# Patient Record
Sex: Male | Born: 1960 | Race: White | Hispanic: No | Marital: Single | Smoking: Never smoker
Health system: Southern US, Community
[De-identification: ages and names within clinical notes are randomized; demographics above are authoritative.]

## PROBLEM LIST (undated history)

## (undated) DIAGNOSIS — E119 Type 2 diabetes mellitus without complications: Secondary | ICD-10-CM

## (undated) DIAGNOSIS — I499 Cardiac arrhythmia, unspecified: Secondary | ICD-10-CM

## (undated) DIAGNOSIS — F209 Schizophrenia, unspecified: Secondary | ICD-10-CM

## (undated) DIAGNOSIS — I10 Essential (primary) hypertension: Secondary | ICD-10-CM

## (undated) DIAGNOSIS — R251 Tremor, unspecified: Secondary | ICD-10-CM

## (undated) DIAGNOSIS — G4730 Sleep apnea, unspecified: Secondary | ICD-10-CM

## (undated) DIAGNOSIS — K219 Gastro-esophageal reflux disease without esophagitis: Secondary | ICD-10-CM

## (undated) DIAGNOSIS — F329 Major depressive disorder, single episode, unspecified: Secondary | ICD-10-CM

## (undated) DIAGNOSIS — F32A Depression, unspecified: Secondary | ICD-10-CM

## (undated) DIAGNOSIS — E7800 Pure hypercholesterolemia, unspecified: Secondary | ICD-10-CM

## (undated) HISTORY — DX: Essential (primary) hypertension: I10

## (undated) HISTORY — DX: Schizophrenia, unspecified: F20.9

## (undated) HISTORY — DX: Pure hypercholesterolemia, unspecified: E78.00

## (undated) HISTORY — DX: Type 2 diabetes mellitus without complications: E11.9

## (undated) HISTORY — DX: Tremor, unspecified: R25.1

## (undated) HISTORY — PX: CARDIOVERSION: SHX1299

## (undated) HISTORY — DX: Cardiac arrhythmia, unspecified: I49.9

---

## 2004-08-27 ENCOUNTER — Ambulatory Visit: Payer: Self-pay | Admitting: Family Medicine

## 2004-09-01 ENCOUNTER — Ambulatory Visit: Payer: Self-pay | Admitting: Family Medicine

## 2004-10-02 ENCOUNTER — Ambulatory Visit: Payer: Self-pay | Admitting: Family Medicine

## 2004-11-01 ENCOUNTER — Ambulatory Visit: Payer: Self-pay | Admitting: Family Medicine

## 2007-02-25 ENCOUNTER — Other Ambulatory Visit: Payer: Self-pay

## 2007-02-25 ENCOUNTER — Ambulatory Visit: Payer: Self-pay | Admitting: Internal Medicine

## 2007-03-07 ENCOUNTER — Ambulatory Visit: Payer: Self-pay | Admitting: Internal Medicine

## 2008-01-30 ENCOUNTER — Ambulatory Visit: Payer: Self-pay | Admitting: Internal Medicine

## 2009-07-02 ENCOUNTER — Ambulatory Visit: Payer: Self-pay | Admitting: Internal Medicine

## 2010-07-30 ENCOUNTER — Ambulatory Visit: Payer: Self-pay | Admitting: Internal Medicine

## 2011-04-10 ENCOUNTER — Emergency Department: Payer: Self-pay | Admitting: Emergency Medicine

## 2011-10-27 ENCOUNTER — Inpatient Hospital Stay: Payer: Self-pay | Admitting: Internal Medicine

## 2011-10-27 LAB — COMPREHENSIVE METABOLIC PANEL
Albumin: 3.7 g/dL (ref 3.4–5.0)
Bilirubin,Total: 0.7 mg/dL (ref 0.2–1.0)
Glucose: 114 mg/dL — ABNORMAL HIGH (ref 65–99)
Osmolality: 283 (ref 275–301)
Potassium: 3.5 mmol/L (ref 3.5–5.1)
SGOT(AST): 33 U/L (ref 15–37)
SGPT (ALT): 25 U/L
Sodium: 138 mmol/L (ref 136–145)
Total Protein: 7.7 g/dL (ref 6.4–8.2)

## 2011-10-27 LAB — DRUG SCREEN, URINE
Amphetamines, Ur Screen: NEGATIVE (ref ?–1000)
Barbiturates, Ur Screen: NEGATIVE (ref ?–200)
Benzodiazepine, Ur Scrn: POSITIVE (ref ?–200)
Cocaine Metabolite,Ur ~~LOC~~: NEGATIVE (ref ?–300)
MDMA (Ecstasy)Ur Screen: NEGATIVE (ref ?–500)
Opiate, Ur Screen: NEGATIVE (ref ?–300)
Phencyclidine (PCP) Ur S: NEGATIVE (ref ?–25)
Tricyclic, Ur Screen: POSITIVE (ref ?–1000)

## 2011-10-27 LAB — URINALYSIS, COMPLETE
Blood: NEGATIVE
Glucose,UR: NEGATIVE mg/dL (ref 0–75)
Granular Cast: 10
Leukocyte Esterase: NEGATIVE
Ph: 5 (ref 4.5–8.0)
Protein: 100
Specific Gravity: 1.032 (ref 1.003–1.030)
Squamous Epithelial: 2
Transitional Epi: 3
WBC UR: 9 /HPF (ref 0–5)

## 2011-10-27 LAB — CBC
HCT: 36.8 % — ABNORMAL LOW (ref 40.0–52.0)
HGB: 12.4 g/dL — ABNORMAL LOW (ref 13.0–18.0)
MCH: 32.2 pg (ref 26.0–34.0)
MCV: 95 fL (ref 80–100)
RBC: 3.86 10*6/uL — ABNORMAL LOW (ref 4.40–5.90)
RDW: 13.6 % (ref 11.5–14.5)

## 2011-10-27 LAB — VALPROIC ACID LEVEL: Valproic Acid: 119 ug/mL — ABNORMAL HIGH

## 2011-10-28 LAB — BASIC METABOLIC PANEL
BUN: 28 mg/dL — ABNORMAL HIGH (ref 7–18)
Calcium, Total: 8.2 mg/dL — ABNORMAL LOW (ref 8.5–10.1)
Chloride: 106 mmol/L (ref 98–107)
Creatinine: 2.86 mg/dL — ABNORMAL HIGH (ref 0.60–1.30)
EGFR (African American): 28 — ABNORMAL LOW
EGFR (Non-African Amer.): 24 — ABNORMAL LOW
Glucose: 99 mg/dL (ref 65–99)
Osmolality: 287 (ref 275–301)
Potassium: 3.4 mmol/L — ABNORMAL LOW (ref 3.5–5.1)
Sodium: 141 mmol/L (ref 136–145)

## 2011-10-28 LAB — CBC WITH DIFFERENTIAL/PLATELET
Basophil #: 0 10*3/uL (ref 0.0–0.1)
Basophil %: 0.5 %
Eosinophil %: 1.8 %
HCT: 33.2 % — ABNORMAL LOW (ref 40.0–52.0)
HGB: 11.2 g/dL — ABNORMAL LOW (ref 13.0–18.0)
Lymphocyte #: 0.9 10*3/uL — ABNORMAL LOW (ref 1.0–3.6)
Lymphocyte %: 17.6 %
MCH: 31.9 pg (ref 26.0–34.0)
MCHC: 33.6 g/dL (ref 32.0–36.0)
Monocyte #: 0.8 x10 3/mm (ref 0.2–1.0)
Monocyte %: 15.1 %
Neutrophil #: 3.5 10*3/uL (ref 1.4–6.5)
Neutrophil %: 65 %
Platelet: 83 10*3/uL — ABNORMAL LOW (ref 150–440)

## 2011-10-28 LAB — HEMOGLOBIN A1C: Hemoglobin A1C: 5.8 % (ref 4.2–6.3)

## 2011-10-29 LAB — CBC WITH DIFFERENTIAL/PLATELET
Basophil %: 0.4 %
HCT: 30.8 % — ABNORMAL LOW (ref 40.0–52.0)
HGB: 10.6 g/dL — ABNORMAL LOW (ref 13.0–18.0)
Lymphocyte #: 0.8 10*3/uL — ABNORMAL LOW (ref 1.0–3.6)
Lymphocyte %: 23.7 %
MCH: 32.6 pg (ref 26.0–34.0)
MCV: 95 fL (ref 80–100)
Monocyte %: 15.5 %
Neutrophil #: 1.9 10*3/uL (ref 1.4–6.5)
Neutrophil %: 57.9 %
Platelet: 65 10*3/uL — ABNORMAL LOW (ref 150–440)
RDW: 13.9 % (ref 11.5–14.5)

## 2011-10-29 LAB — AMMONIA: Ammonia, Plasma: <25 mcmol/L (ref 11–32)

## 2011-10-29 LAB — COMPREHENSIVE METABOLIC PANEL
Albumin: 3 g/dL — ABNORMAL LOW (ref 3.4–5.0)
Alkaline Phosphatase: 90 U/L (ref 50–136)
Calcium, Total: 8.4 mg/dL — ABNORMAL LOW (ref 8.5–10.1)
EGFR (African American): 39 — ABNORMAL LOW
Osmolality: 280 (ref 275–301)
Potassium: 3.4 mmol/L — ABNORMAL LOW (ref 3.5–5.1)
SGOT(AST): 51 U/L — ABNORMAL HIGH (ref 15–37)
Sodium: 140 mmol/L (ref 136–145)
Total Protein: 6.2 g/dL — ABNORMAL LOW (ref 6.4–8.2)

## 2011-10-30 ENCOUNTER — Ambulatory Visit: Payer: Self-pay | Admitting: Internal Medicine

## 2011-10-30 LAB — BASIC METABOLIC PANEL
BUN: 11 mg/dL (ref 7–18)
Calcium, Total: 8.5 mg/dL (ref 8.5–10.1)
Chloride: 106 mmol/L (ref 98–107)
Co2: 24 mmol/L (ref 21–32)
Creatinine: 1.86 mg/dL — ABNORMAL HIGH (ref 0.60–1.30)
EGFR (African American): 47 — ABNORMAL LOW
EGFR (Non-African Amer.): 41 — ABNORMAL LOW
Potassium: 3.5 mmol/L (ref 3.5–5.1)
Sodium: 141 mmol/L (ref 136–145)

## 2011-10-30 LAB — CBC WITH DIFFERENTIAL/PLATELET
Basophil #: 0 10*3/uL (ref 0.0–0.1)
Basophil %: 0.5 %
Eosinophil #: 0.1 10*3/uL (ref 0.0–0.7)
HCT: 32.3 % — ABNORMAL LOW (ref 40.0–52.0)
HGB: 11 g/dL — ABNORMAL LOW (ref 13.0–18.0)
MCH: 32.6 pg (ref 26.0–34.0)
MCV: 96 fL (ref 80–100)
Monocyte %: 17.7 %
Neutrophil %: 56 %
Platelet: 78 10*3/uL — ABNORMAL LOW (ref 150–440)
RBC: 3.38 10*6/uL — ABNORMAL LOW (ref 4.40–5.90)
RDW: 14 % (ref 11.5–14.5)

## 2011-10-31 LAB — CBC WITH DIFFERENTIAL/PLATELET
Comment - H1-Com1: NORMAL
Comment - H1-Com2: NORMAL
HGB: 11.3 g/dL — ABNORMAL LOW (ref 13.0–18.0)
Lymphocytes: 14 %
MCH: 32.7 pg (ref 26.0–34.0)
MCHC: 34.2 g/dL (ref 32.0–36.0)
Monocytes: 9 %
Platelet: 80 10*3/uL — ABNORMAL LOW (ref 150–440)
RDW: 13.7 % (ref 11.5–14.5)
WBC: 4.4 10*3/uL (ref 3.8–10.6)

## 2011-10-31 LAB — COMPREHENSIVE METABOLIC PANEL
Albumin: 3.3 g/dL — ABNORMAL LOW (ref 3.4–5.0)
Anion Gap: 10 (ref 7–16)
Calcium, Total: 8.8 mg/dL (ref 8.5–10.1)
Creatinine: 1.88 mg/dL — ABNORMAL HIGH (ref 0.60–1.30)
Potassium: 4.1 mmol/L (ref 3.5–5.1)
SGOT(AST): 58 U/L — ABNORMAL HIGH (ref 15–37)
Sodium: 141 mmol/L (ref 136–145)
Total Protein: 6.9 g/dL (ref 6.4–8.2)

## 2011-10-31 LAB — IRON AND TIBC
Iron Bind.Cap.(Total): 301 ug/dL (ref 250–450)
Iron Saturation: 21 %
Iron: 63 ug/dL — ABNORMAL LOW (ref 65–175)
Unbound Iron-Bind.Cap.: 238 ug/dL

## 2011-10-31 LAB — FIBRINOGEN: Fibrinogen: 247 mg/dL (ref 210–470)

## 2011-10-31 LAB — PROTIME-INR
INR: 1.3
Prothrombin Time: 16.9 secs — ABNORMAL HIGH (ref 11.5–14.7)

## 2011-10-31 LAB — APTT: Activated PTT: 26.7 secs (ref 23.6–35.9)

## 2011-10-31 LAB — FIBRIN DEGRADATION PROD.(ARMC ONLY): Fibrin Degradation Prod.: <10 (ref 2.1–7.7)

## 2011-11-01 LAB — URINALYSIS, COMPLETE
Bilirubin,UR: NEGATIVE
Nitrite: POSITIVE
Ph: 5 (ref 4.5–8.0)
Protein: 30
RBC,UR: 68 /HPF (ref 0–5)

## 2011-11-01 LAB — SEDIMENTATION RATE: Erythrocyte Sed Rate: 20 mm/hr (ref 0–20)

## 2011-11-02 ENCOUNTER — Ambulatory Visit: Payer: Self-pay | Admitting: Internal Medicine

## 2011-11-02 LAB — CBC WITH DIFFERENTIAL/PLATELET
Basophil #: 0 10*3/uL (ref 0.0–0.1)
Basophil %: 0.6 %
Eosinophil %: 3.5 %
HCT: 30.6 % — ABNORMAL LOW (ref 40.0–52.0)
HGB: 10.4 g/dL — ABNORMAL LOW (ref 13.0–18.0)
Lymphocyte %: 15.3 %
MCV: 96 fL (ref 80–100)
Monocyte %: 16.9 %
Neutrophil #: 2.8 10*3/uL (ref 1.4–6.5)
RBC: 3.19 10*6/uL — ABNORMAL LOW (ref 4.40–5.90)
WBC: 4.3 10*3/uL (ref 3.8–10.6)

## 2011-11-02 LAB — COMPREHENSIVE METABOLIC PANEL
Alkaline Phosphatase: 129 U/L (ref 50–136)
Bilirubin,Total: 0.7 mg/dL (ref 0.2–1.0)
Chloride: 108 mmol/L — ABNORMAL HIGH (ref 98–107)
Co2: 25 mmol/L (ref 21–32)
Creatinine: 2 mg/dL — ABNORMAL HIGH (ref 0.60–1.30)
EGFR (African American): 43 — ABNORMAL LOW
Glucose: 106 mg/dL — ABNORMAL HIGH (ref 65–99)
Osmolality: 279 (ref 275–301)
SGOT(AST): 82 U/L — ABNORMAL HIGH (ref 15–37)
SGPT (ALT): 90 U/L — ABNORMAL HIGH

## 2011-11-03 LAB — CBC WITH DIFFERENTIAL/PLATELET
Basophil #: 0 10*3/uL (ref 0.0–0.1)
Eosinophil #: 0.1 10*3/uL (ref 0.0–0.7)
HCT: 30.4 % — ABNORMAL LOW (ref 40.0–52.0)
HGB: 10.5 g/dL — ABNORMAL LOW (ref 13.0–18.0)
Lymphocyte #: 1 10*3/uL (ref 1.0–3.6)
MCV: 96 fL (ref 80–100)
Neutrophil #: 2.4 10*3/uL (ref 1.4–6.5)
Neutrophil %: 51.2 %
Platelet: 99 10*3/uL — ABNORMAL LOW (ref 150–440)
RBC: 3.18 10*6/uL — ABNORMAL LOW (ref 4.40–5.90)
WBC: 4.6 10*3/uL (ref 3.8–10.6)

## 2011-11-03 LAB — COMPREHENSIVE METABOLIC PANEL
Albumin: 2.8 g/dL — ABNORMAL LOW (ref 3.4–5.0)
Anion Gap: 8 (ref 7–16)
Calcium, Total: 8 mg/dL — ABNORMAL LOW (ref 8.5–10.1)
Co2: 25 mmol/L (ref 21–32)
EGFR (African American): 45 — ABNORMAL LOW
EGFR (Non-African Amer.): 39 — ABNORMAL LOW
Potassium: 3.9 mmol/L (ref 3.5–5.1)
SGOT(AST): 69 U/L — ABNORMAL HIGH (ref 15–37)
SGPT (ALT): 90 U/L — ABNORMAL HIGH
Sodium: 140 mmol/L (ref 136–145)

## 2011-11-18 ENCOUNTER — Ambulatory Visit: Payer: Self-pay | Admitting: Internal Medicine

## 2011-11-18 LAB — CBC CANCER CENTER
Basophil %: 0.4 %
Eosinophil %: 4 %
HCT: 36.2 % — ABNORMAL LOW (ref 40.0–52.0)
HGB: 11.9 g/dL — ABNORMAL LOW (ref 13.0–18.0)
Lymphocyte #: 1.2 x10 3/mm (ref 1.0–3.6)
MCH: 31.1 pg (ref 26.0–34.0)
MCV: 95 fL (ref 80–100)
Monocyte #: 1 x10 3/mm (ref 0.2–1.0)
Monocyte %: 17.3 %
Neutrophil #: 3.1 x10 3/mm (ref 1.4–6.5)

## 2011-11-20 ENCOUNTER — Emergency Department: Payer: Self-pay | Admitting: *Deleted

## 2011-12-03 ENCOUNTER — Ambulatory Visit: Payer: Self-pay | Admitting: Internal Medicine

## 2013-06-15 ENCOUNTER — Ambulatory Visit: Payer: Self-pay | Admitting: Neurology

## 2013-06-19 ENCOUNTER — Encounter: Payer: Self-pay | Admitting: Neurology

## 2013-06-19 ENCOUNTER — Ambulatory Visit (INDEPENDENT_AMBULATORY_CARE_PROVIDER_SITE_OTHER): Payer: Medicare Other | Admitting: Neurology

## 2013-06-19 VITALS — BP 106/74 | HR 68 | Resp 16 | Ht 72.0 in | Wt 282.2 lb

## 2013-06-19 DIAGNOSIS — G219 Secondary parkinsonism, unspecified: Secondary | ICD-10-CM

## 2013-06-19 DIAGNOSIS — F209 Schizophrenia, unspecified: Secondary | ICD-10-CM

## 2013-06-19 DIAGNOSIS — G2401 Drug induced subacute dyskinesia: Secondary | ICD-10-CM

## 2013-06-19 DIAGNOSIS — G212 Secondary parkinsonism due to other external agents: Secondary | ICD-10-CM | POA: Insufficient documentation

## 2013-06-19 DIAGNOSIS — T43591A Poisoning by other antipsychotics and neuroleptics, accidental (unintentional), initial encounter: Secondary | ICD-10-CM

## 2013-06-19 DIAGNOSIS — T43505A Adverse effect of unspecified antipsychotics and neuroleptics, initial encounter: Secondary | ICD-10-CM

## 2013-06-19 NOTE — Progress Notes (Signed)
Lawrence Johnston was seen today in the movement disorders clinic for neurologic consultation at the request of Dr. Melrose Nakayama.  His PCP is  SPARKS,JEFFREY D, MD.  The consultation is for the evaluation of tremor and to see if he would be a DBS candidate.  The records that were made available to me were reviewed.   A caregiver from a group home comes with him.   The pt reports that tremor started several years ago (at least since 2006), per a caregiver that comes with him today.  Per the caregiver, he was placed on a "psychotropic" medication that caused tremor and even though that medication was d/c, the tremor persisted and actually has gotten worse with time.  He is having trouble eating and is spilling food on himself because of the tremor.  According to Dr. Melrose Nakayama records, the pt has been on abilify, saphril and seroquel and is currently on haloperidol.  He is also on VPA, carbidopa/levodopa tid, requip at bedtime, and 2 beta blockers (metoprolol and propranolol).    Specific Symptoms:  Tremor: yes Voice: no changes Sleep:   Vivid Dreams:  yes  Acting out dreams:  yes per caregiver (screams out) Wet Pillows: no Postural symptoms:  no  Falls?  no Bradykinesia symptoms: no bradykinesia noted Loss of smell:  no Loss of taste:  no Urinary Incontinence:  no Difficulty Swallowing:  no Handwriting, micrographia: no (gotten messy because of tremor) Trouble with ADL's:  no  Trouble buttoning clothing: no Depression:  no but admits to anxiety Memory changes:  no Hallucinations:  yes (black figures).  Admits to long standing auditory hallucinations  visual distortions: yes N/V:  no Lightheaded:  no  Syncope: no Diplopia:  no  PREVIOUS MEDICATIONS: Saphris, abilify, seroquel, haldol  ALLERGIES:  No Known Allergies  CURRENT MEDICATIONS:  List reviewed, updated system.  PAST MEDICAL HISTORY:   Past Medical History  Diagnosis Date  . Hypercholesteremia   . Tremor   . Diabetes   .  Arrhythmia   . Schizophrenia   . Hypertension     PAST SURGICAL HISTORY:   Past Surgical History  Procedure Laterality Date  . Cardiac surgery      x2    SOCIAL HISTORY:   History   Social History  . Marital Status: Single    Spouse Name: N/A    Number of Children: N/A  . Years of Education: N/A   Occupational History  . Not on file.   Social History Main Topics  . Smoking status: Never Smoker   . Smokeless tobacco: Not on file  . Alcohol Use: No  . Drug Use: No  . Sexual Activity: Not on file   Other Topics Concern  . Not on file   Social History Narrative  . No narrative on file    FAMILY HISTORY:   Family Status  Relation Status Death Age  . Mother Alive   . Father Alive     ROS:  A complete 10 system review of systems was obtained and was unremarkable apart from what is mentioned above.  PHYSICAL EXAMINATION:    VITALS:   Filed Vitals:   06/19/13 0843  BP: 106/74  Pulse: 68  Resp: 16  Height: 6' (1.829 m)  Weight: 282 lb 3 oz (127.999 kg)    GEN:  The patient appears stated age and is in NAD. HEENT:  Normocephalic, atraumatic.  The mucous membranes are dry.  There is movement of the tongue (generally in  the mouth but will protrude out of the mouth with distraction procedures). The superficial temporal arteries are without ropiness or tenderness. CV:  RRR Lungs:  CTAB Neck/HEME:  There are no carotid bruits bilaterally.  Neurological examination:  Orientation: The patient is alert and oriented x3. However, he has trouble with details of the hx.  Cannot name months in reverse order.  Can do simple math. Cranial nerves: There is good facial symmetry. Pupils are equal round and reactive to light bilaterally. Fundoscopic exam reveals clear margins bilaterally. Extraocular muscles are intact. The visual fields are full to confrontational testing. The speech is fluent and clear. Soft palate rises symmetrically and there is no tongue deviation. Hearing  is intact to conversational tone. Sensation: Sensation is intact to light and pinprick throughout (facial, trunk, extremities).There is no extinction with double simultaneous stimulation. There is no sensory dermatomal level identified. Motor: Strength is 5/5 in the bilateral upper and lower extremities.   Shoulder shrug is equal and symmetric.  There is no pronator drift. Deep tendon reflexes: Deep tendon reflexes are 0-1/4 at the bilateral biceps, triceps, brachioradialis, patella and achilles. Plantar responses are downgoing bilaterally.  Movement examination: Tone: There is incresed tone in the right upper extremity, overall mild.  Tone in the LUE is normal.  The tone in the lower extremities is normal.  Abnormal movements: There is a resting tremor in the b/l UE, R >L.  Occ LLE resting tremor. Coordination:  There is no significant decremation with RAM's, including alternating supination and pronation of the forearm, hand opening and closing, finger taps, heel taps and toe taps. Gait and Station: The patient has minor difficulty arising out of a deep-seated chair without the use of the hands. It takes 2 attempts.  He is wide based.  The patient's stride length is normal with decreased arm swing.      ASSESSMENT/PLAN:  1.  Parkinsonism.  -This is due to antipsychotic medication usage, currently haldol.  He has been on several others including saphris, seroquel and abilify.  All of these have D2 receptor blockade (although seroquel to a much lower extent).  If it is possible from a psychiatric standpoint, then clozaril would be a better choice, although would require weekly lab work.  I will certainly leave this to the discretion of his psychiatrist.  If he is able to get off of the other antipsychotic medications, then it will take 6 months for [place] to hopefully see resolution of the tremor.  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  -Depakote may contribute to tremor as  well, although I do not think it is contributing like the other medications.  -I doubt that the carbidopa/levodopa and Requip are making much of a difference currently, as he is on medications that block the D2 receptors.  If he is able, I might recommend decreasing and stopping these medications.  He is going to talk with this further with Dr. Melrose Nakayama.  -He is currently on 2 beta blocker medications; I suspect that one is likely for the heart and one is likely for tremor (propranolol).  If he is able, it might be advantageous to try and use and maximize one of these medications instead of both.  -He is not a DBS candidate as tremor is from other medications; in addition, his significant psychiatric hx would preclude DBS.  -He will f/u prn.  It is easier and more convenient for him to f/u with Dr. Melrose Nakayama.  However, I  think that most of the work regarding tremor and management is going to have to start with his psychiatrist and I wrote him a note today and gave it to the pt to give his psychiatrist.    Thank you, Thedore Mins, for allowing me to participate in the care of this pt.  Call me with questions or concerns!

## 2014-08-03 ENCOUNTER — Emergency Department
Admit: 2014-08-03 | Disposition: A | Discharge status: Home / Self Care | Payer: Self-pay | Admitting: Emergency Medicine

## 2014-08-03 LAB — COMPREHENSIVE METABOLIC PANEL
ALBUMIN: 3.9 g/dL
ALK PHOS: 79 U/L
Anion Gap: 7 (ref 7–16)
BILIRUBIN TOTAL: 0.5 mg/dL
BUN: 17 mg/dL
CALCIUM: 8.8 mg/dL — AB
CREATININE: 1.37 mg/dL — AB
Chloride: 110 mmol/L
Co2: 25 mmol/L
EGFR (Non-African Amer.): 58 — ABNORMAL LOW
GLUCOSE: 128 mg/dL — AB
Potassium: 4 mmol/L
SGOT(AST): 25 U/L
SGPT (ALT): 15 U/L — ABNORMAL LOW
Sodium: 142 mmol/L
Total Protein: 7.1 g/dL

## 2014-08-03 LAB — DRUG SCREEN, URINE
Amphetamines, Ur Screen: NEGATIVE
Barbiturates, Ur Screen: NEGATIVE
Benzodiazepine, Ur Scrn: POSITIVE
COCAINE METABOLITE, UR ~~LOC~~: NEGATIVE
Cannabinoid 50 Ng, Ur ~~LOC~~: NEGATIVE
MDMA (Ecstasy)Ur Screen: NEGATIVE
METHADONE, UR SCREEN: NEGATIVE
Opiate, Ur Screen: NEGATIVE
PHENCYCLIDINE (PCP) UR S: NEGATIVE
TRICYCLIC, UR SCREEN: POSITIVE

## 2014-08-03 LAB — CBC
HCT: 37.3 % — ABNORMAL LOW (ref 40.0–52.0)
HGB: 12.5 g/dL — AB (ref 13.0–18.0)
MCH: 31.1 pg (ref 26.0–34.0)
MCHC: 33.4 g/dL (ref 32.0–36.0)
MCV: 93 fL (ref 80–100)
Platelet: 173 10*3/uL (ref 150–440)
RBC: 4 10*6/uL — AB (ref 4.40–5.90)
RDW: 14.1 % (ref 11.5–14.5)
WBC: 5.1 10*3/uL (ref 3.8–10.6)

## 2014-08-03 LAB — URINALYSIS, COMPLETE
BILIRUBIN, UR: NEGATIVE
Bacteria: NONE SEEN
Blood: NEGATIVE
GLUCOSE, UR: NEGATIVE mg/dL (ref 0–75)
Hyaline Cast: 3
Ketone: NEGATIVE
LEUKOCYTE ESTERASE: NEGATIVE
NITRITE: NEGATIVE
Ph: 5 (ref 4.5–8.0)
Protein: NEGATIVE
RBC,UR: <1 /HPF (ref 0–5)
Specific Gravity: 1.02 (ref 1.003–1.030)

## 2014-08-03 LAB — ACETAMINOPHEN LEVEL

## 2014-08-03 LAB — SALICYLATE LEVEL: Salicylates, Serum: <4 mg/dL

## 2014-08-03 LAB — ETHANOL: Ethanol: <5 mg/dL

## 2014-08-03 LAB — VALPROIC ACID LEVEL: Valproic Acid: 94 ug/mL (ref 50–100)

## 2014-08-26 NOTE — Consult Note (Signed)
Primary Care Physician:  Idelle Crouch : [facility], [address] [place], [place] [address], [phone]  Past Medical/Surgical Hx:  Diabetes:   Hypertension:   Home Medications: Medication Instructions Last Modified Date/Time  Prozac 40 mg oral capsule 1 cap(s) orally once a day 25-Jun-13 11:56  hydrochlorothiazide 12.5 mg oral capsule 1 cap(s) orally once a day 25-Jun-13 11:56  multivitamin 1 tab(s) orally once a day 25-Jun-13 11:56  aspirin 325 mg oral tablet 1 tab(s) orally once a day 25-Jun-13 11:56  MiraLax oral powder for reconstitution 17 gram(s) (1 capful) in 8 ounces of water orally once a day 25-Jun-13 11:56  Lotrisone 1%-0.05% topical cream Apply topically to rash 2 times a day 25-Jun-13 11:56  PriLOSEC OTC 20 mg oral delayed release tablet 1 tab(s) orally 2 times a day 25-Jun-13 11:56  Glucophage 500 mg oral tablet 2 tab(s) orally 2 times a day 25-Jun-13 11:56  Depo-Provera 400 mg/mL intramuscular suspension 1 milliliter(s) intramuscular once a week on Tuesday 25-Jun-13 11:56  Latuda 80 mg oral tablet 1 tab(s) orally once a day (at bedtime) 25-Jun-13 11:56  Depakote 500 mg enteric coated tablet 4 tab(s) orally once a day (at bedtime) 25-Jun-13 11:56  Rozerem 8 mg oral tablet 1 tab(s) orally once a day (at bedtime), As Needed- for Inability to Sleep  25-Jun-13 11:56  Seroquel 100 mg oral tablet 1 tab(s) orally once a day (at bedtime) 25-Jun-13 11:56  Klonopin 1 mg tablet 3 tab(s) orally once a day (at bedtime) 25-Jun-13 11:56  Fibertab 625 mg oral tablet 2 tab(s) orally once a day (at bedtime) 25-Jun-13 11:56  Cogentin 1 milligram(s) orally 2 times a day, As Needed for tremors 25-Jun-13 11:56  metaxalone 800 mg oral tablet 1 tab(s) orally 3 times a day 25-Jun-13 11:56  chlorproMAZINE 25 mg oral tablet 1 tab(s) orally 2 times a day 25-Jun-13 11:56  Benadryl 25 mg capsule 1 cap(s) orally 2 times a day  25-Jun-13 11:56  metoprolol succinate 25 mg oral tablet,  extended release 1 tab(s) orally 2 times a day 25-Jun-13 11:56  Abilify 10 mg oral tablet 1 tab(s) orally once a day 25-Jun-13 11:56  naltrexone 50 mg oral tablet 2 tab(s) orally once a day 25-Jun-13 11:56  Crestor 20 mg oral tablet 1 tab(s) orally once a day (at bedtime) 25-Jun-13 11:56   Allergies:  No Known Allergies:   Vital Signs: **Vital Signs.:   29-Jun-13 20:01   Vital Signs Type POCT   Nurse Fingerstick (mg/dL) FSBS (fasting range 65-99 mg/dL) 113   Comments/Interventions  Nurse Notified   Lab Results:  Hepatic:  29-Jun-13 05:44    Bilirubin, Total  1.2   Alkaline Phosphatase 113   SGPT (ALT) 61 (12-78 NOTE: NEW REFERENCE RANGE 03/27/2011)   SGOT (AST)  58   Total Protein, Serum 6.9   Albumin, Serum  3.3  TDMs:  27-Jun-13 04:42    Valproic Acid, Serum  29 (50-100 POTENTIALLY TOXIC:  > 200 mcg/mL)  Routine Chem:  25-Jun-13 11:45    Ethanol, S. < 3   Ethanol % (comp) < 0.003 (Result(s) reported on 27 Oct 2011 at 01:26PM.)  26-Jun-13 05:01    Hemoglobin A1c [facility]) 5.8 (The American Diabetes Association recommends that a primary goal of therapy should be <7% and that physicians should reevaluate the treatment regimen in patients with HbA1c values consistently >8%.)  27-Jun-13 16:08    Ammonia, Plasma < 25 (Result(s) reported on 29 Oct 2011 at 04:47PM.)   Result Comment FOLATE -  RESULT OBTAINED BY DILUTION  Result(s) reported on 29 Oct 2011 at 76:81LX.   Folic Acid, Serum 72.6  29-Jun-13 05:44    Iron Binding Capacity (TIBC) 301   Unbound Iron Binding Capacity 238   Iron, Serum  63   Iron Saturation 21 (Result(s) reported on 31 Oct 2011 at 04:11PM.)   LDH, Serum  258 (Result(s) reported on 31 Oct 2011 at 07:08AM.)   Glucose, Serum  119   BUN 11   Creatinine (comp)  1.88   Sodium, Serum 141   Potassium, Serum 4.1   Chloride, Serum  108   CO2, Serum 23   Calcium (Total), Serum 8.8   Osmolality (calc) 282   eGFR (African American)  47   eGFR (Non-African  American)  40 (eGFR values <11m/min/1.73 m2 may be an indication of chronic kidney disease (CKD). Calculated eGFR is useful in patients with stable renal function. The eGFR calculation will not be reliable in acutely ill patients when serum creatinine is changing rapidly. It is not useful in  patients on dialysis. The eGFR calculation may not be applicable to patients at the low and high extremes of body sizes, pregnant women, and vegetarians.)   Anion Gap 10  Urine Drugs:  [id number]   Tricyclic Antidepressant, Ur Qual (comp) POSITIVE (Result(s) reported on 27 Oct 2011 at 04:42PM.)   Amphetamines, Urine Qual. NEGATIVE   MDMA, Urine Qual. NEGATIVE   Cocaine Metabolite, Urine Qual. NEGATIVE   Opiate, Urine qual NEGATIVE   Phencyclidine, Urine Qual. NEGATIVE   Cannabinoid, Urine Qual. NEGATIVE   Barbiturates, Urine Qual. NEGATIVE   Benzodiazepine, Urine Qual. POSITIVE (----------------- The URINE DRUG SCREEN provides only a preliminary, unconfirmed analytical test result and should not be used for non-medical  purposes.  Clinical consideration and professional judgment should be  applied to any positive drug screen result due to possible interfering substances.  A more specific alternate chemical method must be used in order to obtain a confirmed analytical result.  Gas chromatography/mass spectrometry (GC/MS) is the preferred confirmatory method.)   Methadone, Urine Qual. NEGATIVE  Cardiac:  25-Jun-13 11:45    CPK-MB, Serum 3.6 (Result(s) reported on 27 Oct 2011 at 01:26PM.)   Troponin I < 0.02 (0.00-0.05 0.05 ng/mL or less: NEGATIVE  Repeat testing in 3-6 hrs  if clinically indicated. >0.05 ng/mL: POTENTIAL  MYOCARDIAL INJURY. Repeat  testing in 3-6 hrs if  clinically indicated. NOTE: An increase or decrease  of 30% or more on serial  testing suggests a  clinically important change)  28-Jun-13 05:36    CK, Total 158 (Result(s) reported on 30 Oct 2011 at 06:59AM.)   Routine UA:  25-Jun-13 15:05    Color (UA) Amber   Clarity (UA) Cloudy   Glucose (UA) Negative   Bilirubin (UA) 1+   Ketones (UA) Trace   Specific Gravity (UA) 1.032   Blood (UA) Negative   pH (UA) 5.0   Protein (UA) 100 mg/dL   Nitrite (UA) Negative   Leukocyte Esterase (UA) Negative (Result(s) reported on 27 Oct 2011 at 04:01PM.)   RBC (UA) 9 /HPF   WBC (UA) 9 /HPF   Bacteria (UA) TRACE   Epithelial Cells (UA) 2 /HPF   Transitional Epithelial (UA) 3 /HPF   Mucous (UA) PRESENT   Hyaline Cast (UA) 8 /LPF   Granular Cast (UA) 10 /LPF (Result(s) reported on 27 Oct 2011 at 04:01PM.)  Routine Coag:  29-Jun-13 05:44    Prothrombin  16.9   INR 1.3 (  INR reference interval applies to patients on anticoagulant therapy. A single INR therapeutic range for coumarins is not optimal for all indications; however, the suggested range for most indications is 2.0 - 3.0. Exceptions to the INR Reference Range may include: Prosthetic heart valves, acute myocardial infarction, prevention of myocardial infarction, and combinations of aspirin and anticoagulant. The need for a higher or lower target INR must be assessed individually. Reference: The Pharmacology and Management of the Vitamin K  antagonists: the seventh ACCP Conference on Antithrombotic and Thrombolytic Therapy. LAGTX.6468 Sept:126 (3suppl): [id number]. A HCT value >55% may artifactually increase the PT.  In one study,  the increase was an average of 25%. Reference:  "Effect on Routine and Special Coagulation Testing Values of Citrate Anticoagulant Adjustment in Patients with High HCT Values." American Journal of Clinical Pathology 2006;126:400-405.)   Activated PTT (APTT) 26.7 (A HCT value >55% may artifactually increase the APTT. In one study, the increase was an average of 19%. Reference: "Effect on Routine and Special Coagulation Testing Values of Citrate Anticoagulant Adjustment in Patients with High HCT Values." American  Journal of Clinical Pathology 0321;224:825-003.)   Fibrin Degradation Products < 10 (Result(s) reported on 31 Oct 2011 at 06:39AM.)   Fibrinogen 247 (A HCT value >55% may artifactually increase the Fibrinogen.  In one study, the increase was an average of 10%. Reference:  "Effect of Routine and Special Coagulation Testing Values of Citrate Anticoagulant Adjustment in Patients with High HCT Values." American Journal of Clinical Pathology 2006;126:400-405.)  Routine Hem:  28-Jun-13 05:36    Neutrophil % 56.0   Lymphocyte % 23.7   Monocyte % 17.7   Eosinophil % 2.1   Basophil % 0.5   Neutrophil # 2.1   Lymphocyte #  0.9   Monocyte # 0.7   Eosinophil # 0.1   Basophil # 0.0 (Result(s) reported on 30 Oct 2011 at 07:03AM.)  29-Jun-13 05:44    Manual Diff MANUAL DIFF DONE  Result(s) reported on 31 Oct 2011 at 06:36AM.   WBC (CBC) 4.4   RBC (CBC)  3.44   Hemoglobin (CBC)  11.3   Hematocrit (CBC)  32.9   Platelet Count (CBC)  80 (Result(s) reported on 31 Oct 2011 at 07:08AM.)   MCV 96   MCH 32.7   MCHC 34.2   RDW 13.7   Segmented Neutrophils 75   Lymphocytes 14   Monocytes 9   Eosinophil 2   Diff Comment 1 RBCs APPEAR NORMAL   Diff Comment 2 NORMAL PLT MORPHOLGY  Result(s) reported on 31 Oct 2011 at 07:08AM.   Radiology Results: CT:    25-Jun-13 12:46, CT Head Without Contrast   CT Head Without Contrast    REASON FOR EXAM:    ams  COMMENTS:       PROCEDURE: CT  - CT HEAD WITHOUT CONTRAST  - Oct 27 2011 12:46PM     RESULT: Comparison:  04/10/2011    Technique: Multiple axial images from the foramen magnum to the vertex   were obtained without IV contrast.    Findings:    There is no evidence for mass effect, midline shift, or extra-axial fluid   collections. There is no evidence for space-occupying lesion,   intracranial hemorrhage, or cortical-based area of infarction.     The osseous structures areunremarkable.    IMPRESSION:    No acute intracranial  process.          Verified By: Gregor Hams, M.D., MD    29-Jun-13 17:02, CT  Head Without Contrast   CT Head Without Contrast    REASON FOR EXAM:    altered mental status  COMMENTS:       PROCEDURE: CT  - CT HEAD WITHOUT CONTRAST  - Oct 31 2011  5:02PM     RESULT: Comparison:  10/27/2011    Technique: Multiple axial images from the foramen magnum to the vertex   were obtained without IV contrast.    Findings:    There is no evidence for mass effect, midline shift, or extra-axial fluid   collections. There is no evidence for space-occupying lesion,   intracranial hemorrhage, or cortical-based area of infarction.     The osseous structures are unremarkable.    IMPRESSION:    No acute intracranial process.      Dictation Site: 8          Verified By: Gregor Hams, M.D., MD   Impression/Recommendations:  Recommendations:   Please see my dictation for details. [id number] Encephalopathy - stupor - flactuating psychotic behavior - hallucinations - aggression - myoclonus - hyperreflexia - admitted with dehydration (acute on chronic renal failure), elevated valproic acid, chronic thrombocytopenia, was thought due to medication side -effect (too many psychotropics)off prozac and other antipsychotics with gentle retroduction.No stool in 3-4 days - abdominal x-ray.Very poor PO intake due to mental status (unreliable intake of medications - spits meds out). h/o inappropriate behavior to woman and kids (aggressive and sexually inappropriate - per Dr. Nicolasa Ducking) - so long term necessary to be on valproic acid (VPA may be cause of his tremors and thrombocytopenia). - Off note he was on depo-provera ? - an unusual choice for medical castration - in pt with inappropriate behaviors to woman and kids.Congentin was the new medicine (for tremors by Dr. Kasandra Knudsen) but has been stopped and unlikely to be cause of his AMS this long after stopping his med.unlikely to be serotonin syndrome (has been off prozac since  admission, which was his long term med)Concern for neuroleptic malignant syndrome - buy no autonomic dynfuction, had clonus on exam but no real NMS like rigidity, CPK was not too high.Heat stroke (was out in this "heat" but should have improved by this time after hydration.)Tried MRI but can't do it due to movement. (Reviewed MRI brain from 2009 from frequent falls and ataxia - unremarkable)Repeated CT head (admission - CT head was OK)Extensive labs reviewed (TSH, folate, Vit B12, ammonia has been OK) (ordered ABG - hypercarbia?)EEG - when available - unlikely to be seizures.concern for OSA - no observed apnic spells by sitter but can consider continuous oxymetry. spent more than 85  minutes with this patient. More than 50% of the time was spent in counseling and co-ordination of care. Discussed at length with Dr. Nicolasa Ducking.for the opportunity to participate in care of your patient.will follow this patient with you during their hospitalization.   Electronic Signatures: Ray Church (MD)  (Signed 29-Jun-13 21:39)  Authored: Primary Care Physician, PAST MEDICAL/SURGICAL HISTORY, HOME MEDICATIONS, ALLERGIES, NURSING VITAL SIGNS, LAB RESULTS, RADIOLOGY RESULTS, Recommendations   Last Updated: 29-Jun-13 21:39 by Ray Church (MD)

## 2014-08-26 NOTE — Consult Note (Signed)
PATIENT NAME:  Lawrence Johnston, Lawrence Johnston MR#:  [id number] DATE OF BIRTH:  11/03/1960  DATE OF CONSULTATION:  10/28/2011  REFERRING PHYSICIAN:  Dr. Dustin Flock  CONSULTING PHYSICIAN:  Steva Colder. Nicolasa Ducking, MD  REASON FOR CONSULTATION: Confusion, altered mental status, possibly due to psychotropic medications.   IDENTIFYING INFORMATION: Lawrence Johnston is a 54 year old divorced Caucasian male currently living in [facility] for the past 4 to 5 years. He has two children.   HISTORY OF PRESENT ILLNESS: Lawrence Johnston is a 54 year old divorced Caucasian male with a prior diagnosis of schizoaffective disorder as well as multiple medical conditions including hypertension, diabetes, hyperlipidemia and atrial fibrillation who was brought to the Emergency Room with worsening confusion and altered mental status. Valproic acid level in the Emergency Room was elevated at 119 and patient also had elevated BUN and creatinine. He appeared to be dehydrated. CK was also elevated at 604 and QTc interval 505. The patient himself was unable to participate in the interview and was a very poor historian. He was disoriented to time, place, and situation and not able to answer questions appropriately. At times speech was mumbled. The patient's mother was available and at the bedside to provide some collateral information. She stated that recently one of his medications had been changed after he came back from seeing a psychiatrist and the patient has had a two week history of progressive confusion. The patient's mother does not know the psychiatrist's name. Multiple attempts were made to reach the [facility] care home to find out who the patient's psychiatrist was and find any collateral information. Unfortunately voice mail messages were left at the care home and no calls have been returned since. The patient himself does not appear to be actively psychotic at the present time but is disoriented. He is not verbalizing any current  suicidal thoughts. Per his mother there is a history of suicide attempts and prior inpatient psychiatric hospitalizations at [facility]: Per the patient's mother, the patient has been hospitalized at [facility]. He has had a history of cutting his wrists in the past. She is unsure who his outpatient psychiatrist is. FL-2 in the chart indicates that he is on Prozac 40 mg daily, Depakote 2000 mg at bedtime, Seroquel 100 mg at bedtime, Abilify 10 mg daily and Latuda 80 mg daily. In addition, he is also on Thorazine 25 mg p.o. b.i.d.   SUBSTANCE ABUSE HISTORY: Per the patient's mother there is no history of any heavy alcohol use or illicit drug use. Toxicology screen was positive for benzodiazepines although the patient did have Klonopin listed on his FL-2. No tobacco history.   FAMILY PSYCHIATRIC HISTORY: The patient's grandmother had ECT treatment at [facility] in the past.   PAST MEDICAL HISTORY:  1. Diabetes type 2.  2. Hypertension.  3. Hyperlipidemia.  4. Atrial fibrillation.  5. Hyperlipidemia.  6. There is no history of any prior seizures.   OUTPATIENT MEDICATIONS:  1. Abilify 10 mg p.o. daily. 2. Aspirin 325 mg p.o. daily. 3. Benadryl 25 mg p.o. b.i.d.  4. Thorazine 25 mg p.o. b.i.d.  5. Cogentin 1 mg b.i.d.  6. Crestor 20 mg p.o. daily. 7. Depakote 2000 mg at bedtime. 8. Vibra-Tabs 625 mg 2 tabs at bedtime. 9. Glucophage 1000 mg 2 times a day. 10. Hydrochlorothiazide 25 mg p.o. daily. 11. Klonopin 1 mg 3 tablets at bedtime.  12. Latuda 80 mg 1 tablet at bedtime. 13. Metaxalone 800 mg p.o. t.i.d.  14. Metoprolol 25 mg p.o. b.i.d.  15. MiraLax 17 grams daily with water. 16. Multivitamin 1 tab daily. 17. Naltrexone 100 mg p.o. daily. 18. Prilosec 20 mg p.o. daily. 19. Prozac 40 mg p.o. daily. 20. Rozerem 8 mg at bedtime p.r.n. for sleep.  21. Seroquel 100 mg p.o. at bedtime.   ALLERGIES: No known drug allergies.   SOCIAL  HISTORY: The patient himself was unable to provide any history. Per the patient's mother he was born and raised in the [place] area. 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 He did not like being in the [employer] and left after basic training. He is currently unemployed and on disability. He is in the [facility] care home for the past 4 to 5 years. The patient is divorced and has two children.   LEGAL HISTORY: There was arrest for shoplifting charge in the past per his mother.   MENTAL STATUS EXAM: Lawrence Johnston is 54 year old obese Caucasian male who was sitting in a chair outside of his room in the hallway of the hospital. He was alert but disoriented to time, place, and situation. Speech was mumbled at times. The patient was not able to answer questions appropriately. Affect was flat. He has not verbalized any suicidal thoughts and does not appear to be responding to internal stimuli. He denied any homicidal thoughts. Unable to fully assess for paranoid thoughts or delusions. Attention and concentration were poor. Judgment and insight were poor. He was unable to answer questions with regards to memory and recall.   SUICIDE RISK ASSESSMENT: At this time due to altered mental status the patient remains a low to moderate risk of harm to Johnston and others. He does have a history of suicide attempts in the past. He has not been verbalizing any suicidal thoughts, however.   REVIEW OF SYSTEMS: Patient unable to answer questions appropriately.   LABORATORY, DIAGNOSTIC, AND RADIOLOGICAL DATA: Sodium 141, potassium 3.4, chloride 106, CO2 26, BUN 28, creatinine 2.86, glucose 99. Valproic acid level currently 63 down from 119 at admission. CK 604. Troponin less than 0.02. Toxicology screen positive for benzodiazepines and tricyclic antidepressants. White blood cell count 5.4, hemoglobin 11.2, platelet count 83. Urinalysis showed trace bacteria, 9 WBCs, 2 epithelial cells, nitrite and  leukocyte esterase negative. EKG showed a QTc interval of 505 with a ventricular rate of 87 on admission, June 25.   DIAGNOSES:  AXIS I:  1. Delirium possibly from Depakote toxicity as well as multiple psychotropic medications. 2. Schizoaffective disorder per the patient's mother.   AXIS II: Deferred.    AXIS III:  1. Hypertension.  2. Hyperlipidemia.  3. Diabetes.  4. Atrial fibrillation.   AXIS IV: Severe inability to care for Johnston independently.   AXIS V: GAF at present equals 20.   ASSESSMENT AND TREATMENT RECOMMENDATIONS: Lawrence Johnston is a 54 year old divorced Caucasian male with multiple medical conditions as well as schizoaffective disorder brought to the Emergency Room with progressive confusion and lethargy. Valproic acid level was found be elevated in the Emergency Room and patient also was found to be dehydrated. At this time the patient is unable to provide any history. Multiple attempts have been made to gain some collateral information from [facility] care home although no one was there to answer the phone and messages has not been returned. Per his FL-2 he is on multiple psychotropic medications. It is unclear why he is on multiple low dose psychotropic medications. CK was elevated as well. In  addition, patient had Depakote toxicity. At this time will continue to hold all psychotropic medications and only use a p.r.n. Ativan if needed. Will need to repeat EKG due to prolonged QTc interval as well. Will also need to recheck CK. Will have to gain more thorough history when patient's mental status has improved. Will continue to follow the patient on a daily basis. Again, no psychotropic medications other than a p.r.n. Ativan for now secondary to elevated CPK as well as Depakote toxicity.  TIME SPENT: 80 minutes (mostly gaining collateral information from patients mother and group home) ____________________________ Steva Colder. Nicolasa Ducking, MD akk:cms D: 10/28/2011 12:16:21 ET T: 10/28/2011  13:29:44 ET JOB#: [id number]  cc: Maigan Bittinger K. Nicolasa Ducking, MD, <Dictator>  Chauncey Mann MD ELECTRONICALLY SIGNED 10/30/2011 9:23

## 2014-08-26 NOTE — Consult Note (Signed)
PATIENT NAME:  Lawrence Johnston, Lawrence Johnston MR#:  [id number] DATE OF BIRTH:  01-04-1961  DATE OF CONSULTATION:  10/31/2011  REFERRING PHYSICIAN:  Cephus Shelling, MD CONSULTING PHYSICIAN:  Lealer Marsland K. Manuella Ghazi, MD  REASON FOR CONSULTATION: Altered mental status and myoclonus.   HISTORY OF PRESENT ILLNESS: Lawrence Johnston is a 54 year old Caucasian gentleman with a history of schizoaffective disorder who lives in a group home for the last three to four years.   History was mainly obtained by reviewing the chart and discussing with Dr. Cephus Shelling.   The patient's mother is involved in the care, but was not available to discuss the care at bedside.   The patient is supposed to have cognitive changes for at least two weeks before the admission which was progressively getting worse.  He was started on a new medication, Cogentin, by Dr. Kasandra Knudsen, for worsening tremors.  The patient was supposed to go out of the group home and when he came back he was confused and not talking coherently and was mumbling and was brought to the ER.   Early on in his hospitalization course, he was found to be dehydrated with mild elevation of CPK, mild elevation of valproic acid level, prolonged QT time, etc., and was thought to have acute on chronic renal failure. Initially he was also thought to have multiple psychotropic medications that precipitated bad side effects, but after admission of 3 to 4 days he has not been getting better.  Last night he had aggressive behavior and was inappropriate with the nurses.  PAST MEDICAL HISTORY:  1. Diabetes. 2. Hypertension. 3. Schizoaffective disorder. 4. Hyperlipidemia.  5. Atrial fibrillation.   PAST SURGICAL HISTORY: Unknown.   MEDICATIONS: I reviewed his home medication list.   SOCIAL HISTORY: Significant for smoking, alcohol, and drug use episodes.   He lives in a group home.   The patient does have a history of aggressive behavior and inappropriate behavior to a woman and child, per Dr.  Vania Rea Kapur's note.   He also has a history of arrest for shoplifting, per his mother.   FAMILY HISTORY:  Not able to obtain.  REVIEW OF SYSTEMS: Not able to obtain.   PHYSICAL EXAMINATION:  Temperature 99.5, pulse 79, respiratory rate 18, blood pressure 156/86, and pulse oximetry 99%.   He is a morbidly obese Caucasian gentleman lying in a hospital bed, not in distress. He was not very arousable to me.   He would have sudden myoclonic jerks randomly with significant physical stimulation. He did mumble somethings, which was hard to interpret.   I was not able to keep him awake long enough.  He did not receive any medication that would sedate him recently, except Haldol.   The patient seemed to move all his four limbs spontaneously, per the sitter.   I could not check any higher cognitive function.   I could not do a proper language evaluation.   On his cranial nerves, his pupils were reactive, but his eyes had exotropia and he had positive Doll's eyes.  His neck was supple.   His face looked symmetric but he has poor teeth and that makes his lower face evaluation a little bit difficult.   But, I do not think he has focal facial weakness.   So I could not check his hearing or shoulder shrug.   On his motor examination, as I stated earlier, he does move all his four limbs. He had a clonus in both his hands, but he also will have myoclonus (  asterixis).  I did not feel like he had MS type of rigidity.   His temperature was borderline elevated.   I cannot check his sensory exam reliably but he did withdraw to pain.   He has generalized hyperreflexia. His toes were upgoing.   I cannot check his coordination or gait because of his decreased level of alertness.   His lungs had decreased air entry at the bases. He is morbidly obese in the stomach. His abdomen was soft. Heart had S1 and S2 heart sounds. Carotid exam did not reveal any bruit.   He does have multiple bruises on  his body (he does have thrombocytopenia). He also has a laceration, which is in old stage of healing.   REVIEW OF RADIOLOGICAL DATA: His CT scan of the head was unremarkable.  His MRI of the brain from 2009 was also unremarkable, which was done for ataxia and falls.   ASSESSMENT AND PLAN: Lawrence Johnston seems to have complicated encephalopathy. I still feel like this is toxic metabolic encephalopathy, but the cause is not quite clear.   The patient does have acute on chronic renal failure which seems to have improved. This can be coming from initially what was thought to be due to his medications, but they were held for awhile.   I have low suspicion for this being serotonin syndrome as he has been on Prozac for a very long period of time and Prozac has been stopped since admission.   There is concern for hypercarbia causing increased somnolence and he is at high risk of having sleep apnea based on his body habitus. I will check an ABG on him.   The patient also has not had any bowel movement since admission, but he also has a very poor p.o. intake. I will get abdominal x-ray and if he has stool impaction he will benefit from enema or oral medications.   At admission the patient's valproic acid was elevated and was thought to be the cause of his problems with tremors and thrombocytopenia, but now the level has come down but the patient is still encephalopathic.   At the time of admission, his CPK was a little bit evaluated which has come down.   But after some initiation of antipsychotics, I think his temperature is getting borderline and he has some rigidity so there is a concern for neuroleptic malignant syndrome.   Of note, the patient has a history of aggressive and sexually inappropriate behavior to women and kids and that is why he was probably given Depo-Provera for medical castration. Depo-Provera actually is an unusual choice for this reason.   Typically, estrogen supplement has been  given for this reason.   I will obtain a repeat CT scan of the head as repeated attempts for MRI of the brain has been unsuccessful due to his movements.   I reviewed his extensive laboratory work which did include a CBC, CMP, TSH, folate, vitamin B12, and ammonia. That has been okay except elevated creatinine. His CPK has come down and BUN has also come down. He does have some anemia.   I will obtain an EEG when available, but I have low suspicion that this represents seizure.   There is a concern for obstructive sleep apnea so we can obtain 24-hour pulse oximetry and see if he has episodes of hypoxia.   I spent extensive time talking about this patient with Dr. Nicolasa Ducking. Total care time spent for this patient was 85 minutes.   I  will try to talk to his mother when she is available.  I will follow this patient with you in the hospital. ____________________________ Neelam Tiggs K. Manuella Ghazi, MD hks:slb D: 10/31/2011 21:39:15 ET T: 11/01/2011 11:19:59 ET JOB#: [id number]  cc: Vikki Gains K. Manuella Ghazi, MD, <Dictator> Leonie Douglas. Doy Hutching, MD [place] MD ELECTRONICALLY SIGNED 11/13/2011 14:01

## 2014-08-26 NOTE — Consult Note (Signed)
PATIENT NAME:  Lawrence Johnston, RADZIEWICZ MR#:  [id number] DATE OF BIRTH:  Jan 25, 1961  DATE OF CONSULTATION:  10/30/2011  REFERRING PHYSICIAN:  Dr. Gilford Rile CONSULTING PHYSICIAN:  Estoria Geary R. Ma Hillock, MD  REASON FOR CONSULTATION: Thrombocytopenia.   HISTORY OF PRESENT ILLNESS: The patient is a 54 year old Caucasian male who has been admitted to the hospital on 06/25 with chief complaint of confusion. The patient has history of schizoaffective disorder on multiple medications, diabetes, hypertension, hyperlipidemia, atrial fibrillation. Medications include Depakote. The patient is also being followed by psychiatry. Also MRI of the brain has been planned but not done so far since he has been unable to be still. In addition, labs on admission had showed platelet count of 107 on 10/27/2011 with hemoglobin 12.4, WBC 7,700, valproate level was high at 119, creatinine was abnormal at 3.72, BUN 32. Liver functions unremarkable. Subsequent CBCs have shown platelet count was 83 on 06/26 and 78,000 today. The patient is a poor historian, answers simple questions only, denies any obvious bleeding symptoms. He remains afebrile with temperature of 98.2.   PAST MEDICAL/SURGICAL HISTORY: As in history of present illness.   FAMILY HISTORY: Unable to obtain from the patient.   SOCIAL HISTORY: Per chart record, no history of smoking, alcohol or recreational drug usage.   HOME MEDICATIONS:  1. Abilify 10 mg daily.  2. Aspirin 325 mg daily. 3. Chlorpromazine 25 mg b.i.d.  4. Benadryl 25 mg b.i.d.  5. Cogentin 1 mg b.i.d. p.r.n.  6. Crestor 20 mg daily.  7. Depakote 2,000 mg at bedtime. 8. Depo-Provera 1 mL IM q. weekly on Tuesday. 9. Glucophage 1,000 mg b.i.d.  10. HCTZ 25 mg daily. 11. Klonopin 1 mg 3 tablets at bedtime. 12. Latuda 80 mg p.o. at bedtime. 13. Lotrisone cream metaxalone 800 mg p.o. t.i.d.  14. Metoprolol succinate 25 mg twice a day. 15. Fiber tablets 625 mg 2 tablets at bedtime.  16. Multivitamin daily.   17. MiraLAX 17 grams daily.  18. Naltrexone 50 mg 2 tablets daily. 19. Prilosec 20 mg daily.  20. Prozac 40 mg daily.  21. Rozerem 8 grams at bedtime p.r.n.  22. Seroquel 100 mg at bedtime.   ALLERGIES: Per chart record, none known.   REVIEW OF SYSTEMS: The patient is a poor historian, does not give details.   PHYSICAL EXAMINATION:  GENERAL: The patient is well built and moderately nourished individual, awake and cooperative, no acute distress. He is disoriented to person, place, and time. No icterus or pallor.   VITAL SIGNS: Temperature 98.6, pulse 82, respirations 18, blood pressure 132/90, 97% on room air.   HEENT: Normocephalic, atraumatic. Extraocular movements intact. No oral thrush or petechiae.   NECK: Negative for lymphadenopathy.   CARDIOVASCULAR: S1, S2, regular rate and rhythm.   LUNGS: Bilateral good air entry.   ABDOMEN: Soft. No hepatosplenomegaly clinically.   EXTREMITIES: No major edema.   SKIN: No major bruising, rash or ecchymosis.   LYMPHATICS: No adenopathy in the neck, axilla or groin areas.   LABORATORY, DIAGNOSTIC, AND RADIOLOGICAL DATA: As in history of present illness.   IMPRESSION AND PLAN: 54 year old gentleman with schizoaffective disorder, diabetes, hypertension, on multiple medications as detailed above including Depakote, noted to have thrombocytopenia upon admission, which has slightly worsened to 107 to 78,000 today. Clinically, no obvious major bleeding symptoms. WBC count is down to the low normal range with normal absolute neutrophil count, mild anemia with hemoglobin of 11, MCV normal at 96. Also, had renal insufficiency on admission, creatinine is better, but  still at 1.86. Possible etiologies for thrombocytopenia include medication-induced (Depakote or others) versus other etiology, the patient also has received heparin for deep vein thrombosis prophylaxis, although he did have thrombocytopenia on admission and it is less likely to be HIT  (heparin-induced thrombocytopenia). Plan at this time is to get further work-up. We will check repeat CBC tomorrow along with PT/PTT/FDP/fibrinogen level to look for DIC/coagulopathy, LDH and haptoglobin, heparin-induced platelet antibody, HBsAg/HCV antibody/HIV antibody, manual differential. Otherwise, no immediate intervention needed for mild thrombocytopenia, continue to monitor for bleeding symptoms clinically. We will continue to follow.   Thank you for the referral. Please feel free to contact me if additional questions.     ____________________________ Rhett Bannister Ma Hillock, MD srp:ap D: 10/30/2011 23:30:00 ET T: 10/31/2011 08:13:44 ET JOB#: [id number]  cc: Jayin Derousse R. Ma Hillock, MD, <Dictator> Alveta Heimlich MD ELECTRONICALLY SIGNED 11/01/2011 17:41

## 2014-08-26 NOTE — Discharge Summary (Signed)
PATIENT NAME:  Lawrence Johnston, Lawrence Johnston MR#:  [id number] DATE OF BIRTH:  1960/06/15  DATE OF ADMISSION:  10/27/2011 DATE OF DISCHARGE:  11/03/2011   TYPE OF DISCHARGE: The patient is transferred to skilled nursing facility.   REASON FOR ADMISSION: Altered mental status/confusion.   HISTORY OF PRESENT ILLNESS: The patient is a 54 year old male with a history of diabetes, hypertension, atrial fibrillation, and schizoaffective disorder who was residing in a group home. He had gone on a family visit and returned. When he returned, the patient was confused and lethargic. He was brought to the Emergency Room where he was found to be severely dehydrated in acute renal failure. He was subsequently admitted for acute delirium, metabolic encephalopathy, and acute renal failure.   PAST MEDICAL HISTORY:  1. Schizoaffective disorder.  2. Type II diabetes.  3. Benign hypertension.  4. Hyperlipidemia.  5. Chronic atrial fibrillation.  6. Stage II chronic kidney disease.   MEDICATIONS ON ADMISSION: Please see admission note.   ALLERGIES: No known drug allergies.   SOCIAL HISTORY: The patient denied alcohol or smoking.   FAMILY HISTORY: Unable to obtain.   REVIEW OF SYSTEMS: Unable to obtain.   PHYSICAL EXAMINATION: The patient was lethargic and confused but in no acute distress. Vital signs were stable and he was afebrile. HEENT exam was unremarkable. NECK was supple without bruits. LUNGS were clear. CARDIAC exam revealed regular rate and rhythm, normal S1 and S2. ABDOMEN soft, nontender with normoactive bowel sounds. EXTREMITIES: Without edema. NEUROLOGIC exam was grossly nonfocal other than the patient's mental status.   HOSPITAL COURSE: The patient was admitted with acute delirium with dehydration, acute renal failure, and metabolic encephalopathy. He was started on IV fluids. All of his psychiatric medications were held. He was seen in consultation by Psychiatry and Neurology. His renal function improved  back to baseline with hydration. His mental status improved as well with hydration and with adjustment of his psychiatric medications. MRI of the brain was unremarkable. He did have some constipation in the hospital which was treated with magnesium citrate and MiraLAX with improvement of his symptoms. The patient's mental status returned to baseline. He was seen in consultation by physical therapy. Skilled nursing placement was recommended. By 11/03/2011, the patient was stable and ready for discharge to the skilled nursing facility.   DISCHARGE DIAGNOSES:  1. Acute delirium, resolved.  2. Dehydration with acute renal failure, resolved.  3. Stage II chronic kidney disease.  4. Schizoaffective disorder.  5. Type II diabetes.  6. Benign hypertension.  7. Hyperlipidemia.  8. Chronic atrial fibrillation.   DISCHARGE MEDICATIONS:  1. Lotrisone cream to rash b.i.d.  2. Harper's low dose sliding scale insulin with Accu-Cheks q.a.c. and at bedtime.  3. Toprol-XL 25 mg p.o. b.i.d.  4. Nephro-Vite 1 p.o. daily. 5. Protonix 40 mg p.o. daily.  6. Crestor 20 mg p.o. at bedtime.  7. Ativan 0.5 mg p.o. q.8 hours p.r.n. agitation.  8. Colace 100 mg p.o. b.i.d.  9. MiraLAX 17 grams p.o. b.i.d.  10. Haldol 2 mg p.o. q.6 hours p.r.n. agitation.  11. Haldol 2 mg p.o. b.i.d. scheduled at 9 a.m. and 5 p.m.  12. Seroquel 300 mg p.o. at bedtime.  13. Thiamine 100 mg p.o. daily.  14. Cipro 500 mg p.o. b.i.d. x1 week.  15. Depakote ER 1500 mg p.o. at bedtime.  16. Prozac 40 mg p.o. daily.  17. Oxygen at 2 liters per minute per nasal cannula.   18. Milk of Magnesia 30 mL p.o. q.12  hours p.r.n. constipation.   FOLLOW-UP PLANS AND APPOINTMENTS:  1. The patient will be followed by the resident physician at the skilled nursing facility.  2. He is on a 2 gram sodium, 1800 calorie ADA diet.  3. He will be seen in consultation by physical therapy.  4. Obtain a CBC and a MET-C in one week.  5. He will follow-up  with his psychiatrist within one week's time.  ____________________________ Leonie Douglas Doy Hutching, MD jds:drc D: 11/03/2011 07:49:09 ET T: 11/03/2011 08:08:35 ET JOB#: [id number]  cc: Leonie Douglas. Doy Hutching, MD, <Dictator> JEFFREY Lennice Sites MD ELECTRONICALLY SIGNED 11/03/2011 9:10

## 2014-08-26 NOTE — Consult Note (Signed)
HEMATOLOGY followup note - resting in bed, denies new Sxs. Per d/w nurse, no obvious bleeding Sxs.no fever. resting in bed comfortably, NAD      vitals - Tmax 99, stable      lungs - b/l good air entry      ext - no major bruising.- WBC 4300, Hb 10.4, platelets 88K, ANC 2800, Cr 2.0. Recent Bil 1.2, AST 58, albumin 3.3, LDH 258, PT/INR 1.3, PTT 26.7, Fibrinogen 247, FDP < 10. Haptoglobin, heparin-induced platelet antibody, HBsAg, HCV antibody, HIV antibody all unremarkable. 54 year old gentleman with schizoaffective disorder, diabetes, hypertension, on multiple medications as detailed above including Depakote, noted to have thrombocytopenia upon admission (was 107K and worsened to 78K). Likely etiology for thrombocytopenia includes medication-induced (Depakote or others) versus other etiology like ITP, workup otherwise is unremarkable for DIC and Haptoglobin, heparin-induced platelet antibody, HBsAg, HCV antibody, HIV antibody all unremarkable. Will get Direct platelet antibody test to look for ITP. Otherwise platelet count today is better at 88K, no bleeding Sxs.  WBC count is in normal range with normal absolute neutrophil count, mild anemia likely related to renal insufficiency. No immediate intervention needed for mild thrombocytopenia, continue to monitor for bleeding symptoms clinically. Will continue to follow.   Electronic Signatures: Jonn Shingles (MD)  (Signed on 02-Jul-13 08:14)  Authored  Last Updated: 02-Jul-13 08:14 by Jonn Shingles (MD)

## 2014-08-26 NOTE — Consult Note (Signed)
ONCOLOGY followup note - resting in bed, denies new Sxs. Per d/w nurse, no obvious bleeding Sxs.no fever. resting in bed comfortably, NAD      vitals - Tmax 99.6, stable      lungs - b/l good air entry      ext - no major bruising.- WBC 4400, Hb 11.3, platelets 80K, neutrophils 75%, Cr 1.88 (stable), Bil 1.2, AST 58, albumin 3.3, LDH 258, PT/INR 1.3, PTT 26.7, Fibrinogen 247, FDP < 61.  54 year old gentleman with schizoaffective disorder, diabetes, hypertension, on multiple medications as detailed above including Depakote, noted to have thrombocytopenia upon admission, which has slightly worsened to 107 to 78K, remains steady at 80K today. Clinically, no obvious major bleeding symptoms. WBC count is in low normal range with normal absolute neutrophil count, mild anemia. Also, had renal insufficiency on admission, creatinine is better, steady at 1.88. Possible etiologies for thrombocytopenia include medication-induced (Depakote or others) versus other etiology, the patient also has received heparin for deep vein thrombosis prophylaxis, although he did have thrombocytopenia on admission and it is less likely to be HIT (heparin-induced thrombocytopenia). No evidence of DIC. Other work-up is pending including haptoglobin, heparin-induced platelet antibody, HBsAg/HCV antibody/HIV antibody. Otherwise, no immediate intervention needed for mild thrombocytopenia, continue to monitor for bleeding symptoms clinically. Will continue to follow.  Electronic Signatures: Jonn Shingles (MD)  (Signed on 29-Jun-13 22:09)  Authored  Last Updated: 29-Jun-13 22:09 by Jonn Shingles (MD)

## 2014-08-26 NOTE — H&P (Signed)
PATIENT NAME:  Lawrence Johnston, Lawrence Johnston MR#:  [id number] DATE OF BIRTH:  10-05-1960  DATE OF ADMISSION:  10/27/2011  PRIMARY MD: Dr. Doy Hutching   ED REFERRING DOCTOR: Dr. Lovena Le    CHIEF COMPLAINT: Confusion.   HISTORY OF PRESENT ILLNESS: The patient is a 54 year old Caucasian male with history of diabetes, hypertension, schizoaffective disorder, hyperlipidemia, and atrial fibrillation who apparently resides in a group home. He was out when he returned back. It is unclear how long he was out for but staff stated that the patient was not acting normal, was confused and sleepy. Therefore, he was brought to the ED. In the ER, the patient was evaluated, had a CT scan of the head which was negative. He was noted to have a creatinine that was elevated. BUN was high. He appeared very dehydrated. His valproic acid level was also elevated. The patient does have a history of schizoaffective disorder and is on multiple psychiatric medications. Therefore, I am asked to admit the patient. The patient currently when I am evaluating him is not able to give me any history. He is confused. When asked when he had the last water to drink he states that he drank about a week ago. He thinks that he stays at home. He does not have any focal neurological deficits. He is not noted to be febrile. The rest of his other vitals appear stable. He is unable to give me any history.   PAST MEDICAL HISTORY:  1. Diabetes type II.  2. Hypertension.  3. Schizoaffective disorder.  4. Hyperlipidemia.  5. Atrial fibrillation.   PAST SURGICAL HISTORY: Unknown.   ALLERGIES: None.   MEDICATIONS:  1. Abilify 10 daily.  2. Aspirin 325 daily.  3. Benadryl 25 1 tab p.o. b.i.d.  4. Chlorpromazine 25 1 tab p.o. b.i.d.  5. Cogentin 1 mg 2 times per day as needed for tremors.  6. Crestor 20 daily.  7. Depakote 2000 mg at bedtime.  8. Depo-Provera 1 mL IM once a week on Tuesday. 9. Fiber tabs 625 mg 2 tabs at bedtime.  10. Glucophage 1000 mg 2  times per day. 11. HCTZ 25 p.o. daily.  12. Klonopin 1 mg 3 tabs at bedtime.  13. Latuda 80 mg 1 tab p.o. at bedtime.  14. Lotrisone 1%/0.05% topical cream to rash b.i.d.  15. Metaxalone 800 mg 1 tab p.o. t.i.d.  16. Metoprolol succinate 25 1 tab p.o. b.i.d.  17. MiraLAX 17 grams daily with water.  18. Multivitamins daily.  19. Naltrexone 50 mg 2 tabs daily.  20. Prilosec 20 mg daily.  21. Prozac 40 daily.  22. Rozerem 8 mg 1 tab p.o. at bedtime p.r.n. sleep.  23. Seroquel 100 p.o. at bedtime.   SOCIAL HISTORY: There is no history of smoking, alcohol, or drug use.   FAMILY HISTORY: He is unable to give me.  REVIEW OF SYSTEMS: Unobtainable due to his mental status.   PHYSICAL EXAMINATION:   VITAL SIGNS: Temperature 98, pulse 71, respirations 18, blood pressure 117/74, O2 94% on room air.   GENERAL: The patient is an obese male currently a little sleepy, does not answer questions appropriately.   HEENT: Head atraumatic, normocephalic. Pupils equally round, reactive to light and accommodation. There is no conjunctival pallor. No sclerae icterus. Unable to do extraocular movements intact. Nasal exam shows no drainage or ulceration. Oropharynx is clear without any exudate.   NECK: No thyromegaly. No carotid bruits.   CARDIOVASCULAR: Regular rate and rhythm. No murmurs, rubs, clicks, or gallops.  PMI nondisplaced.   LUNGS: Clear to auscultation bilaterally without any rales, rhonchi, or wheezing.   ABDOMEN: Soft, nontender, nondistended. Positive bowel sounds x4.   EXTREMITIES: No clubbing, cyanosis, or edema.   SKIN: No rash. Skin appears very dry.   LYMPH NODES: No lymph nodes palpable.   VASCULAR: Good DP, PT pulses.   PSYCHIATRIC: The patient is sleepy, not anxious currently or depressed.   NEUROLOGICAL: Cranial nerves II through XII grossly intact. The patient is not oriented to place, person, or time. Strength is 5 out of 5 in all four extremities. Reflexes 2+.    MUSCULOSKELETAL: There is no erythema or swelling.   EVALUATIONS IN THE ED: Blood glucose 114, BUN 32, creatinine 3.72, sodium 138, potassium 3.5, chloride 102, CO2 25, anion gap 11, calcium 8.3. Alcohol level less than 3. TUDS were positive for benzodiazepines, tricyclics. Valproic acid level was elevated at 119. WBC 7.7, hemoglobin 12.4, platelet count 107. Nitrites negative, leukocytes negative in the urinalysis.   CT scan of the head shows no acute intracranial processes.   Portable chest x-ray shows hypoinflation with basilar atelectasis.  Renal ultrasound was negative.   ASSESSMENT AND PLAN: The patient is a 54 year old white male who resides at [facility] who is sent for altered mental status, confusion. The patient is noted to have an elevated creatinine, lethargic.  1. Acute encephalopathy likely due to dehydration, acute renal failure, multiple psychiatric medications. At this time will hold all his psychiatric medications. Give him IV fluids. Monitor his mental status. If there is no improvement, then consider Neurology evaluation in the a.m. Will also check an ABG and make sure he is not retaining CO2. There is some question whether he has sleep apnea.  2. Acute renal failure. No baseline creatinine available. Will give him IV fluids. Follow renal function. Check renal ultrasound. If no improvement in renal function, consult Nephrology in the a.m.  3. Diabetes, type II, on metformin. Will get a hemoglobin A1c. Discontinue metformin. Place him on sliding scale insulin.  4. History of atrial fibrillation. Will continue him on metoprolol and monitor him on tele.  5. Schizoaffective disorder on multiple meds. Will hold all meds in light of his change in mental status.  6. Miscellaneous. Will place him on Lovenox for DVT prophylaxis.    TIME SPENT: 45 minutes.    ____________________________ Lafonda Mosses Posey Pronto, MD shp:drc D: 10/27/2011 19:29:39 ET T: 10/28/2011 06:58:14  ET JOB#: [id number]  cc: Lachlyn Vanderstelt H. Posey Pronto, MD, <Dictator> Leonie Douglas. Doy Hutching, MD Alric Seton MD ELECTRONICALLY SIGNED 10/29/2011 16:38

## 2014-09-02 NOTE — Consult Note (Signed)
PATIENT NAME:  Lawrence Johnston, Lawrence Johnston MR#:  [id number] DATE OF BIRTH:  1961/02/14  DATE OF CONSULTATION:  08/06/2014  CONSULTING PHYSICIAN:  Gonzella Lex, MD  HOSPITAL COURSE:  Mr. Guzzi is a 54 year old man with schizophrenia, who came into the hospital on April 1 with complaints of hallucinations and thoughts about wanting to hurt people. His mood had recently been more anxious. It was unclear what might have triggered this worsening. There was no bed available for admission and he has spent the weekend in the Emergency Room. On re-evaluation today, the patient said he is feeling much better. His mood is no longer nervous or upset. He completely denies any auditory or visual hallucinations. Completely denies any homicidal ideation whatsoever, specifically towards any women. No suicidal ideation. He says he feels physically fine and is comfortable about going back home again. He is not clear what might have made the change, but he tells me now that he thinks that possibly he got psychotic because he had been watching horror movies on television, a behavior that he plans to discontinue.   REVIEW OF SYSTEMS: The patient has complaints of a mild tremor, which has been chronic. No other physical complaints. Full other 9-point review of systems negative. Mentally, denies depression. Denies suicidal or homicidal ideation. Denies hallucinations.   MENTAL STATUS EXAMINATION: A somewhat disheveled gentleman who looks older than his stated age, cooperative with the interview. Good eye contact, mild tremor, otherwise normal psychomotor activity. Speech is slow, decreased in amount. Affect blunted. Mood stated as fine. Thoughts appear to be slow, but not disorganized. Denies auditory or visual hallucinations. Denies suicidal or homicidal ideation. He is alert and oriented x 4. Judgment and insight appear to be reasonably intact.   LABORATORY RESULTS: His blood sugars have been monitored since he has been here and it  remains somewhere in the area from 100 to the low 100s. Urinalysis most recently is normal. Drug screen was only positive for benzodiazepines. His EKG was actually normal with no atrial fibrillation and a normal QRS and QT.   DISCHARGE MEDICATIONS: Aspirin 325 mg per day, Depakote 1500 mg at night, docusate 100 mg twice a day, fluoxetine 20 mg daily, Haldol 2 mg twice a day, sliding scale only insulin, lorazepam 0.5 mg 3 times a day as needed for agitation, metoprolol XL 25 mg twice a day, pantoprazole 40 mg in the morning, polyethylene 17 grams daily, quetiapine XR 300 mg at night, Crestor 20 mg at night, tramadol 100 mg q. 6 hours p.r.n. for pain.  DIAGNOSIS:   AXIS I:  Schizophrenia.   SECONDARY DIAGNOSES: AXIS I: No further. AXIS III: Chronic constipation, high blood pressure, mild diabetes.    ____________________________ Gonzella Lex, MD jtc:LT D: 08/06/2014 15:57:15 ET T: 08/06/2014 16:22:02 ET JOB#: [id number]  cc: Gonzella Lex, MD, <Dictator> Gonzella Lex MD ELECTRONICALLY SIGNED 08/07/2014 22:34

## 2014-09-02 NOTE — Consult Note (Signed)
PATIENT NAME:  Lawrence Johnston, Lawrence Johnston MR#:  [id number] DATE OF BIRTH:  Jan 20, 1961  DATE OF CONSULTATION:  08/03/2014  REFERRING PHYSICIAN:   CONSULTING PHYSICIAN:  Gonzella Lex, MD  IDENTIFYING INFORMATION AND REASON FOR CONSULTATION: This is a 54 year old man with a history of schizophrenia who was referred to the hospital because of psychotic symptoms and hallucinations and thoughts about wanting to hurt people.   CHIEF COMPLAINT: "Hearing and seeing things."   HISTORY OF PRESENT ILLNESS: Information from the patient and the chart. The patient tells me that recently he has been both hearing things and seeing things which he thinks are demons. They are coming and walking by him day and night. This has been going on for days, possibly for weeks. He is not sleeping well at night. He says he is up and down all through the night. His mood has been bad and anxious. He cannot think of any new stress or reason why this might be happening. He says that he is having thoughts about hurting "girls". This is a complaint that he had made when he was here previously. He is not specifying anybody in particular that he is feeling angry at. He says he has been compliant with his medicines. He is unclear about whether there have been any changes to them. He is not abusing any drugs. He does not report any significant new medical problems other than not sleeping well.   PAST PSYCHIATRIC HISTORY: Previously he was admitted to the medical service here in 2013 with what appeared to be a delirium as well a worsening of his psychosis. Dr. Nicolasa Ducking worked with the medical team and the patient recovered eventually. Probably dehydration and overmedication were contributing to his symptoms. At the time of recovery, he seems to have resolved all of his symptoms and was no longer threatening. The patient has a history of long-standing schizophrenia and has had hospitalizations at [facility] in the past. Unclear if he has ever actually  been violent in the past.   Multiple medications, but the current combination of some mixture of Depakote, Seroquel and Haldol seems to have been chronic.   SOCIAL HISTORY: The patient lives in an assisted living facility. He says it is the same one that he has been in for 8 or 9 years. It is the same care home he was in when he was here last time. He is divorced, has adult children. Does not seem to have much contact with family, except possibly with his mother.   PAST MEDICAL HISTORY: Diabetes, high blood pressure, dyslipidemia, history of atrial fibrillation.   SUBSTANCE ABUSE HISTORY: The patient says he has never drank or used drugs ever in his life.   CURRENT MEDICATIONS: We have a list on paper drawn up. Unclear if this is the recent one. It looks like he had most recently been taking Depakote 2000 mg at night, possibly taking more Haldol than what he was previously. When he was here a few years ago, he was on Seroquel 300 mg at night, Haldol 2 mg twice a day and Depakote 1500 mg at night.   ALLERGIES: No known drug allergies.   REVIEW OF SYSTEMS: The patient says that he is tired but cannot sleep at night. No change to his appetite. Having auditory and visual hallucinations. Mood is feeling agitated. Having intrusive thoughts about hurting people. No other physical complaints.   MENTAL STATUS EXAMINATION: Middle-aged gentleman who looks his stated age or older. Passively cooperative with the interview. Good  eye contact. Psychomotor activity sluggish. Speech is decreased in total amount. Answers questions very concretely. Quiet as well. Affect flat. Mood stated as not so good. Thoughts are a little bit scattered but he is able to hold a conversation without getting bizarre. He says that he is seeing things and hearing things, which he thinks are demons. He is having intrusive thoughts about hurting people, although he does not want to act on them. Denies suicidal ideation. He can repeat 3  words immediately, remembers only 1 of them at three minutes. He is alert and oriented x4. Judgment and insight probably borderline.   LABORATORY RESULTS: His alcohol level is negative. The chemistry panel low. ALT at 15. Low calcium 8.8. Elevated creatinine 1.37. Blood sugar 128. CBC: Hematocrit low at 37.3, hemoglobin low at 12.5.   VITAL SIGNS: Blood pressure 131/65, respirations 18, pulse 67, temperature 98.1.   ASSESSMENT: This is a 54 year old man with schizophrenia. Apparently, he has decompensated with a return of his intense hallucinations and thoughts about hurting people. Last time he was here this was thought in part to be a delirium. It maybe just a worsening of his psychosis. Needs hospital level treatment.   TREATMENT PLAN: We do not have a bed downstairs available to admit him right now unfortunately. I have ordered an EKG and a Depakote level. The EKG is because when he was here last time he had an extended QRS interval related to his high medication doses.  The patient will be continued on the more modest doses of his antipsychotics as stated above as well as his sliding scale insulin and other medicines. I have suggested to the psychiatry staff that we consider referring him out to a geriatric psychiatry unit as we cannot admit him here right now.   DIAGNOSIS, PRINCIPAL AND PRIMARY: AXIS I: Schizophrenia.  SECONDARY DIAGNOSES: AXIS I: No further.  AXIS II: Deferred.  AXIS III: Diabetes, hypertension, dyslipidemia, history of atrial fibrillation, rule out delirium.  ____________________________ Gonzella Lex, MD jtc:sb D: 08/03/2014 17:00:41 ET T: 08/03/2014 17:22:53 ET JOB#: [id number]  cc: Gonzella Lex, MD, <Dictator> Gonzella Lex MD ELECTRONICALLY SIGNED 08/07/2014 22:34

## 2015-01-09 ENCOUNTER — Emergency Department: Payer: Medicare Other

## 2015-01-09 ENCOUNTER — Emergency Department
Admission: EM | Admit: 2015-01-09 | Discharge: 2015-01-10 | Disposition: A | Discharge status: Home / Self Care | Payer: Medicare Other | Attending: Emergency Medicine | Admitting: Emergency Medicine

## 2015-01-09 ENCOUNTER — Encounter: Payer: Self-pay | Admitting: Emergency Medicine

## 2015-01-09 DIAGNOSIS — F209 Schizophrenia, unspecified: Secondary | ICD-10-CM | POA: Diagnosis present

## 2015-01-09 DIAGNOSIS — Z794 Long term (current) use of insulin: Secondary | ICD-10-CM | POA: Diagnosis not present

## 2015-01-09 DIAGNOSIS — G212 Secondary parkinsonism due to other external agents: Secondary | ICD-10-CM | POA: Diagnosis present

## 2015-01-09 DIAGNOSIS — E119 Type 2 diabetes mellitus without complications: Secondary | ICD-10-CM | POA: Insufficient documentation

## 2015-01-09 DIAGNOSIS — F1310 Sedative, hypnotic or anxiolytic abuse, uncomplicated: Secondary | ICD-10-CM | POA: Diagnosis not present

## 2015-01-09 DIAGNOSIS — Z79899 Other long term (current) drug therapy: Secondary | ICD-10-CM | POA: Insufficient documentation

## 2015-01-09 DIAGNOSIS — R4182 Altered mental status, unspecified: Secondary | ICD-10-CM

## 2015-01-09 DIAGNOSIS — I10 Essential (primary) hypertension: Secondary | ICD-10-CM | POA: Diagnosis not present

## 2015-01-09 DIAGNOSIS — G2401 Drug induced subacute dyskinesia: Secondary | ICD-10-CM | POA: Diagnosis present

## 2015-01-09 DIAGNOSIS — F1910 Other psychoactive substance abuse, uncomplicated: Secondary | ICD-10-CM | POA: Insufficient documentation

## 2015-01-09 DIAGNOSIS — F203 Undifferentiated schizophrenia: Secondary | ICD-10-CM | POA: Diagnosis not present

## 2015-01-09 DIAGNOSIS — R109 Unspecified abdominal pain: Secondary | ICD-10-CM | POA: Diagnosis present

## 2015-01-09 DIAGNOSIS — T43505A Adverse effect of unspecified antipsychotics and neuroleptics, initial encounter: Secondary | ICD-10-CM

## 2015-01-09 LAB — TROPONIN I: Troponin I: <0.03 ng/mL (ref <0.031)

## 2015-01-09 LAB — COMPREHENSIVE METABOLIC PANEL
ALBUMIN: 3.5 g/dL (ref 3.5–5.0)
ALK PHOS: 56 U/L (ref 38–126)
ALT: 27 U/L (ref 17–63)
AST: 43 U/L — ABNORMAL HIGH (ref 15–41)
Anion gap: 8 (ref 5–15)
BILIRUBIN TOTAL: 0.8 mg/dL (ref 0.3–1.2)
BUN: 16 mg/dL (ref 6–20)
CALCIUM: 9 mg/dL (ref 8.9–10.3)
CO2: 27 mmol/L (ref 22–32)
CREATININE: 1.66 mg/dL — AB (ref 0.61–1.24)
Chloride: 107 mmol/L (ref 101–111)
GFR calc Af Amer: 52 mL/min — ABNORMAL LOW (ref >60)
GFR calc non Af Amer: 45 mL/min — ABNORMAL LOW (ref >60)
GLUCOSE: 62 mg/dL — AB (ref 65–99)
Potassium: 4.1 mmol/L (ref 3.5–5.1)
SODIUM: 142 mmol/L (ref 135–145)
Total Protein: 6.4 g/dL — ABNORMAL LOW (ref 6.5–8.1)

## 2015-01-09 LAB — URINALYSIS COMPLETE WITH MICROSCOPIC (ARMC ONLY)
Bacteria, UA: NONE SEEN
Bilirubin Urine: NEGATIVE
Glucose, UA: NEGATIVE mg/dL
Hgb urine dipstick: NEGATIVE
Leukocytes, UA: NEGATIVE
NITRITE: NEGATIVE
PH: 6 (ref 5.0–8.0)
Protein, ur: NEGATIVE mg/dL
RBC / HPF: NONE SEEN RBC/hpf (ref 0–5)
Specific Gravity, Urine: 1.016 (ref 1.005–1.030)
WBC UA: NONE SEEN WBC/hpf (ref 0–5)

## 2015-01-09 LAB — URINE DRUG SCREEN, QUALITATIVE (ARMC ONLY)
Amphetamines, Ur Screen: NOT DETECTED
Barbiturates, Ur Screen: NOT DETECTED
Benzodiazepine, Ur Scrn: POSITIVE — AB
CANNABINOID 50 NG, UR ~~LOC~~: NOT DETECTED
Cocaine Metabolite,Ur ~~LOC~~: NOT DETECTED
MDMA (ECSTASY) UR SCREEN: NOT DETECTED
Methadone Scn, Ur: NOT DETECTED
Opiate, Ur Screen: NOT DETECTED
Phencyclidine (PCP) Ur S: NOT DETECTED
Tricyclic, Ur Screen: POSITIVE — AB

## 2015-01-09 LAB — ETHANOL: Alcohol, Ethyl (B): <5 mg/dL (ref <5)

## 2015-01-09 LAB — CBC
HEMATOCRIT: 37.3 % — AB (ref 40.0–52.0)
HEMOGLOBIN: 12.6 g/dL — AB (ref 13.0–18.0)
MCH: 30.9 pg (ref 26.0–34.0)
MCHC: 33.7 g/dL (ref 32.0–36.0)
MCV: 91.9 fL (ref 80.0–100.0)
Platelets: 301 10*3/uL (ref 150–440)
RBC: 4.06 MIL/uL — AB (ref 4.40–5.90)
RDW: 14.4 % (ref 11.5–14.5)
WBC: 8.6 10*3/uL (ref 3.8–10.6)

## 2015-01-09 NOTE — ED Notes (Signed)

## 2015-01-09 NOTE — [facility] Counselor (Signed)
Tele-assessment cart 1 placed in Pt room for TTS assessment.

## 2015-01-09 NOTE — [facility] Counselor (Signed)
Writer reviewed Pt [place] assessment and spoke with TTS Counselor Helene Kelp S.). Writer consulted EDP Dr.Paduchowski regarding Pt disposition. Pt to be referred to Psych. MD for consult.

## 2015-01-09 NOTE — BH Assessment (Signed)
Tele Assessment Note   Lawrence Johnston is a 54 y.o. male who voluntarily presents to [facility] from [facility].  Pt denies SI/HI/AVH. This Probation officer attempted to interview pt, however he is a poor historian and was unable to answer most questions.  When asked the date, pt responded, "makes me feel loved", pt began to discuss wanting to go home to visit with his grandchildren.  Pt was asked the current president's name and he responded--"it was the vice president".  Due to pt.'s mental status, this Probation officer contacted [facility] for collateral information and spoke with Jobie Quaker.  Ms Berline Lopes informed this Probation officer that pt has been acting abnormally for approx 1 week, but has not expressed wanting to harm himself or anyone else.  Pt was brought to the emerg dept for possible UTI and nursing facility's concern that if he has a UTI that this may be contributing to his mental status.  Per [facility] nurse, pt has been talking about people in dumpsters looking for him and talking to people that are not present.  This Probation officer did not observe pt responding to internal stimuli or talking to people not present.  The facility was also concerned about a fall the may have had, however Ms. Berline Lopes denied any recent falls that she is aware of at this time.  Pt is compliant with psych meds and has no complaints that meds may not be working well for him.  He is receiving services with Dr. Wonda Amis.  Axis I: Schizophrenia Axis II: Deferred Axis III:  Past Medical History  Diagnosis Date  . Hypercholesteremia   . Tremor   . Diabetes   . Arrhythmia   . Schizophrenia   . Hypertension    Axis IV: other psychosocial or environmental problems, problems related to social environment and problems with primary support group Axis V: 51-60 moderate symptoms  Past Medical History:  Past Medical History  Diagnosis Date  . Hypercholesteremia   . Tremor   . Diabetes   . Arrhythmia   . Schizophrenia    . Hypertension     Past Surgical History  Procedure Laterality Date  . Cardioversion      x2, a-fib    Family History: History reviewed. No pertinent family history.  Social History:  reports that he has never smoked. He does not have any smokeless tobacco history on file. He reports that he does not drink alcohol or use illicit drugs.  Additional Social History:  Alcohol / Drug Use Pain Medications: See MAR  Prescriptions: See MAR  Over the Counter: See MAR  History of alcohol / drug use?: No history of alcohol / drug abuse Longest period of sobriety (when/how long): None   CIWA: CIWA-Ar BP: 124/80 mmHg Pulse Rate: 73 COWS:    PATIENT STRENGTHS: (choose at least two) Supportive family/friends  Allergies: No Known Allergies  Home Medications:  (Not in a hospital admission)  OB/GYN Status:  No LMP for male patient.  General Assessment Data Location of Assessment: [facility] 1A TTS Assessment: In system Is this a Tele or Face-to-Face Assessment?: Tele Assessment Is this an Initial Assessment or a Re-assessment for this encounter?: Initial Assessment Marital status: Single Maiden name: None  Is patient pregnant?: No Pregnancy Status: No Living Arrangements: Other (Comment) (Lives in [facility] ) Can pt return to current living arrangement?: Yes Admission Status: Voluntary Is patient capable of signing voluntary admission?: Yes Referral Source: MD Insurance type: MCR/MCD  Medical Screening Exam [facility]  Walk-in ONLY) Medical Exam completed: No Reason for MSE not completed: Other: (None )  Crisis Care Plan Living Arrangements: Other (Comment) (Lives in [place] ) Name of Psychiatrist: [place]  Name of Therapist: None   Education Status Is patient currently in school?: No Current Grade: None  Highest grade of school patient has completed: None  Name of school: None  Contact person: None   Risk to self with the past 6 months Suicidal  Ideation: No Has patient been a risk to self within the past 6 months prior to admission? : No Suicidal Intent: No Has patient had any suicidal intent within the past 6 months prior to admission? : No Is patient at risk for suicide?: No Suicidal Plan?: No Has patient had any suicidal plan within the past 6 months prior to admission? : No Access to Means: No What has been your use of drugs/alcohol within the last 12 months?: None  Previous Attempts/Gestures: No How many times?: 0 Other Self Harm Risks: None  Triggers for Past Attempts: None known Intentional Self Injurious Behavior: None Family Suicide History: No Recent stressful life event(s): Other (Comment) (Recent altered mental status ) Persecutory voices/beliefs?: No Depression: No Depression Symptoms:  (None reported ) Substance abuse history and/or treatment for substance abuse?: No Suicide prevention information given to non-admitted patients: Not applicable  Risk to Others within the past 6 months Homicidal Ideation: No Does patient have any lifetime risk of violence toward others beyond the six months prior to admission? : No Thoughts of Harm to Others: No Current Homicidal Intent: No Current Homicidal Plan: No Access to Homicidal Means: No Identified Victim: None  History of harm to others?: No Assessment of Violence: None Noted Violent Behavior Description: None  Does patient have access to weapons?: No Criminal Charges Pending?: No Does patient have a court date: No Is patient on probation?: No  Psychosis Hallucinations: None noted Delusions: None noted  Mental Status Report Appearance/Hygiene: In hospital gown Eye Contact: Good Motor Activity: Unremarkable Speech: Tangential Level of Consciousness: Alert Mood: Other (Comment) (Appropriate ) Affect: Appropriate to circumstance Anxiety Level: None Thought Processes: Tangential Judgement: Partial Orientation: Person, Place, Time, Situation Obsessive  Compulsive Thoughts/Behaviors: None  Cognitive Functioning Concentration: Decreased Memory: Recent Impaired, Remote Impaired IQ: Average Insight: Fair Impulse Control: Good Appetite: Good Weight Loss: 0 Weight Gain: 0 Sleep: No Change Total Hours of Sleep: 6 Vegetative Symptoms: None  ADLScreening [facility] Assessment Services) Patient's cognitive ability adequate to safely complete daily activities?: Yes Patient able to express need for assistance with ADLs?: Yes Independently performs ADLs?: No  Prior Inpatient Therapy Prior Inpatient Therapy: No Prior Therapy Dates: None  Prior Therapy Facilty/Provider(s): None  Reason for Treatment: None   Prior Outpatient Therapy Prior Outpatient Therapy: Yes Prior Therapy Dates: Current  Prior Therapy Facilty/Provider(s): Pt unable to provide physician's name  Reason for Treatment: Med Mgt  Does patient have an ACCT team?: No Does patient have Intensive In-House Services?  : No Does patient have [facility] services? : No Does patient have P4CC services?: No  ADL Screening (condition at time of admission) Patient's cognitive ability adequate to safely complete daily activities?: Yes Is the patient deaf or have difficulty hearing?: No Does the patient have difficulty seeing, even when wearing glasses/contacts?: No Does the patient have difficulty concentrating, remembering, or making decisions?: Yes Patient able to express need for assistance with ADLs?: Yes Does the patient have difficulty dressing or bathing?: Yes (May need assistance due to tremors(Parkinsons) ) Independently  performs ADLs?: No Communication: Independent Dressing (OT): Needs assistance (May require assistance due to tremors ) Is this a change from baseline?: Pre-admission baseline Grooming: Needs assistance (May require assistance due to tremors ) Is this a change from baseline?: Pre-admission baseline Feeding: Needs assistance (May require assistance due to tremors  ) Is this a change from baseline?: Pre-admission baseline Bathing: Needs assistance (May require assistance due to tremors ) Is this a change from baseline?: Pre-admission baseline Toileting: Needs assistance (May require assistance due to tremors ) Is this a change from baseline?: Pre-admission baseline In/Out Bed: Needs assistance (May require assistance due to tremors ) Is this a change from baseline?: Pre-admission baseline Walks in Home: Needs assistance (May require assistance due to tremors ) Is this a change from baseline?: Pre-admission baseline Does the patient have difficulty walking or climbing stairs?: Yes Weakness of Legs: Both (May require assistance ) Weakness of Arms/Hands: Both (Tremors due to hx )  Home Assistive Devices/Equipment Home Assistive Devices/Equipment: Other (Comment) (None reported )  Therapy Consults (therapy consults require a physician order) PT Evaluation Needed: No OT Evalulation Needed: No SLP Evaluation Needed: No Abuse/Neglect Assessment (Assessment to be complete while patient is alone) Physical Abuse: Denies Verbal Abuse: Denies Sexual Abuse: Denies Exploitation of patient/patient's resources: Denies Self-Neglect: Denies Values / Beliefs Cultural Requests During Hospitalization: None Spiritual Requests During Hospitalization: None Consults Spiritual Care Consult Needed: No Social Work Consult Needed: No Regulatory affairs officer (For Healthcare) Does patient have an advance directive?: No Would patient like information on creating an advanced directive?: No - patient declined information    Additional Information 1:1 In Past 12 Months?: No CIRT Risk: No Elopement Risk: No Does patient have medical clearance?: Yes     Disposition:  Disposition Initial Assessment Completed for this Encounter: Yes Disposition of Patient: Referred to (Pt to be reviewed by [facility] psych ) Patient referred to: Other (Comment) (Pt to be reviewed by [facility] psych  )  Girtha Rm 01/09/2015 9:54 PM

## 2015-01-09 NOTE — ED Notes (Signed)
Patient calling out asking whether or not he would be going back home tonight. At this time, disposition has not been determined. Patient made aware that he would be updated when MD makes a disposition; verbalized understanding. No verbalized needs at this time. Will continue to monitor.

## 2015-01-09 NOTE — ED Notes (Signed)
Patient under IVC. Patient observed resting in room with NAD noted. Respirations even and non-labored. Patient on CCM; NSR without ectopy noted. Patient without verbalized needs at this time. Will continue to monitor.

## 2015-01-09 NOTE — ED Notes (Signed)
Chg nurse notified of pt's response to SI questions.

## 2015-01-09 NOTE — ED Notes (Addendum)
Pt from [facility]. EMS was called for possible UTI. Pt also fell this morning. When EMS arrived, CBG was 58. They gave him 25 g of D50, and CBG increased to 120. Pt states his complaint is chest pain. When asked to point to the site of the pain, he touches his LLQ. Pt states he has had this pain for 2 weeks, and it keeps him awake at night. EMS reports that group home also states pt has altered mental status.

## 2015-01-09 NOTE — ED Notes (Signed)
15 minute checks ok per md

## 2015-01-09 NOTE — ED Provider Notes (Addendum)
[facility] Emergency Department Provider Note  Time seen: 3:16 PM  I have reviewed the triage vital signs and the nursing notes.   HISTORY  Chief Complaint Altered mental status   HPI Lawrence Johnston is a 54 y.o. male the past medical history of hyperlipidemia, atrial fibrillation, diabetes, schizophrenia, hypertension who presents the emergency department with altered mental status. According to the nurse at the patient's nursing facility [facility] care center) him a patient was not acting himself today. Was talking about people being in the dumpsters looking for him, and was talking to people who were not in the room. Per the nurse the patient was not acting his normal self so she sent him to the emergency department thinking that he might have a urinary tract infection. Triage note states the patient fell today, nurse at the facility denies any known falls to her. Patient has a history of schizophrenia and has been evaluated in the emergency department multiple times for hallucinations in the past. Patient denies any symptoms currently. Contrary to triage note patient denies any chest pain or abdominal pain. When asked if he hurts anywhere the patient clearly states no. I have also asked the patient about suicidal or homicidal ideation and the patient states he has none.     Past Medical History  Diagnosis Date  . Hypercholesteremia   . Tremor   . Diabetes   . Arrhythmia   . Schizophrenia   . Hypertension     Patient Active Problem List   Diagnosis Date Noted  . 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 Schizophrenia 06/19/2013    Past Surgical History  Procedure Laterality Date  . Cardioversion      x2, a-fib    Current Outpatient Rx  Name  Route  Sig  Dispense  Refill  . carbidopa-levodopa (SINEMET IR) 25-100 MG per tablet   Oral   Take 1 tablet by mouth 3 (three)  times daily.         . divalproex (DEPAKOTE) 500 MG DR tablet   Oral   Take 500 mg by mouth at bedtime. 4 tablets at bedtime         . docusate sodium (COLACE) 100 MG capsule   Oral   Take 100 mg by mouth daily.         Marland Kitchen FLUoxetine (PROZAC) 20 MG tablet   Oral   Take 20 mg by mouth daily.         Marland Kitchen gabapentin (NEURONTIN) 300 MG capsule   Oral   Take 300 mg by mouth 2 (two) times daily.         Marland Kitchen glyBURIDE-metformin (GLUCOVANCE) 2.5-500 MG per tablet   Oral   Take 1 tablet by mouth 2 (two) times daily with a meal.         . haloperidol (HALDOL) 2 MG tablet   Oral   Take 2 mg by mouth 2 (two) times daily.         Marland Kitchen HYDROcodone-acetaminophen (NORCO) 10-325 MG per tablet   Oral   Take 1 tablet by mouth every 6 (six) hours as needed.         . insulin aspart (NOVOLOG) 100 UNIT/ML injection   Subcutaneous   Inject into the skin once. Sliding scale         . LORazepam (ATIVAN) 0.5 MG tablet   Oral   Take 0.5 mg by mouth 2 (two) times daily.         Marland Kitchen  metoprolol succinate (TOPROL-XL) 25 MG 24 hr tablet   Oral   Take 25 mg by mouth daily.         . pantoprazole (PROTONIX) 40 MG tablet   Oral   Take 40 mg by mouth daily.         . polyethylene glycol (MIRALAX / GLYCOLAX) packet   Oral   Take 17 g by mouth daily.         . propranolol (INDERAL) 40 MG tablet   Oral   Take 40 mg by mouth 2 (two) times daily.         . QUEtiapine (SEROQUEL) 400 MG tablet   Oral   Take 400 mg by mouth at bedtime.         Marland Kitchen rOPINIRole (REQUIP) 2 MG tablet   Oral   Take 2 mg by mouth at bedtime.         . rosuvastatin (CRESTOR) 20 MG tablet   Oral   Take 20 mg by mouth daily.         Marland Kitchen thiamine (VITAMIN B-1) 100 MG tablet   Oral   Take 100 mg by mouth daily.           Allergies Review of patient's allergies indicates no known allergies.  History reviewed. No pertinent family history.  Social History Social History  Substance Use Topics   . Smoking status: Never Smoker   . Smokeless tobacco: None  . Alcohol Use: No    Review of Systems Constitutional: Negative for fever Cardiovascular: Negative for chest pain. Respiratory: Negative for shortness of breath. Gastrointestinal: Negative for abdominal pain Genitourinary: Negative for dysuria. Musculoskeletal: Negative for back pain. Neurological: Negative for headaches, focal weakness or numbness. Psychiatric:Denies SI or HI. 10-point ROS otherwise negative.  ____________________________________________   PHYSICAL EXAM:  VITAL SIGNS: ED Triage Vitals  Enc Vitals Group     BP 01/09/15 1413 117/78 mmHg     Pulse Rate 01/09/15 1413 73     Resp 01/09/15 1413 16     Temp 01/09/15 1413 98.6 F (37 C)     Temp Source 01/09/15 1413 Oral     SpO2 01/09/15 1413 100 %     Weight 01/09/15 1413 240 lb (108.863 kg)     Height 01/09/15 1413 5\' 9"  (1.753 m)     Head Cir --      Peak Flow --      Pain Score 01/09/15 1413 6     Pain Loc --      Pain Edu? --      Excl. in [place]? --     Constitutional: Alert.  Well appearing and in no distress. Slow to respond to questioning. Eyes: Normal exam, PERRL ENT   Head: Normocephalic and atraumatic. Cardiovascular: Normal rate, regular rhythm. No murmur Respiratory: Normal respiratory effort without tachypnea nor retractions. Breath sounds are clear  Gastrointestinal: Soft and nontender. No distention.   Musculoskeletal: Nontender with normal range of motion in all extremities. Neurologic:  Slow speech, moves all extremities without issue. Skin:  Skin is warm, dry and intact.  Psychiatric: Mood and affect are normal. Speech and behavior are normal.   ____________________________________________   RADIOLOGY  CT shows atrophy but no acute condition.   EKG reviewed and interpreted by myself shows sinus rhythm at 74 bpm, narrow QRS, normal axis, normal intervals, nonspecific ST changes present. No ST elevation  noted. ____________________________________________   INITIAL IMPRESSION / ASSESSMENT AND PLAN / ED COURSE  Pertinent  labs & imaging results that were available during my care of the patient were reviewed by me and considered in my medical decision making (see chart for details).  Patient presents from the nursing home with altered mental status and hallucinations. Patient states he believes he fell today, but nurse at his facility denies any known falls. They sent him because he was hallucinating. Patient appears well currently, no complaints currently. Given the questionable history of falls we'll obtain a CT head, lab workup evaluate, and have psychiatry evaluate the patient.  Labs and CT are largely within normal limits/at baseline. No acute findings. Patient to be evaluated by psychiatry. I do not believe the patient currently meets involuntary commitment criteria but I believe he would benefit from psychiatric evaluation. Patient is agreeable.  ____________________________________________   FINAL CLINICAL IMPRESSION(S) / ED DIAGNOSES  Altered mental status Hallucinations   Harvest Dark, MD 01/09/15 1916  Harvest Dark, MD 01/09/15 [id number]

## 2015-01-09 NOTE — ED Notes (Signed)
Patient requesting to eat at this time. RN spoke with MD who advised that patient is ok to have a regular DAT. Sandwich tray provided. Diet order entered into [facility] by this RN.

## 2015-01-10 DIAGNOSIS — F203 Undifferentiated schizophrenia: Secondary | ICD-10-CM | POA: Diagnosis not present

## 2015-01-10 LAB — VALPROIC ACID LEVEL: VALPROIC ACID LVL: 82 ug/mL (ref 50.0–100.0)

## 2015-01-10 NOTE — ED Notes (Signed)
BEHAVIORAL HEALTH ROUNDING Patient sleeping: No. Patient alert and oriented: yes Behavior appropriate: Yes.  ; If no, describe: pt still walking around room and occasionally coming to door; redirected to stretcher/bench Nutrition and fluids offered: Yes  Toileting and hygiene offered: Yes  Sitter present: no Law enforcement present: Yes

## 2015-01-10 NOTE — ED Notes (Signed)
ENVIRONMENTAL ASSESSMENT Potentially harmful objects out of patient reach: Yes.   Personal belongings secured: Yes.   Patient dressed in hospital provided attire only: No. Plastic bags out of patient reach: Yes.   Patient care equipment (cords, cables, call bells, lines, and drains) shortened, removed, or accounted for: Yes.   Equipment and supplies removed from bottom of stretcher: Yes.   Potentially toxic materials out of patient reach: Yes.   Sharps container removed or out of patient reach: Yes.   

## 2015-01-10 NOTE — Progress Notes (Signed)
LCSW called [facility]  and spoke to Care provider. They will be able to pick up patient before 5pm or we can send by cab back to his group home after 5pm. Patient has a follow up appointment with Dr Evelena Peat on Monday Sept 12th. They are happy to hear he is doing oK.

## 2015-01-10 NOTE — ED Notes (Signed)

## 2015-01-10 NOTE — ED Notes (Signed)
BEHAVIORAL HEALTH ROUNDING Patient sleeping: No. Patient alert and oriented: yes Behavior appropriate: Yes.  ; If no, describe:  Nutrition and fluids offered: Yes  Toileting and hygiene offered: Yes  Sitter present: no Law enforcement present: Yes  

## 2015-01-10 NOTE — ED Notes (Signed)
Pt discharged to group home after verbalizing understanding of discharge instructions; nad noted.

## 2015-01-10 NOTE — ED Provider Notes (Signed)
-----------------------------------------   4:29 PM on 01/10/2015 -----------------------------------------  Lawrence Johnston has been seen by Dr. Weber Cooks, psychiatry. The patient is psychiatrically stable and able to be discharged back to the group home. This is been discussed with the group home and they will come pick him up    FINAL CLINICAL IMPRESSION(S) / ED DIAGNOSES  Altered mental status Hallucinations Resolved.  Ahmed Prima, MD 01/10/15 [phone]

## 2015-01-10 NOTE — Consult Note (Signed)
[facility] Face-to-Face Psychiatry Consult   Reason for Consult:  Consult for this 54 year old man with a history of schizophrenia brought here voluntarily from his group home with concerns about psychotic symptoms. Referring Physician:  Cinda Quest Patient Identification: Lawrence Johnston MRN:  [id number] Principal Diagnosis: Schizophrenia Diagnosis:   Patient Active Problem List   Diagnosis Date Noted  . 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 Schizophrenia [F20.9] 06/19/2013    Total Time spent with patient: 1 hour  Subjective:   Lawrence Johnston is a 54 y.o. male patient admitted with 54 year old man with long history of schizophrenia. He was brought here from his group home with reports that they think he's been having more psychotic symptoms. They report such examples as him looking through dumpsters for people. There is no report of him being suicidal or homicidal. On interview with one of the intake counselors last night the patient was documented as having psychotic symptoms although later they did not appear to be so prominent. On my interview today the patient is not reporting any psychotic symptoms. He denies that he's been having any hallucinations recently. He says that his mood feels okay. He says that he has been compliant with all his medicines and that he sleeps well. He has no complaints about his group home. He has no physical complaints. Has not been abusing any drugs or alcohol.  Marland Kitchen  HPI:  As noted above. Brought here with concerns of worsening psychosis but to my interview today he is not showing acute psychotic symptoms and there is no evidence here of him being acutely dangerous. No evident new trauma or stress that he can identify. Unclear how long this is been going on but it seems to be intermittent.  Past psychiatric history: Long history of schizophrenia with multiple hospitalizations  in the past. He was here at our hospital in the spring of this year. Usually responds well to medications including mood stabilizers and anti-psychotics. Street of suicide attempts or violence.  Substance abuse history: Patient denies alcohol or drug abuse currently or in the past.  Social history: He lives in a group home. Still has some contact with his family of origin. Has no complaints about his current social environment.  Family history: Denies any family history of mental illness  Medical history: Patient has diabetes and history of high blood pressure. Chronic constipation. All chronic and managed. HPI Elements:   Quality:  Confusion and disorganized thinking. Severity:  Moderate. Timing:  Seems to been worse for the past week or 2. Duration:  Resolving now. Context:  Unclear context. Chronic illness with occasional relapses.  Past Medical History:  Past Medical History  Diagnosis Date  . Hypercholesteremia   . Tremor   . Diabetes   . Arrhythmia   . Schizophrenia   . Hypertension     Past Surgical History  Procedure Laterality Date  . Cardioversion      x2, a-fib   Family History: History reviewed. No pertinent family history. Social History:  History  Alcohol Use No     History  Drug Use No    Social History   Social History  . Marital Status: Single    Spouse Name: N/A  . Number of Children: N/A  . Years of Education: N/A   Social History Main Topics  . Smoking status: Never Smoker   . Smokeless tobacco: None  . Alcohol Use: No  . Drug Use: No  .  Sexual Activity: Not Asked   Other Topics Concern  . None   Social History Narrative   Additional Social History:    Pain Medications: See MAR  Prescriptions: See MAR  Over the Counter: See MAR  History of alcohol / drug use?: No history of alcohol / drug abuse Longest period of sobriety (when/how long): None                      Allergies:  No Known Allergies  Labs:  Results for orders  placed or performed during the hospital encounter of 01/09/15 (from the past 48 hour(s))  Valproic acid level     Status: None   Collection Time: 01/09/15  8:01 AM  Result Value Ref Range   Valproic Acid Lvl 82 50.0 - 100.0 ug/mL  Urinalysis complete, with microscopic ([facility] only)     Status: Abnormal   Collection Time: 01/09/15  6:16 PM  Result Value Ref Range   Color, Urine YELLOW (A) YELLOW   APPearance CLEAR (A) CLEAR   Glucose, UA NEGATIVE NEGATIVE mg/dL   Bilirubin Urine NEGATIVE NEGATIVE   Ketones, ur TRACE (A) NEGATIVE mg/dL   Specific Gravity, Urine 1.016 1.005 - 1.030   Hgb urine dipstick NEGATIVE NEGATIVE   pH 6.0 5.0 - 8.0   Protein, ur NEGATIVE NEGATIVE mg/dL   Nitrite NEGATIVE NEGATIVE   Leukocytes, UA NEGATIVE NEGATIVE   RBC / HPF NONE SEEN 0 - 5 RBC/hpf   WBC, UA NONE SEEN 0 - 5 WBC/hpf   Bacteria, UA NONE SEEN NONE SEEN   Squamous Epithelial / LPF 0-5 (A) NONE SEEN   Mucous PRESENT    Hyaline Casts, UA PRESENT   CBC     Status: Abnormal   Collection Time: 01/09/15  6:16 PM  Result Value Ref Range   WBC 8.6 3.8 - 10.6 K/uL   RBC 4.06 (L) 4.40 - 5.90 MIL/uL   Hemoglobin 12.6 (L) 13.0 - 18.0 g/dL   HCT 37.3 (L) 40.0 - 52.0 %   MCV 91.9 80.0 - 100.0 fL   MCH 30.9 26.0 - 34.0 pg   MCHC 33.7 32.0 - 36.0 g/dL   RDW 14.4 11.5 - 14.5 %   Platelets 301 150 - 440 K/uL  Comprehensive metabolic panel     Status: Abnormal   Collection Time: 01/09/15  6:16 PM  Result Value Ref Range   Sodium 142 135 - 145 mmol/L   Potassium 4.1 3.5 - 5.1 mmol/L   Chloride 107 101 - 111 mmol/L   CO2 27 22 - 32 mmol/L   Glucose, Bld 62 (L) 65 - 99 mg/dL   BUN 16 6 - 20 mg/dL   Creatinine, Ser 1.66 (H) 0.61 - 1.24 mg/dL   Calcium 9.0 8.9 - 10.3 mg/dL   Total Protein 6.4 (L) 6.5 - 8.1 g/dL   Albumin 3.5 3.5 - 5.0 g/dL   AST 43 (H) 15 - 41 U/L   ALT 27 17 - 63 U/L   Alkaline Phosphatase 56 38 - 126 U/L   Total Bilirubin 0.8 0.3 - 1.2 mg/dL   GFR calc non Af Amer 45 (L) >60 mL/min    GFR calc Af Amer 52 (L) >60 mL/min    Comment: (NOTE) The eGFR has been calculated using the CKD EPI equation. This calculation has not been validated in all clinical situations. eGFR's persistently <60 mL/min signify possible Chronic Kidney Disease.    Anion gap 8 5 - 15  Troponin I  Status: None   Collection Time: 01/09/15  6:16 PM  Result Value Ref Range   Troponin I <0.03 <0.031 ng/mL    Comment:        NO INDICATION OF MYOCARDIAL INJURY.   Ethanol     Status: None   Collection Time: 01/09/15  6:16 PM  Result Value Ref Range   Alcohol, Ethyl (B) <5 <5 mg/dL    Comment:        LOWEST DETECTABLE LIMIT FOR SERUM ALCOHOL IS 5 mg/dL FOR MEDICAL PURPOSES ONLY   Urine Drug Screen, Qualitative ([facility] only)     Status: Abnormal   Collection Time: 01/09/15  6:16 PM  Result Value Ref Range   Tricyclic, Ur Screen POSITIVE (A) NONE DETECTED   Amphetamines, Ur Screen NONE DETECTED NONE DETECTED   MDMA (Ecstasy)Ur Screen NONE DETECTED NONE DETECTED   Cocaine Metabolite,Ur [place] NONE DETECTED NONE DETECTED   Opiate, Ur Screen NONE DETECTED NONE DETECTED   Phencyclidine (PCP) Ur S NONE DETECTED NONE DETECTED   Cannabinoid 50 Ng, Ur [place] NONE DETECTED NONE DETECTED   Barbiturates, Ur Screen NONE DETECTED NONE DETECTED   Benzodiazepine, Ur Scrn POSITIVE (A) NONE DETECTED   Methadone Scn, Ur NONE DETECTED NONE DETECTED    Comment: (NOTE) 166  Tricyclics, urine               Cutoff 1000 ng/mL 200  Amphetamines, urine             Cutoff 1000 ng/mL 300  MDMA (Ecstasy), urine           Cutoff 500 ng/mL 400  Cocaine Metabolite, urine       Cutoff 300 ng/mL 500  Opiate, urine                   Cutoff 300 ng/mL 600  Phencyclidine (PCP), urine      Cutoff 25 ng/mL 700  Cannabinoid, urine              Cutoff 50 ng/mL 800  Barbiturates, urine             Cutoff 200 ng/mL 900  Benzodiazepine, urine           Cutoff 200 ng/mL 1000 Methadone, urine                Cutoff 300 ng/mL 1100 1200 The  urine drug screen provides only a preliminary, unconfirmed 1300 analytical test result and should not be used for non-medical 1400 purposes. Clinical consideration and professional judgment should 1500 be applied to any positive drug screen result due to possible 1600 interfering substances. A more specific alternate chemical method 1700 must be used in order to obtain a confirmed analytical result.  1800 Gas chromato graphy / mass spectrometry (GC/MS) is the preferred 1900 confirmatory method.     Vitals: Blood pressure 126/79, pulse 72, temperature 98.6 F (37 C), temperature source Oral, resp. rate 14, height 5' 9"  (1.753 m), weight 108.863 kg (240 lb), SpO2 98 %.  Risk to Self: Suicidal Ideation: No Suicidal Intent: No Is patient at risk for suicide?: No Suicidal Plan?: No Access to Means: No What has been your use of drugs/alcohol within the last 12 months?: None  How many times?: 0 Other Self Harm Risks: None  Triggers for Past Attempts: None known Intentional Self Injurious Behavior: None Risk to Others: Homicidal Ideation: No Thoughts of Harm to Others: No Current Homicidal Intent: No Current Homicidal Plan: No Access to Homicidal Means:  No Identified Victim: None  History of harm to others?: No Assessment of Violence: None Noted Violent Behavior Description: None  Does patient have access to weapons?: No Criminal Charges Pending?: No Does patient have a court date: No Prior Inpatient Therapy: Prior Inpatient Therapy: No Prior Therapy Dates: None  Prior Therapy Facilty/Provider(s): None  Reason for Treatment: None  Prior Outpatient Therapy: Prior Outpatient Therapy: Yes Prior Therapy Dates: Current  Prior Therapy Facilty/Provider(s): Pt unable to provide physician's name  Reason for Treatment: Med Mgt  Does patient have an ACCT team?: No Does patient have Intensive In-House Services?  : No Does patient have [facility] services? : No Does patient have [facility]  services?: No  No current facility-administered medications for this encounter.   Current Outpatient Prescriptions  Medication Sig Dispense Refill  . docusate sodium (COLACE) 100 MG capsule Take 100 mg by mouth daily.    Marland Kitchen FLUoxetine (PROZAC) 20 MG tablet Take 20 mg by mouth daily.    . fluPHENAZine (PROLIXIN) 5 MG tablet Take 5 mg by mouth 2 (two) times daily.    Marland Kitchen LORazepam (ATIVAN) 0.5 MG tablet Take 0.5 mg by mouth every morning.     . pantoprazole (PROTONIX) 40 MG tablet Take 40 mg by mouth daily.    . polyethylene glycol (MIRALAX / GLYCOLAX) packet Take 17 g by mouth daily.    . divalproex (DEPAKOTE) 500 MG DR tablet Take 500 mg by mouth at bedtime. 4 tablets at bedtime    . gabapentin (NEURONTIN) 300 MG capsule Take 300 mg by mouth 2 (two) times daily.    Marland Kitchen glyBURIDE-metformin (GLUCOVANCE) 2.5-500 MG per tablet Take 1 tablet by mouth 2 (two) times daily with a meal.    . insulin aspart (NOVOLOG) 100 UNIT/ML injection Inject into the skin once. Sliding scale    . metoprolol succinate (TOPROL-XL) 25 MG 24 hr tablet Take 25 mg by mouth daily.    . QUEtiapine (SEROQUEL) 400 MG tablet Take 400 mg by mouth at bedtime.    Marland Kitchen rOPINIRole (REQUIP) 2 MG tablet Take 2 mg by mouth at bedtime.    . rosuvastatin (CRESTOR) 20 MG tablet Take 20 mg by mouth daily.      Musculoskeletal: Strength & Muscle Tone: within normal limits Gait & Station: normal Patient leans: N/A  Psychiatric Specialty Exam: Physical Exam  Nursing note and vitals reviewed. Constitutional: He appears well-developed and well-nourished.  HENT:  Head: Normocephalic and atraumatic.  Eyes: Conjunctivae are normal. Pupils are equal, round, and reactive to light.  Neck: Normal range of motion.  Cardiovascular: Normal heart sounds.   Respiratory: Effort normal.  GI: Soft.  Musculoskeletal: Normal range of motion.  Neurological: He is alert.  Skin: Skin is warm and dry.  Psychiatric: His speech is normal and behavior is  normal. Judgment and thought content normal. His affect is blunt. Cognition and memory are normal.    Review of Systems  Constitutional: Negative.   HENT: Negative.   Eyes: Negative.   Respiratory: Negative.   Cardiovascular: Negative.   Gastrointestinal: Negative.   Musculoskeletal: Negative.   Skin: Negative.   Neurological: Negative.   Psychiatric/Behavioral: Negative for depression, suicidal ideas, hallucinations and substance abuse. The patient is not nervous/anxious and does not have insomnia.     Blood pressure 126/79, pulse 72, temperature 98.6 F (37 C), temperature source Oral, resp. rate 14, height 5' 9"  (1.753 m), weight 108.863 kg (240 lb), SpO2 98 %.Body mass index is 35.43 kg/(m^2).  General Appearance: Casual  Eye Contact::  Good  Speech:  Slow  Volume:  Normal  Mood:  Euthymic  Affect:  Flat  Thought Process:  Goal Directed  Orientation:  Full (Time, Place, and Person)  Thought Content:  Negative  Suicidal Thoughts:  No  Homicidal Thoughts:  No  Memory:  Immediate;   Good Recent;   Fair Remote;   Fair  Judgement:  Fair  Insight:  Fair  Psychomotor Activity:  Decreased  Concentration:  Fair  Recall:  [employer] [place]  Language: Fair  Akathisia:  No  Handed:  Right  AIMS (if indicated):     Assets:  Desire for Improvement Financial Resources/Insurance Housing Physical Health Social Support  ADL's:  Intact  Cognition: Impaired,  Mild  Sleep:      Medical Decision Making: Established Problem, Stable/Improving (1), Self-Limited or Minor (1), Review of Psycho-Social Stressors (1), Review or order clinical lab tests (1) and Review of Medication Regimen & Side Effects (2)  Treatment Plan Summary: Medication management and Plan To my examination the patient appears to be stable. He is certainly prone to having relapses of psychotic symptoms but does not appear to be dangerous to himself or others. He is stating a intention to be cooperative with  medication. He does not appear to need hospital level treatment. Patient will be discharged back to his own living situation. Counseling done with him about importance of compliance with medicine. Case reviewed with emergency room physician and psychiatric staff.  Plan:  No evidence of imminent risk to self or others at present.   Patient does not meet criteria for psychiatric inpatient admission. Discussed crisis plan, support from social network, calling 911, coming to the Emergency Department, and calling Suicide Hotline. Disposition: Patient will be discharged at the discretion of emergency room physician back home. Continue current medication plans at outpatient management per his nursing home  Alethia Berthold 01/10/2015 12:40 PM

## 2015-01-10 NOTE — ED Notes (Signed)
Pt dressed out at this time. 

## 2015-01-10 NOTE — ED Notes (Signed)
BEHAVIORAL HEALTH ROUNDING Patient sleeping: Yes.   Patient alert and oriented: not applicable Behavior appropriate: Yes.  ; If no, describe:  Nutrition and fluids offered: Yes  Toileting and hygiene offered: Yes  Sitter present: no Law enforcement present: Yes  

## 2015-01-10 NOTE — Progress Notes (Signed)
LCSW reviewed patient history-

## 2015-01-10 NOTE — ED Notes (Signed)
Patient awake at this time. VS updated. Patient confused. States that he is hearing people sing songs to everybody by him. Patient redirected. Requesting food at this time. Patient provided with a meal tray per MD direction. No further verbalized needs. Will continue to monitor.

## 2015-01-10 NOTE — ED Notes (Signed)
Patient continues to rest in room with NAD noted. Sleeping at this time with even and non-labored respiratory pattern noted. No anticipated needs identified. Will continue to monitor.

## 2015-01-10 NOTE — Discharge Instructions (Signed)
Blood tests, urine tests, and head CT were performed and were overall normal. Lawrence Johnston was seen by a psychiatrist, who has cleared him for return to the group home. Return to the emergency department if there are any further urgent concerns.

## 2015-01-10 NOTE — ED Notes (Signed)
BEHAVIORAL HEALTH ROUNDING Patient sleeping: Yes.   Patient alert and oriented: pt alert, but oriented to self and place, not oriented to time and situation Behavior appropriate: Yes.  ; If no, describe:  Nutrition and fluids offered: Yes  Toileting and hygiene offered: Yes  Sitter present: [employer]er present: yes

## 2015-01-10 NOTE — ED Notes (Signed)
BEHAVIORAL HEALTH ROUNDING Patient sleeping: No. Patient alert and oriented: yes Behavior appropriate: Yes.  ; If no, describe: pt walking around room and trying to walk in hallway; redirected. Nutrition and fluids offered: Yes  Toileting and hygiene offered: Yes  Sitter present: no Law enforcement present: Yes

## 2015-02-22 ENCOUNTER — Encounter: Payer: Self-pay | Admitting: *Deleted

## 2015-02-25 ENCOUNTER — Ambulatory Visit: Payer: Medicare Other | Admitting: Anesthesiology

## 2015-02-25 ENCOUNTER — Encounter
Admission: RE | Disposition: A | Discharge status: Home / Self Care | Payer: Self-pay | Source: Ambulatory Visit | Attending: Gastroenterology

## 2015-02-25 ENCOUNTER — Encounter: Payer: Self-pay | Admitting: *Deleted

## 2015-02-25 ENCOUNTER — Ambulatory Visit
Admission: RE | Admit: 2015-02-25 | Discharge: 2015-02-25 | Disposition: A | Discharge status: Home / Self Care | Payer: Medicare Other | Source: Ambulatory Visit | Attending: Gastroenterology | Admitting: Gastroenterology

## 2015-02-25 DIAGNOSIS — Z5309 Procedure and treatment not carried out because of other contraindication: Secondary | ICD-10-CM | POA: Diagnosis not present

## 2015-02-25 DIAGNOSIS — G4730 Sleep apnea, unspecified: Secondary | ICD-10-CM | POA: Diagnosis not present

## 2015-02-25 DIAGNOSIS — I4892 Unspecified atrial flutter: Secondary | ICD-10-CM | POA: Diagnosis not present

## 2015-02-25 DIAGNOSIS — E7800 Pure hypercholesterolemia, unspecified: Secondary | ICD-10-CM | POA: Diagnosis not present

## 2015-02-25 DIAGNOSIS — I4891 Unspecified atrial fibrillation: Secondary | ICD-10-CM | POA: Insufficient documentation

## 2015-02-25 DIAGNOSIS — I10 Essential (primary) hypertension: Secondary | ICD-10-CM | POA: Insufficient documentation

## 2015-02-25 DIAGNOSIS — F209 Schizophrenia, unspecified: Secondary | ICD-10-CM | POA: Insufficient documentation

## 2015-02-25 DIAGNOSIS — Z794 Long term (current) use of insulin: Secondary | ICD-10-CM | POA: Insufficient documentation

## 2015-02-25 DIAGNOSIS — K6389 Other specified diseases of intestine: Secondary | ICD-10-CM | POA: Diagnosis not present

## 2015-02-25 DIAGNOSIS — Z79899 Other long term (current) drug therapy: Secondary | ICD-10-CM | POA: Insufficient documentation

## 2015-02-25 DIAGNOSIS — R251 Tremor, unspecified: Secondary | ICD-10-CM | POA: Diagnosis not present

## 2015-02-25 DIAGNOSIS — Z1211 Encounter for screening for malignant neoplasm of colon: Secondary | ICD-10-CM | POA: Insufficient documentation

## 2015-02-25 DIAGNOSIS — F329 Major depressive disorder, single episode, unspecified: Secondary | ICD-10-CM | POA: Diagnosis not present

## 2015-02-25 DIAGNOSIS — E119 Type 2 diabetes mellitus without complications: Secondary | ICD-10-CM | POA: Insufficient documentation

## 2015-02-25 DIAGNOSIS — K219 Gastro-esophageal reflux disease without esophagitis: Secondary | ICD-10-CM | POA: Insufficient documentation

## 2015-02-25 HISTORY — DX: Gastro-esophageal reflux disease without esophagitis: K21.9

## 2015-02-25 HISTORY — PX: COLONOSCOPY WITH PROPOFOL: SHX5780

## 2015-02-25 HISTORY — DX: Major depressive disorder, single episode, unspecified: F32.9

## 2015-02-25 HISTORY — DX: Depression, unspecified: F32.A

## 2015-02-25 HISTORY — DX: Sleep apnea, unspecified: G47.30

## 2015-02-25 LAB — GLUCOSE, CAPILLARY
GLUCOSE-CAPILLARY: 63 mg/dL — AB (ref 65–99)
Glucose-Capillary: 87 mg/dL (ref 65–99)

## 2015-02-25 SURGERY — COLONOSCOPY WITH PROPOFOL
Anesthesia: General

## 2015-02-25 MED ORDER — PROPOFOL 10 MG/ML IV BOLUS
INTRAVENOUS | Status: DC | PRN
  Administered 2015-02-25: 60 mg via INTRAVENOUS
  Administered 2015-02-25: 40 mg via INTRAVENOUS

## 2015-02-25 MED ORDER — SODIUM CHLORIDE 0.9 % IV SOLN
INTRAVENOUS | Status: DC
  Administered 2015-02-25: 10:00:00 via INTRAVENOUS

## 2015-02-25 MED ORDER — PROPOFOL 500 MG/50ML IV EMUL
INTRAVENOUS | Status: DC | PRN
  Administered 2015-02-25: 200 ug/kg/min via INTRAVENOUS

## 2015-02-25 MED ORDER — FENTANYL CITRATE (PF) 100 MCG/2ML IJ SOLN
INTRAMUSCULAR | Status: DC | PRN
  Administered 2015-02-25: 50 ug via INTRAVENOUS

## 2015-02-25 MED ORDER — METOPROLOL SUCCINATE ER 25 MG PO TB24
25.0000 mg | ORAL_TABLET | Freq: Once | ORAL | Status: AC
  Administered 2015-02-25: 25 mg via ORAL

## 2015-02-25 MED ORDER — LIDOCAINE HCL (CARDIAC) 20 MG/ML IV SOLN
INTRAVENOUS | Status: DC | PRN
  Administered 2015-02-25: 60 mg via INTRAVENOUS
  Administered 2015-02-25: 40 mg via INTRAVENOUS

## 2015-02-25 NOTE — Discharge Instructions (Signed)

## 2015-02-25 NOTE — Op Note (Signed)
[facility] Patient Name: Lawrence Johnston Procedure Date: 02/25/2015 10:22 AM MRN: [id number] Account #: [id number] Date of Birth: Oct 03, 1960 Admit Type: Outpatient Age: 54 Room: [facility] ENDO ROOM 2 Gender: Male Note Status: Finalized Procedure:         Colonoscopy Indications:       Screening for colorectal malignant neoplasm, Last                     colonoscopy: date unknown (unable to locate last                     colonoscopy report) Patient Profile:   This is a 54 year old male. Providers:         Gerrit Heck. Rayann Heman, MD Referring MD:      Leonie Douglas. Doy Hutching, MD (Referring MD) Medicines:         Propofol per Anesthesia Complications:     No immediate complications. Procedure:         Pre-Anesthesia Assessment:                    - Prior to the procedure, a History and Physical was                     performed, and patient medications, allergies and                     sensitivities were reviewed. The patient's tolerance of                     previous anesthesia was reviewed.                    After obtaining informed consent, the colonoscope was                     passed under direct vision. Throughout the procedure, the                     patient's blood pressure, pulse, and oxygen saturations                     were monitored continuously. The Colonoscope was                     introduced through the anus with the intention of                     advancing to the cecum. The scope was advanced to the                     transverse colon before the procedure was aborted.                     Medications were not given. The colonoscopy was unusually                     difficult due to a tortuous colon. The quality of the                     bowel preparation was poor. Findings:      The perianal and digital rectal examinations were normal.      A large amount of semi-liquid stool was found in the entire colon,       interfering with  visualization.      -  Aborted in transverse colon Impression:        - Preparation of the colon was poor.                    - Stool in the entire examined colon.                    - Aborted in transverse colon                    - No specimens collected. Recommendation:    - Observe patient in GI recovery unit.                    - Continue present medications.                    - Repeat colonoscopy at appointment to be scheduled                     because the bowel preparation was poor.                    - Use two day prep, two days of clear liquids, use split                     prep ( requires drinking portion of prep morning of                     procedure) Procedure Code(s): --- Professional ---                    [phone], 53, Colonoscopy, flexible; diagnostic, including                     collection of specimen(s) by brushing or washing, when                     performed (separate procedure) CPT copyright 2014 American Medical Association. All rights reserved. The codes documented in this report are preliminary and upon coder review may  be revised to meet current compliance requirements. Mellody Life, MD 02/25/2015 10:54:10 AM This report has been signed electronically. Number of Addenda: 0 Note Initiated On: 02/25/2015 10:22 AM Total Procedure Duration: 0 hours 16 minutes 8 seconds       [facility]

## 2015-02-25 NOTE — Anesthesia Preprocedure Evaluation (Addendum)
Anesthesia Evaluation  Patient identified by MRN, date of birth, ID band Patient awake    Reviewed: Allergy & Precautions, H&P , NPO status , Patient's Chart, lab work & pertinent test results, reviewed documented beta blocker date and time   History of Anesthesia Complications Negative for: history of anesthetic complications  Airway Mallampati: III  TM Distance: >3 FB Neck ROM: full    Dental no notable dental hx. (+) Edentulous Upper, Edentulous Lower   Pulmonary neg shortness of breath, sleep apnea , neg COPD, neg recent URI,    Pulmonary exam normal breath sounds clear to auscultation       Cardiovascular Exercise Tolerance: Good hypertension, (-) angina(-) CAD, (-) Past MI, (-) Cardiac Stents and (-) CABG Normal cardiovascular exam+ dysrhythmias (currently in NSR) Atrial Fibrillation (-) Valvular Problems/Murmurs Rhythm:regular Rate:Normal     Neuro/Psych Seizures -, Well Controlled,  PSYCHIATRIC DISORDERS (schizophrenia and depression) Essential tremor    GI/Hepatic negative GI ROS, Neg liver ROS, GERD  ,  Endo/Other  diabetes, Well Controlled, Oral Hypoglycemic Agents  Renal/GU negative Renal ROS  negative genitourinary   Musculoskeletal   Abdominal   Peds  Hematology negative hematology ROS (+)   Anesthesia Other Findings Past Medical History:   Hypercholesteremia                                           Tremor                                                       Diabetes (HCC)                                               Schizophrenia (HCC)                                          Hypertension                                                 Depression                                                   GERD (gastroesophageal reflux disease)                       Sleep apnea                                                  Arrhythmia  Comment:Atrial  Fibrillation; Atrial Flutter w/RVR; SVT   Reproductive/Obstetrics negative OB ROS                            Anesthesia Physical Anesthesia Plan  ASA: III  Anesthesia Plan: General   Post-op Pain Management:    Induction:   Airway Management Planned:   Additional Equipment:   Intra-op Plan:   Post-operative Plan:   Informed Consent: I have reviewed the patients History and Physical, chart, labs and discussed the procedure including the risks, benefits and alternatives for the proposed anesthesia with the patient or authorized representative who has indicated his/her understanding and acceptance.   Dental Advisory Given  Plan Discussed with: Anesthesiologist, CRNA and Surgeon  Anesthesia Plan Comments:         Anesthesia Quick Evaluation

## 2015-02-25 NOTE — Transfer of Care (Signed)
Immediate Anesthesia Transfer of Care Note  Patient: Lawrence Johnston  Procedure(s) Performed: Procedure(s): COLONOSCOPY WITH PROPOFOL (N/A)  Patient Location: PACU  Anesthesia Type:General  Level of Consciousness: awake  Airway & Oxygen Therapy: Patient Spontanous Breathing and Patient connected to nasal cannula oxygen  Post-op Assessment: Report given to RN and Post -op Vital signs reviewed and stable  Post vital signs: stable  Last Vitals:  Filed Vitals:   02/25/15 1057  BP: 93/48  Pulse: 63  Temp: 36.4 C  Resp: 17    Complications: No apparent anesthesia complications

## 2015-02-25 NOTE — H&P (Signed)
Primary Care Physician:  Idelle Crouch, MD  Pre-Procedure History & Physical: HPI:  Lawrence Johnston is a 54 y.o. male is here for an colonoscopy.   Past Medical History  Diagnosis Date  . Hypercholesteremia   . Tremor   . Diabetes ([place])   . Schizophrenia ([place])   . Hypertension   . Depression   . GERD (gastroesophageal reflux disease)   . Sleep apnea   . Arrhythmia     Atrial Fibrillation; Atrial Flutter w/RVR; SVT    Past Surgical History  Procedure Laterality Date  . Cardioversion      x2, a-fib    Prior to Admission medications   Medication Sig Start Date End Date Taking? Authorizing Provider  benztropine (COGENTIN) 1 MG tablet Take 1 mg by mouth 3 (three) times daily.   Yes Historical Provider, MD  divalproex (DEPAKOTE) 500 MG DR tablet Take 500 mg by mouth at bedtime. 4 tablets at bedtime   Yes Historical Provider, MD  FLUoxetine (PROZAC) 20 MG tablet Take 20 mg by mouth daily.   Yes Historical Provider, MD  fluPHENAZine (PROLIXIN) 5 MG tablet Take 5 mg by mouth 2 (two) times daily.   Yes Historical Provider, MD  furosemide (LASIX) 40 MG tablet Take 40 mg by mouth.   Yes Historical Provider, MD  gabapentin (NEURONTIN) 300 MG capsule Take 300 mg by mouth 2 (two) times daily.   Yes Historical Provider, MD  glyBURIDE-metformin (GLUCOVANCE) 2.5-500 MG per tablet Take 1 tablet by mouth 2 (two) times daily with a meal.   Yes Historical Provider, MD  insulin aspart (NOVOLOG) 100 UNIT/ML injection Inject into the skin once. Sliding scale   Yes Historical Provider, MD  LORazepam (ATIVAN) 0.5 MG tablet Take 0.5 mg by mouth every morning.    Yes Historical Provider, MD  metoprolol succinate (TOPROL-XL) 25 MG 24 hr tablet Take 25 mg by mouth daily.   Yes Historical Provider, MD  pantoprazole (PROTONIX) 40 MG tablet Take 40 mg by mouth daily.   Yes Historical Provider, MD  polyethylene glycol (MIRALAX / GLYCOLAX) packet Take 17 g by mouth daily.   Yes Historical Provider, MD   QUEtiapine (SEROQUEL) 400 MG tablet Take 400 mg by mouth at bedtime.   Yes Historical Provider, MD  rOPINIRole (REQUIP) 2 MG tablet Take 2 mg by mouth at bedtime.   Yes Historical Provider, MD  rosuvastatin (CRESTOR) 20 MG tablet Take 20 mg by mouth daily.   Yes Historical Provider, MD  docusate sodium (COLACE) 100 MG capsule Take 100 mg by mouth daily.    Historical Provider, MD    Allergies as of 01/30/2015  . (No Known Allergies)    History reviewed. No pertinent family history.  Social History   Social History  . Marital Status: Single    Spouse Name: N/A  . Number of Children: N/A  . Years of Education: N/A   Occupational History  . Not on file.   Social History Main Topics  . Smoking status: Never Smoker   . Smokeless tobacco: Never Used  . Alcohol Use: No  . Drug Use: No  . Sexual Activity: Not on file   Other Topics Concern  . Not on file   Social History Narrative     Physical Exam: BP 140/100 mmHg  Pulse 68  Temp(Src) 98.8 F (37.1 C) (Tympanic)  Ht 5\' 9"  (1.753 m)  Wt 108.863 kg (240 lb)  BMI 35.43 kg/m2  SpO2 100% General:   Alert,  pleasant and  cooperative in NAD Head:  Normocephalic and atraumatic. Neck:  Supple; no masses or thyromegaly. Lungs:  Clear throughout to auscultation.    Heart:  Regular rate and rhythm. Abdomen:  Soft, nontender and nondistended. Normal bowel sounds, without guarding, and without rebound.   Neurologic:  Alert and  oriented x3, + tremor  Impression/Plan: [name] is here for an colonoscopy to be performed for screening  Risks, benefits, limitations, and alternatives regarding  colonoscopy have been reviewed with the patient.  Questions have been answered.  All parties agreeable.   Josefine Class, MD  02/25/2015, 10:14 AM

## 2015-02-26 NOTE — Anesthesia Postprocedure Evaluation (Signed)
  Anesthesia Post-op Note  Patient: Lawrence Johnston  Procedure(s) Performed: Procedure(s): COLONOSCOPY WITH PROPOFOL (N/A)  Anesthesia type:General  Patient location: PACU  Post pain: Pain level controlled  Post assessment: Post-op Vital signs reviewed, Patient's Cardiovascular Status Stable, Respiratory Function Stable, Patent Airway and No signs of Nausea or vomiting  Post vital signs: Reviewed and stable  Last Vitals:  Filed Vitals:   02/25/15 1130  BP: 113/71  Pulse: 67  Temp:   Resp: 23    Level of consciousness: awake, alert  and patient cooperative  Complications: No apparent anesthesia complications

## 2015-02-28 ENCOUNTER — Encounter: Payer: Self-pay | Admitting: Gastroenterology

## 2015-04-11 ENCOUNTER — Encounter: Payer: Self-pay | Admitting: *Deleted

## 2015-04-12 ENCOUNTER — Ambulatory Visit
Admission: RE | Admit: 2015-04-12 | Discharge: 2015-04-12 | Disposition: A | Discharge status: Home / Self Care | Payer: Medicare Other | Source: Ambulatory Visit | Attending: Gastroenterology | Admitting: Gastroenterology

## 2015-04-12 ENCOUNTER — Ambulatory Visit: Payer: Medicare Other | Admitting: Anesthesiology

## 2015-04-12 ENCOUNTER — Encounter
Admission: RE | Disposition: A | Discharge status: Home / Self Care | Payer: Self-pay | Source: Ambulatory Visit | Attending: Gastroenterology

## 2015-04-12 ENCOUNTER — Encounter: Payer: Self-pay | Admitting: *Deleted

## 2015-04-12 DIAGNOSIS — I4891 Unspecified atrial fibrillation: Secondary | ICD-10-CM | POA: Diagnosis not present

## 2015-04-12 DIAGNOSIS — G4730 Sleep apnea, unspecified: Secondary | ICD-10-CM | POA: Diagnosis not present

## 2015-04-12 DIAGNOSIS — F329 Major depressive disorder, single episode, unspecified: Secondary | ICD-10-CM | POA: Insufficient documentation

## 2015-04-12 DIAGNOSIS — F209 Schizophrenia, unspecified: Secondary | ICD-10-CM | POA: Diagnosis not present

## 2015-04-12 DIAGNOSIS — K640 First degree hemorrhoids: Secondary | ICD-10-CM | POA: Diagnosis not present

## 2015-04-12 DIAGNOSIS — Z79899 Other long term (current) drug therapy: Secondary | ICD-10-CM | POA: Insufficient documentation

## 2015-04-12 DIAGNOSIS — I10 Essential (primary) hypertension: Secondary | ICD-10-CM | POA: Insufficient documentation

## 2015-04-12 DIAGNOSIS — I4892 Unspecified atrial flutter: Secondary | ICD-10-CM | POA: Insufficient documentation

## 2015-04-12 DIAGNOSIS — I471 Supraventricular tachycardia: Secondary | ICD-10-CM | POA: Insufficient documentation

## 2015-04-12 DIAGNOSIS — E119 Type 2 diabetes mellitus without complications: Secondary | ICD-10-CM | POA: Insufficient documentation

## 2015-04-12 DIAGNOSIS — Z1211 Encounter for screening for malignant neoplasm of colon: Secondary | ICD-10-CM | POA: Diagnosis present

## 2015-04-12 DIAGNOSIS — K219 Gastro-esophageal reflux disease without esophagitis: Secondary | ICD-10-CM | POA: Diagnosis not present

## 2015-04-12 DIAGNOSIS — Z794 Long term (current) use of insulin: Secondary | ICD-10-CM | POA: Insufficient documentation

## 2015-04-12 DIAGNOSIS — R251 Tremor, unspecified: Secondary | ICD-10-CM | POA: Diagnosis not present

## 2015-04-12 DIAGNOSIS — E7800 Pure hypercholesterolemia, unspecified: Secondary | ICD-10-CM | POA: Diagnosis not present

## 2015-04-12 HISTORY — PX: COLONOSCOPY WITH PROPOFOL: SHX5780

## 2015-04-12 SURGERY — COLONOSCOPY WITH PROPOFOL
Anesthesia: General

## 2015-04-12 MED ORDER — SODIUM CHLORIDE 0.9 % IV SOLN
INTRAVENOUS | Status: DC
  Administered 2015-04-12: 1000 mL via INTRAVENOUS

## 2015-04-12 MED ORDER — FENTANYL CITRATE (PF) 100 MCG/2ML IJ SOLN
INTRAMUSCULAR | Status: DC | PRN
  Administered 2015-04-12: 50 ug via INTRAVENOUS

## 2015-04-12 MED ORDER — MIDAZOLAM HCL 5 MG/5ML IJ SOLN
INTRAMUSCULAR | Status: DC | PRN
  Administered 2015-04-12: 1 mg via INTRAVENOUS

## 2015-04-12 MED ORDER — PROPOFOL 10 MG/ML IV BOLUS
INTRAVENOUS | Status: DC | PRN
  Administered 2015-04-12: 50 mg via INTRAVENOUS
  Administered 2015-04-12: 20 mg via INTRAVENOUS

## 2015-04-12 MED ORDER — PROPOFOL 500 MG/50ML IV EMUL
INTRAVENOUS | Status: DC | PRN
  Administered 2015-04-12: 150 ug/kg/min via INTRAVENOUS

## 2015-04-12 NOTE — H&P (Signed)
Primary Care Physician:  Idelle Crouch, MD  Pre-Procedure History & Physical: HPI:  Lawrence Johnston is a 54 y.o. male is here for an colonoscopy.   Past Medical History  Diagnosis Date  . Hypercholesteremia   . Tremor   . Diabetes ([place])   . Schizophrenia ([place])   . Hypertension   . Depression   . GERD (gastroesophageal reflux disease)   . Sleep apnea   . Arrhythmia     Atrial Fibrillation; Atrial Flutter w/RVR; SVT    Past Surgical History  Procedure Laterality Date  . Cardioversion      x2, a-fib  . Colonoscopy with propofol N/A 02/25/2015    Procedure: COLONOSCOPY WITH PROPOFOL;  Surgeon: Josefine Class, MD;  Location: [facility] ENDOSCOPY;  Service: Endoscopy;  Laterality: N/A;    Prior to Admission medications   Medication Sig Start Date End Date Taking? Authorizing Provider  benztropine (COGENTIN) 1 MG tablet Take 1 mg by mouth 3 (three) times daily.   Yes Historical Provider, MD  divalproex (DEPAKOTE) 500 MG DR tablet Take 500 mg by mouth at bedtime. 4 tablets at bedtime   Yes Historical Provider, MD  docusate sodium (COLACE) 100 MG capsule Take 100 mg by mouth daily.   Yes Historical Provider, MD  FLUoxetine (PROZAC) 20 MG tablet Take 20 mg by mouth daily.   Yes Historical Provider, MD  fluPHENAZine (PROLIXIN) 5 MG tablet Take 5 mg by mouth 2 (two) times daily.   Yes Historical Provider, MD  furosemide (LASIX) 40 MG tablet Take 40 mg by mouth.   Yes Historical Provider, MD  gabapentin (NEURONTIN) 300 MG capsule Take 300 mg by mouth 2 (two) times daily.   Yes Historical Provider, MD  glyBURIDE-metformin (GLUCOVANCE) 2.5-500 MG per tablet Take 1 tablet by mouth 2 (two) times daily with a meal.   Yes Historical Provider, MD  insulin aspart (NOVOLOG) 100 UNIT/ML injection Inject into the skin once. Sliding scale   Yes Historical Provider, MD  LORazepam (ATIVAN) 0.5 MG tablet Take 0.5 mg by mouth every morning.    Yes Historical Provider, MD  metoprolol succinate  (TOPROL-XL) 25 MG 24 hr tablet Take 25 mg by mouth daily.   Yes Historical Provider, MD  pantoprazole (PROTONIX) 40 MG tablet Take 40 mg by mouth daily.   Yes Historical Provider, MD  polyethylene glycol (MIRALAX / GLYCOLAX) packet Take 17 g by mouth daily.   Yes Historical Provider, MD  QUEtiapine (SEROQUEL) 400 MG tablet Take 400 mg by mouth at bedtime.   Yes Historical Provider, MD  rOPINIRole (REQUIP) 2 MG tablet Take 2 mg by mouth at bedtime.    Historical Provider, MD  rosuvastatin (CRESTOR) 20 MG tablet Take 20 mg by mouth daily.    Historical Provider, MD    Allergies as of 03/26/2015  . (No Known Allergies)    History reviewed. No pertinent family history.  Social History   Social History  . Marital Status: Single    Spouse Name: N/A  . Number of Children: N/A  . Years of Education: N/A   Occupational History  . Not on file.   Social History Main Topics  . Smoking status: Never Smoker   . Smokeless tobacco: Never Used  . Alcohol Use: No  . Drug Use: No  . Sexual Activity: Not on file   Other Topics Concern  . Not on file   Social History Narrative     Physical Exam: BP 138/72 mmHg  Pulse 94  Temp(Src) 98.9  F (37.2 C) (Tympanic)  Resp 20  Ht 5\' 9"  (1.753 m)  Wt 108.863 kg (240 lb)  BMI 35.43 kg/m2  SpO2 100% General:   Alert,  pleasant and cooperative in NAD Head:  Normocephalic and atraumatic. Neck:  Supple; no masses or thyromegaly. Lungs:  Clear throughout to auscultation.    Heart:  Regular rate and rhythm. Abdomen:  Soft, nontender and nondistended. Normal bowel sounds, without guarding, and without rebound.   Neurologic:  Alert and  oriented x4;  grossly normal neurologically.  Impression/Plan: Lawrence Johnston is here for an colonoscopy to be performed for screenning  Risks, benefits, limitations, and alternatives regarding  colonoscopy have been reviewed with the patient.  Questions have been answered.  All parties agreeable.   Josefine Class, MD  04/12/2015, 11:25 AM

## 2015-04-12 NOTE — Transfer of Care (Signed)
Immediate Anesthesia Transfer of Care Note  Patient: Lawrence Johnston  Procedure(s) Performed: Procedure(s): COLONOSCOPY WITH PROPOFOL (N/A)  Patient Location: PACU  Anesthesia Type:General  Level of Consciousness: awake, alert , oriented and patient cooperative  Airway & Oxygen Therapy: Patient Spontanous Breathing and Patient connected to nasal cannula oxygen  Post-op Assessment: Report given to RN, Post -op Vital signs reviewed and stable and Patient moving all extremities  Post vital signs: Reviewed and stable  Last Vitals:  Filed Vitals:   04/12/15 1102 04/12/15 1215  BP: 138/72 110/91  Pulse: 94 78  Temp: 37.2 C 36.1 C  Resp: 20 20    Complications: No apparent anesthesia complications

## 2015-04-12 NOTE — OR Nursing (Signed)
Left IV left wrist removed.  IV site clean dry and dressing intact.  Iv catheter intact when removed.

## 2015-04-12 NOTE — Anesthesia Postprocedure Evaluation (Signed)
Anesthesia Post Note  Patient: Lawrence Johnston  Procedure(s) Performed: Procedure(s) (LRB): COLONOSCOPY WITH PROPOFOL (N/A)  Patient location during evaluation: Endoscopy Anesthesia Type: General Level of consciousness: awake and alert Pain management: pain level controlled Vital Signs Assessment: post-procedure vital signs reviewed and stable Respiratory status: spontaneous breathing, nonlabored ventilation, respiratory function stable and patient connected to nasal cannula oxygen Cardiovascular status: blood pressure returned to baseline and stable Postop Assessment: no signs of nausea or vomiting Anesthetic complications: no    Last Vitals:  Filed Vitals:   04/12/15 1235 04/12/15 1245  BP: 120/51 125/84  Pulse: 72 73  Temp:    Resp: 22     Last Pain:  Filed Vitals:   04/12/15 1249  PainSc: [place]

## 2015-04-12 NOTE — Discharge Instructions (Signed)

## 2015-04-12 NOTE — Op Note (Signed)
[facility] Patient Name: Lawrence Johnston Procedure Date: 04/12/2015 11:28 AM MRN: [id number] Account #: [id number] Date of Birth: [birth year] Admit Type: Outpatient Age: 54 Room: [facility] ENDO ROOM 3 Gender: Male Note Status: Finalized Procedure:         Colonoscopy Indications:       Screening for colorectal malignant neoplasm, This is the                     patient's first colonoscopy Patient Profile:   This is a 54 year old male. Providers:         Gerrit Heck. Rayann Heman, MD Referring MD:      Leonie Douglas. Doy Hutching, MD (Referring MD) Medicines:         Propofol per Anesthesia Complications:     No immediate complications. Procedure:         Pre-Anesthesia Assessment:                    - Prior to the procedure, a History and Physical was                     performed, and patient medications, allergies and                     sensitivities were reviewed. The patient's tolerance of                     previous anesthesia was reviewed.                    After obtaining informed consent, the colonoscope was                     passed under direct vision. Throughout the procedure, the                     patient's blood pressure, pulse, and oxygen saturations                     were monitored continuously. The Colonoscope was                     introduced through the anus and advanced to the the cecum,                     identified by appendiceal orifice and ileocecal valve. The                     colonoscopy was somewhat difficult due to a tortuous                     colon. Successful completion of the procedure was aided by                     using manual pressure. The patient tolerated the procedure                     well. The quality of the bowel preparation was excellent. Findings:      The perianal and digital rectal examinations were normal.      Internal hemorrhoids were found during retroflexion. The hemorrhoids       were Grade I (internal  hemorrhoids that do not prolapse).      The exam was otherwise without abnormality. Impression:        -  Internal hemorrhoids.                    - The examination was otherwise normal.                    - No specimens collected. Recommendation:    - Observe patient in GI recovery unit.                    - Continue present medications.                    - Repeat colonoscopy in 10 years for screening purposes.                    - Return to referring physician.                    - The findings and recommendations were discussed with the                     patient.                    - The findings and recommendations were discussed with the                     patient's family. Procedure Code(s): --- Professional ---                    [phone], Colonoscopy, flexible; diagnostic, including                     collection of specimen(s) by brushing or washing, when                     performed (separate procedure) Diagnosis Code(s): --- Professional ---                    Z12.11, Encounter for screening for malignant neoplasm of                     colon                    K64.0, First degree hemorrhoids CPT copyright 2014 [employer]. All rights reserved. The codes documented in this report are preliminary and upon coder review may  be revised to meet current compliance requirements. Mellody Life, MD 04/12/2015 12:13:18 PM This report has been signed electronically. Number of Addenda: 0 Note Initiated On: 04/12/2015 11:28 AM Scope Withdrawal Time: 0 hours 14 minutes 22 seconds  Total Procedure Duration: 0 hours 36 minutes 13 seconds       [facility]

## 2015-04-12 NOTE — Anesthesia Preprocedure Evaluation (Signed)
Anesthesia Evaluation  Patient identified by MRN, date of birth, ID band Patient awake    Reviewed: Allergy & Precautions, H&P , NPO status , Patient's Chart, lab work & pertinent test results  History of Anesthesia Complications Negative for: history of anesthetic complications  Airway Mallampati: III  TM Distance: >3 FB Neck ROM: limited    Dental  (+) Poor Dentition, Chipped, Missing   Pulmonary neg shortness of breath, sleep apnea ,    Pulmonary exam normal breath sounds clear to auscultation       Cardiovascular Exercise Tolerance: Poor hypertension, (-) angina+ DOE  (-) Past MI Normal cardiovascular exam+ dysrhythmias  Rhythm:regular Rate:Normal     Neuro/Psych PSYCHIATRIC DISORDERS Depression Schizophrenia negative neurological ROS     GI/Hepatic Neg liver ROS, GERD  ,  Endo/Other  diabetes, Type 2  Renal/GU negative Renal ROS  negative genitourinary   Musculoskeletal negative musculoskeletal ROS (+)   Abdominal   Peds negative pediatric ROS (+)  Hematology negative hematology ROS (+)   Anesthesia Other Findings Past Medical History:   Hypercholesteremia                                           Tremor                                                       Diabetes ([facility])                                               Schizophrenia ([facility])                                          Hypertension                                                 Depression                                                   GERD (gastroesophageal reflux disease)                       Sleep apnea                                                  Arrhythmia  Comment:Atrial Fibrillation; Atrial Flutter w/RVR; SVT  Past Surgical History:   CARDIOVERSION                                                   Comment:x2, a-fib   COLONOSCOPY WITH PROPOFOL                       N/A  02/25/2015     Comment:Procedure: COLONOSCOPY WITH PROPOFOL;  Surgeon:              Josefine Class, MD;  Location: [facility]               ENDOSCOPY;  Service: Endoscopy;  Laterality:               N/A;  BMI    Body Mass Index   35.42 kg/m 2      Reproductive/Obstetrics negative OB ROS                             Anesthesia Physical Anesthesia Plan  ASA: IV  Anesthesia Plan: General   Post-op Pain Management:    Induction:   Airway Management Planned:   Additional Equipment:   Intra-op Plan:   Post-operative Plan:   Informed Consent: I have reviewed the patients History and Physical, chart, labs and discussed the procedure including the risks, benefits and alternatives for the proposed anesthesia with the patient or authorized representative who has indicated his/her understanding and acceptance.   Dental Advisory Given  Plan Discussed with: Anesthesiologist, CRNA and Surgeon  Anesthesia Plan Comments:         Anesthesia Quick Evaluation

## 2015-04-13 ENCOUNTER — Encounter: Payer: Self-pay | Admitting: Gastroenterology

## 2015-06-06 ENCOUNTER — Encounter: Payer: Self-pay | Admitting: *Deleted

## 2015-06-06 ENCOUNTER — Emergency Department: Payer: Medicare Other

## 2015-06-06 ENCOUNTER — Emergency Department
Admission: EM | Admit: 2015-06-06 | Discharge: 2015-06-06 | Disposition: A | Discharge status: Home / Self Care | Payer: Medicare Other | Attending: Emergency Medicine | Admitting: Emergency Medicine

## 2015-06-06 DIAGNOSIS — Y998 Other external cause status: Secondary | ICD-10-CM | POA: Insufficient documentation

## 2015-06-06 DIAGNOSIS — W19XXXA Unspecified fall, initial encounter: Secondary | ICD-10-CM

## 2015-06-06 DIAGNOSIS — R410 Disorientation, unspecified: Secondary | ICD-10-CM | POA: Insufficient documentation

## 2015-06-06 DIAGNOSIS — Z79899 Other long term (current) drug therapy: Secondary | ICD-10-CM | POA: Diagnosis not present

## 2015-06-06 DIAGNOSIS — W1839XA Other fall on same level, initial encounter: Secondary | ICD-10-CM | POA: Insufficient documentation

## 2015-06-06 DIAGNOSIS — S0990XA Unspecified injury of head, initial encounter: Secondary | ICD-10-CM | POA: Insufficient documentation

## 2015-06-06 DIAGNOSIS — Z794 Long term (current) use of insulin: Secondary | ICD-10-CM | POA: Insufficient documentation

## 2015-06-06 DIAGNOSIS — Y92129 Unspecified place in nursing home as the place of occurrence of the external cause: Secondary | ICD-10-CM | POA: Insufficient documentation

## 2015-06-06 DIAGNOSIS — E119 Type 2 diabetes mellitus without complications: Secondary | ICD-10-CM | POA: Insufficient documentation

## 2015-06-06 DIAGNOSIS — I10 Essential (primary) hypertension: Secondary | ICD-10-CM | POA: Diagnosis not present

## 2015-06-06 DIAGNOSIS — R4182 Altered mental status, unspecified: Secondary | ICD-10-CM | POA: Diagnosis present

## 2015-06-06 DIAGNOSIS — Y9389 Activity, other specified: Secondary | ICD-10-CM | POA: Diagnosis not present

## 2015-06-06 LAB — COMPREHENSIVE METABOLIC PANEL
ALT: 27 U/L (ref 17–63)
AST: 40 U/L (ref 15–41)
Albumin: 3.9 g/dL (ref 3.5–5.0)
Alkaline Phosphatase: 59 U/L (ref 38–126)
Anion gap: 7 (ref 5–15)
BUN: 19 mg/dL (ref 6–20)
CHLORIDE: 105 mmol/L (ref 101–111)
CO2: 29 mmol/L (ref 22–32)
Calcium: 9.2 mg/dL (ref 8.9–10.3)
Creatinine, Ser: 1.8 mg/dL — ABNORMAL HIGH (ref 0.61–1.24)
GFR, EST AFRICAN AMERICAN: 48 mL/min — AB (ref >60)
GFR, EST NON AFRICAN AMERICAN: 41 mL/min — AB (ref >60)
Glucose, Bld: 99 mg/dL (ref 65–99)
POTASSIUM: 4.4 mmol/L (ref 3.5–5.1)
SODIUM: 141 mmol/L (ref 135–145)
Total Bilirubin: 0.6 mg/dL (ref 0.3–1.2)
Total Protein: 7.2 g/dL (ref 6.5–8.1)

## 2015-06-06 LAB — CBC
HEMATOCRIT: 39.2 % — AB (ref 40.0–52.0)
Hemoglobin: 13.1 g/dL (ref 13.0–18.0)
MCH: 30.7 pg (ref 26.0–34.0)
MCHC: 33.3 g/dL (ref 32.0–36.0)
MCV: 92.3 fL (ref 80.0–100.0)
Platelets: 423 10*3/uL (ref 150–440)
RBC: 4.25 MIL/uL — AB (ref 4.40–5.90)
RDW: 14.8 % — ABNORMAL HIGH (ref 11.5–14.5)
WBC: 8.2 10*3/uL (ref 3.8–10.6)

## 2015-06-06 LAB — URINALYSIS COMPLETE WITH MICROSCOPIC (ARMC ONLY)
BACTERIA UA: NONE SEEN
Bilirubin Urine: NEGATIVE
Glucose, UA: NEGATIVE mg/dL
HGB URINE DIPSTICK: NEGATIVE
Ketones, ur: NEGATIVE mg/dL
LEUKOCYTES UA: NEGATIVE
Nitrite: NEGATIVE
PH: 5 (ref 5.0–8.0)
PROTEIN: NEGATIVE mg/dL
SPECIFIC GRAVITY, URINE: 1.017 (ref 1.005–1.030)
SQUAMOUS EPITHELIAL / LPF: NONE SEEN

## 2015-06-06 LAB — TROPONIN I: TROPONIN I: 0.03 ng/mL (ref <0.031)

## 2015-06-06 LAB — CK: Total CK: 136 U/L (ref 49–397)

## 2015-06-06 NOTE — ED Provider Notes (Signed)
[facility] Emergency Department Provider Note   ____________________________________________  Time seen: ~1505  I have reviewed the triage vital signs and the nursing notes.   HISTORY  Chief Complaint Fall and Altered Mental Status   History limited by: Not Limited   HPI Rylee Toole is a 55 y.o. male who presents from group home today because of concern for fall. The patient was found on the floor today by his mother when she came to visit. The patient states that he had three falls today, only witnessed by his roommate. The patient is not sure why he fell, thinks he might have passed out. Thinks he might have been out for four minutes. The patient states he has had a little pain since the fall to his head. It is mild. It has been constant. He denies any chest pain or shortness of breath around the time of the fall. Caregiver states that the patient was acting confused yesterday, however denies any symptoms of that nature today. No fevers.    Past Medical History  Diagnosis Date  . Hypercholesteremia   . Tremor   . Diabetes ([place])   . Schizophrenia ([place])   . Hypertension   . Depression   . GERD (gastroesophageal reflux disease)   . Sleep apnea   . Arrhythmia     Atrial Fibrillation; Atrial Flutter w/RVR; SVT    Patient Active Problem List   Diagnosis Date Noted  . 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 Schizophrenia ([place]) 06/19/2013    Past Surgical History  Procedure Laterality Date  . Cardioversion      x2, a-fib  . Colonoscopy with propofol N/A 02/25/2015    Procedure: COLONOSCOPY WITH PROPOFOL;  Surgeon: Josefine Class, MD;  Location: [facility] ENDOSCOPY;  Service: Endoscopy;  Laterality: N/A;  . Colonoscopy with propofol N/A 04/12/2015    Procedure: COLONOSCOPY WITH PROPOFOL;  Surgeon: Josefine Class, MD;  Location: [facility] ENDOSCOPY;  Service:  Endoscopy;  Laterality: N/A;    Current Outpatient Rx  Name  Route  Sig  Dispense  Refill  . benztropine (COGENTIN) 1 MG tablet   Oral   Take 1 mg by mouth 3 (three) times daily.         . divalproex (DEPAKOTE) 500 MG DR tablet   Oral   Take 500 mg by mouth at bedtime. 4 tablets at bedtime         . docusate sodium (COLACE) 100 MG capsule   Oral   Take 100 mg by mouth daily.         Marland Kitchen FLUoxetine (PROZAC) 20 MG tablet   Oral   Take 20 mg by mouth daily.         . fluPHENAZine (PROLIXIN) 5 MG tablet   Oral   Take 5 mg by mouth 2 (two) times daily.         . furosemide (LASIX) 40 MG tablet   Oral   Take 40 mg by mouth.         . gabapentin (NEURONTIN) 300 MG capsule   Oral   Take 300 mg by mouth 2 (two) times daily.         Marland Kitchen glyBURIDE-metformin (GLUCOVANCE) 2.5-500 MG per tablet   Oral   Take 1 tablet by mouth 2 (two) times daily with a meal.         . insulin aspart (NOVOLOG) 100 UNIT/ML injection   Subcutaneous  Inject into the skin once. Sliding scale         . LORazepam (ATIVAN) 0.5 MG tablet   Oral   Take 0.5 mg by mouth every morning.          . metoprolol succinate (TOPROL-XL) 25 MG 24 hr tablet   Oral   Take 25 mg by mouth daily.         . pantoprazole (PROTONIX) 40 MG tablet   Oral   Take 40 mg by mouth daily.         . polyethylene glycol (MIRALAX / GLYCOLAX) packet   Oral   Take 17 g by mouth daily.         . QUEtiapine (SEROQUEL) 400 MG tablet   Oral   Take 400 mg by mouth at bedtime.         Marland Kitchen rOPINIRole (REQUIP) 2 MG tablet   Oral   Take 2 mg by mouth at bedtime.         . rosuvastatin (CRESTOR) 20 MG tablet   Oral   Take 20 mg by mouth daily.           Allergies Review of patient's allergies indicates no known allergies.  History reviewed. No pertinent family history.  Social History Social History  Substance Use Topics  . Smoking status: Never Smoker   . Smokeless tobacco: Never Used  .  Alcohol Use: No    Review of Systems  Constitutional: Negative for fever. Cardiovascular: Negative for chest pain. Respiratory: Negative for shortness of breath. Gastrointestinal: Negative for abdominal pain, vomiting and diarrhea. Neurological: Positive for headache.   10-point ROS otherwise negative.  ____________________________________________   PHYSICAL EXAM:  VITAL SIGNS: ED Triage Vitals  Enc Vitals Group     BP 06/06/15 1141 117/60 mmHg     Pulse Rate 06/06/15 1141 65     Resp 06/06/15 1141 18     Temp 06/06/15 1141 98.4 F (36.9 C)     Temp Source 06/06/15 1141 Oral     SpO2 06/06/15 1141 98 %     Weight 06/06/15 1141 240 lb (108.863 kg)     Height 06/06/15 1141 5\' 9"  (1.753 m)   Constitutional: Alert and oriented. Well appearing and in no distress. Eyes: Conjunctivae are normal. PERRL. Normal extraocular movements. ENT   Head: Normocephalic. Small abrasion to forehead.   Nose: No congestion/rhinnorhea.   Mouth/Throat: Mucous membranes are moist.   Neck: No stridor. No midline tenderness. Hematological/Lymphatic/Immunilogical: No cervical lymphadenopathy. Cardiovascular: Normal rate, regular rhythm.  No murmurs, rubs, or gallops. Respiratory: Normal respiratory effort without tachypnea nor retractions. Breath sounds are clear and equal bilaterally. No wheezes/rales/rhonchi. Gastrointestinal: Soft and nontender. No distention. There is no CVA tenderness. Genitourinary: Deferred Musculoskeletal: Normal range of motion in all extremities. No joint effusions.  No lower extremity tenderness nor edema. No spinal tenderness.  Neurologic:  Normal speech and language. No gross focal neurologic deficits are appreciated.  Skin:  Small abrasion to forehead. Psychiatric: Mood and affect are normal. Speech and behavior are normal. Patient exhibits appropriate insight and judgment.  ____________________________________________    LABS (pertinent  positives/negatives)  Labs Reviewed  COMPREHENSIVE METABOLIC PANEL - Abnormal; Notable for the following:    Creatinine, Ser 1.80 (*)    GFR calc non Af Amer 41 (*)    GFR calc Af Amer 48 (*)    All other components within normal limits  CBC - Abnormal; Notable for the following:    RBC 4.25 (*)  HCT 39.2 (*)    RDW 14.8 (*)    All other components within normal limits  URINALYSIS COMPLETEWITH MICROSCOPIC ([facility] ONLY) - Abnormal; Notable for the following:    Color, Urine YELLOW (*)    APPearance CLEAR (*)    All other components within normal limits  TROPONIN I  CK     ____________________________________________   EKG  I, Nance Pear, attending physician, personally viewed and interpreted this EKG  EKG Time: 1518 Rate: 64 Rhythm: normal sinus rhythm Axis: normal Intervals: qtc 474 QRS: narrow, q waves V1, V2, V3, V4 ST changes: no st elevation Impression: abnormal ekg  q waves present on ekg dated 01/09/2015  ____________________________________________    RADIOLOGY  CT head IMPRESSION: Mild atrophic changes without acute abnormality.   ____________________________________________   PROCEDURES  Procedure(s) performed: None  Critical Care performed: No  ____________________________________________   INITIAL IMPRESSION / ASSESSMENT AND PLAN / ED COURSE  Pertinent labs & imaging results that were available during my care of the patient were reviewed by me and considered in my medical decision making (see chart for details).  Patient presented to the emergency department today after a fall and some confusion yesterday. No traumatic injuries on exam save for a mild abrasion to the forehead. A head CT was performed which did not show any acute traumatic injury. Blood work and urine was tested and did not show any concerning abnormalities. Patient does state that he has been falling and has a history of falls. At this point I doubt ACS given negative  troponin and unchanged EKG. I doubt PE as a cause of the fall or syncopal episode. Patient does have a known history of arrhythmias while I think this is possible patient denied any chest pain or palpitations. Discussed with patient and caretaker importance of following up with primary care doctor.  ____________________________________________   FINAL CLINICAL IMPRESSION(S) / ED DIAGNOSES  Final diagnoses:  Burna Mortimer, MD 06/06/15 [phone]

## 2015-06-06 NOTE — ED Notes (Signed)
Pt from group home, caregiver states pt fell this AM and hit his head and now he is confused, denies any use of blood thinners or LOC, caregiver states he was doing things like putting his shorts on his head, pt awake and caregiver states he is at his normal level of conscience

## 2015-06-06 NOTE — ED Notes (Addendum)
Patient states that he has had 3 falls today, patient states that he has passed out 3 times and would wake up 4 minutes afterwards. Patient has small abrasion above the left eye.   Patient reports increased falls in the last month since his medications have been changed.

## 2015-06-06 NOTE — Discharge Instructions (Signed)
Please seek medical attention for any high fevers, chest pain, shortness of breath, change in behavior, persistent vomiting, bloody stool or any other new or concerning symptoms. ° °Fall Prevention in the Home  °Falls can cause injuries and can affect people from all age groups. There are many simple things that you can do to make your home safe and to help prevent falls. °WHAT CAN I DO ON THE OUTSIDE OF MY HOME? °· Regularly repair the edges of walkways and driveways and fix any cracks. °· Remove high doorway thresholds. °· Trim any shrubbery on the main path into your home. °· Use bright outdoor lighting. °· Clear walkways of debris and clutter, including tools and rocks. °· Regularly check that handrails are securely fastened and in good repair. Both sides of any steps should have handrails. °· Install guardrails along the edges of any raised decks or porches. °· Have leaves, snow, and ice cleared regularly. °· Use sand or salt on walkways during winter months. °· In the garage, clean up any spills right away, including grease or oil spills. °WHAT CAN I DO IN THE BATHROOM? °· Use night lights. °· Install grab bars by the toilet and in the tub and shower. Do not use towel bars as grab bars. °· Use non-skid mats or decals on the floor of the tub or shower. °· If you need to sit down while you are in the shower, use a plastic, non-slip stool.. °· Keep the floor dry. Immediately clean up any water that spills on the floor. °· Remove soap buildup in the tub or shower on a regular basis. °· Attach bath mats securely with double-sided non-slip rug tape. °· Remove throw rugs and other tripping hazards from the floor. °WHAT CAN I DO IN THE BEDROOM? °· Use night lights. °· Make sure that a bedside light is easy to reach. °· Do not use oversized bedding that drapes onto the floor. °· Have a firm chair that has side arms to use for getting dressed. °· Remove throw rugs and other tripping hazards from the floor. °WHAT CAN I  DO IN THE KITCHEN?  °· Clean up any spills right away. °· Avoid walking on wet floors. °· Place frequently used items in easy-to-reach places. °· If you need to reach for something above you, use a sturdy step stool that has a grab bar. °· Keep electrical cables out of the way. °· Do not use floor polish or wax that makes floors slippery. If you have to use wax, make sure that it is non-skid floor wax. °· Remove throw rugs and other tripping hazards from the floor. °WHAT CAN I DO IN THE STAIRWAYS? °· Do not leave any items on the stairs. °· Make sure that there are handrails on both sides of the stairs. Fix handrails that are broken or loose. Make sure that handrails are as long as the stairways. °· Check any carpeting to make sure that it is firmly attached to the stairs. Fix any carpet that is loose or worn. °· Avoid having throw rugs at the top or bottom of stairways, or secure the rugs with carpet tape to prevent them from moving. °· Make sure that you have a light switch at the top of the stairs and the bottom of the stairs. If you do not have them, have them installed. °WHAT ARE SOME OTHER FALL PREVENTION TIPS? °· Wear closed-toe shoes that fit well and support your feet. Wear shoes that have rubber soles or low   heels. °· When you use a stepladder, make sure that it is completely opened and that the sides are firmly locked. Have someone hold the ladder while you are using it. Do not climb a closed stepladder. °· Add color or contrast paint or tape to grab bars and handrails in your home. Place contrasting color strips on the first and last steps. °· Use mobility aids as needed, such as canes, walkers, scooters, and crutches. °· Turn on lights if it is dark. Replace any light bulbs that burn out. °· Set up furniture so that there are clear paths. Keep the furniture in the same spot. °· Fix any uneven floor surfaces. °· Choose a carpet design that does not hide the edge of steps of a stairway. °· Be aware of any  and all pets. °· Review your medicines with your healthcare provider. Some medicines can cause dizziness or changes in blood pressure, which increase your risk of falling. °Talk with your health care provider about other ways that you can decrease your risk of falls. This may include working with a physical therapist or trainer to improve your strength, balance, and endurance. °  °This information is not intended to replace advice given to you by your health care provider. Make sure you discuss any questions you have with your health care provider. °  °Document Released: 04/10/2002 Document Revised: 09/04/2014 Document Reviewed: 05/25/2014 °Elsevier Interactive Patient Education ©2016 [employer] ° °

## 2015-06-06 NOTE — ED Notes (Signed)
Patient unable to urinate at this time; per Dr. Archie Balboa OK for patient to have something to drink. Patient give 8 oz of orange juice.

## 2015-08-19 ENCOUNTER — Emergency Department
Admission: EM | Admit: 2015-08-19 | Discharge: 2015-08-19 | Disposition: A | Discharge status: Home / Self Care | Payer: Medicare Other | Attending: Emergency Medicine | Admitting: Emergency Medicine

## 2015-08-19 ENCOUNTER — Emergency Department: Payer: Medicare Other

## 2015-08-19 DIAGNOSIS — G218 Other secondary parkinsonism: Secondary | ICD-10-CM | POA: Insufficient documentation

## 2015-08-19 DIAGNOSIS — K219 Gastro-esophageal reflux disease without esophagitis: Secondary | ICD-10-CM | POA: Diagnosis not present

## 2015-08-19 DIAGNOSIS — Z794 Long term (current) use of insulin: Secondary | ICD-10-CM | POA: Insufficient documentation

## 2015-08-19 DIAGNOSIS — F329 Major depressive disorder, single episode, unspecified: Secondary | ICD-10-CM | POA: Diagnosis not present

## 2015-08-19 DIAGNOSIS — S0101XA Laceration without foreign body of scalp, initial encounter: Secondary | ICD-10-CM | POA: Diagnosis not present

## 2015-08-19 DIAGNOSIS — W182XXA Fall in (into) shower or empty bathtub, initial encounter: Secondary | ICD-10-CM | POA: Diagnosis not present

## 2015-08-19 DIAGNOSIS — Z79899 Other long term (current) drug therapy: Secondary | ICD-10-CM | POA: Insufficient documentation

## 2015-08-19 DIAGNOSIS — I10 Essential (primary) hypertension: Secondary | ICD-10-CM | POA: Diagnosis not present

## 2015-08-19 DIAGNOSIS — E119 Type 2 diabetes mellitus without complications: Secondary | ICD-10-CM | POA: Insufficient documentation

## 2015-08-19 DIAGNOSIS — Y999 Unspecified external cause status: Secondary | ICD-10-CM | POA: Diagnosis not present

## 2015-08-19 DIAGNOSIS — Z7984 Long term (current) use of oral hypoglycemic drugs: Secondary | ICD-10-CM | POA: Insufficient documentation

## 2015-08-19 DIAGNOSIS — Y939 Activity, unspecified: Secondary | ICD-10-CM | POA: Insufficient documentation

## 2015-08-19 DIAGNOSIS — E7800 Pure hypercholesterolemia, unspecified: Secondary | ICD-10-CM | POA: Insufficient documentation

## 2015-08-19 DIAGNOSIS — Y9289 Other specified places as the place of occurrence of the external cause: Secondary | ICD-10-CM | POA: Diagnosis not present

## 2015-08-19 DIAGNOSIS — S0990XA Unspecified injury of head, initial encounter: Secondary | ICD-10-CM

## 2015-08-19 DIAGNOSIS — F209 Schizophrenia, unspecified: Secondary | ICD-10-CM | POA: Diagnosis not present

## 2015-08-19 NOTE — ED Provider Notes (Signed)
[facility] Emergency Department Provider Note  ____________________________________________    I have reviewed the triage vital signs and the nursing notes.   HISTORY  Chief Complaint Fall and Head Injury    HPI Lawrence Johnston is a 55 y.o. male who presents to the ED after a fall. Patient from [facility] care home. Patient reports he slipped in the shower. He denies chest pain, palpitations, dizziness. He feels well now. No extremity injuries. Complains of mild head pain. He is not on blood thinners.     Past Medical History  Diagnosis Date  . Hypercholesteremia   . Tremor   . Diabetes ([place])   . Schizophrenia ([place])   . Hypertension   . Depression   . GERD (gastroesophageal reflux disease)   . Sleep apnea   . Arrhythmia     Atrial Fibrillation; Atrial Flutter w/RVR; SVT    Patient Active Problem List   Diagnosis Date Noted  . 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 Schizophrenia ([place]) 06/19/2013    Past Surgical History  Procedure Laterality Date  . Cardioversion      x2, a-fib  . Colonoscopy with propofol N/A 02/25/2015    Procedure: COLONOSCOPY WITH PROPOFOL;  Surgeon: Josefine Class, MD;  Location: [facility] ENDOSCOPY;  Service: Endoscopy;  Laterality: N/A;  . Colonoscopy with propofol N/A 04/12/2015    Procedure: COLONOSCOPY WITH PROPOFOL;  Surgeon: Josefine Class, MD;  Location: [facility] ENDOSCOPY;  Service: Endoscopy;  Laterality: N/A;    Current Outpatient Rx  Name  Route  Sig  Dispense  Refill  . benztropine (COGENTIN) 1 MG tablet   Oral   Take 1 mg by mouth 2 (two) times daily.          . cloNIDine (CATAPRES) 0.1 MG tablet   Oral   Take 0.1 mg by mouth daily as needed (for shaking).         Marland Kitchen divalproex (DEPAKOTE ER) 500 MG 24 hr tablet   Oral   Take 2,000 mg by mouth at bedtime.         . docusate sodium (COLACE) 100 MG capsule   Oral   Take 100 mg by mouth daily.         Marland Kitchen FLUoxetine (PROZAC) 20 MG tablet   Oral   Take 20 mg by mouth daily.         . fluPHENAZine (PROLIXIN) 5 MG tablet   Oral   Take 5 mg by mouth 2 (two) times daily. In the evening and at bedtime         . furosemide (LASIX) 20 MG tablet   Oral   Take 20 mg by mouth daily.         Marland Kitchen gabapentin (NEURONTIN) 300 MG capsule   Oral   Take 300 mg by mouth at bedtime.          . insulin aspart (NOVOLOG) 100 UNIT/ML injection   Subcutaneous   Inject into the skin 2 (two) times daily as needed for high blood sugar. Sliding scale         . LORazepam (ATIVAN) 0.5 MG tablet   Oral   Take 0.5 mg by mouth every morning.          Marland Kitchen LORazepam (ATIVAN) 0.5 MG tablet   Oral   Take 1 mg by mouth at bedtime.         . metFORMIN (GLUCOPHAGE) 500 MG tablet  Oral   Take 500 mg by mouth 2 (two) times daily with a meal.         . metoprolol succinate (TOPROL-XL) 25 MG 24 hr tablet   Oral   Take 25 mg by mouth 2 (two) times daily.          . Multiple Vitamins-Iron (MULTIVITAMINS WITH IRON) TABS tablet   Oral   Take 1 tablet by mouth daily.         . pantoprazole (PROTONIX) 40 MG tablet   Oral   Take 40 mg by mouth daily.         . polyethylene glycol (MIRALAX / GLYCOLAX) packet   Oral   Take 17 g by mouth daily.         . QUEtiapine (SEROQUEL) 400 MG tablet   Oral   Take 400 mg by mouth at bedtime.         Marland Kitchen rOPINIRole (REQUIP) 2 MG tablet   Oral   Take 2 mg by mouth at bedtime.         . rosuvastatin (CRESTOR) 20 MG tablet   Oral   Take 20 mg by mouth at bedtime.            Allergies Review of patient's allergies indicates no known allergies.  No family history on file.  Social History Social History  Substance Use Topics  . Smoking status: Never Smoker   . Smokeless tobacco: Never Used  . Alcohol Use: No    Review of Systems  Constitutional: Negative for dizziness Eyes: Negative for  redness ENT: Negative for sore throat Cardiovascular: Negative for chest pain, no palpitations Respiratory: Negative for shortness of breath. Gastrointestinal: Negative for abdominal pain Genitourinary: Negative for dysuria. Musculoskeletal: Negative for back pain. No extremity pain Skin: Positive for laceration Neurological: Negative for focal weakness, mild headache Psychiatric: no anxiety    ____________________________________________   PHYSICAL EXAM:  VITAL SIGNS: ED Triage Vitals  Enc Vitals Group     BP 08/19/15 0815 127/86 mmHg     Pulse Rate 08/19/15 0815 70     Resp 08/19/15 0815 16     Temp 08/19/15 0815 98.2 F (36.8 C)     Temp Source 08/19/15 0815 Oral     SpO2 08/19/15 0815 100 %     Weight 08/19/15 0815 224 lb (101.606 kg)     Height 08/19/15 0815 5\' 9"  (1.753 m)     Head Cir --      Peak Flow --      Pain Score 08/19/15 0816 3     Pain Loc --      Pain Edu? --      Excl. in [place]? --      Constitutional: Alert and oriented. Well appearing and in no distress.  Eyes: Conjunctivae are normal. No erythema or injection ENT   Head: Normocephalic. Approximately 3 cm vertical laceration to the posterior scalp, bleeding controlled   Mouth/Throat: Mucous membranes are moist. Cardiovascular: Normal rate, regular rhythm. Normal and symmetric distal pulses are present in the upper extremities.  Respiratory: Normal respiratory effort without tachypnea nor retractions. Breath sounds are clear and equal bilaterally.  Gastrointestinal: Soft and non-tender in all quadrants. No distention. There is no CVA tenderness. Genitourinary: deferred Musculoskeletal: Nontender with normal range of motion in all extremities. No lower extremity tenderness nor edema. No pain with axial load of both hips. No vertebral tenderness to palpation. Neurologic:  Normal speech and language. No gross focal neurologic  deficits are appreciated. Skin:  Skin is warm, dry and intact. No rash  noted. Psychiatric: Mood and affect are normal. Patient exhibits appropriate insight and judgment.  ____________________________________________    LABS (pertinent positives/negatives)  Labs Reviewed - No data to display  ____________________________________________   EKG  None  ____________________________________________    RADIOLOGY  CT head and cervical spine show no acute distress.  ____________________________________________   PROCEDURES  Procedure(s) performed: yes  LACERATION REPAIR Performed by: Lavonia Drafts Authorized by: Lavonia Drafts Consent: Verbal consent obtained. Risks and benefits: risks, benefits and alternatives were discussed Consent given by: patient Patient identity confirmed: provided demographic data Prepped and Draped in normal sterile fashion Wound explored  Laceration Location:scalp3  Laceration Length: 3.5 cm  No Foreign Bodies seen or palpated  Irrigation method: syringe Amount of cleaning: standard  Skin closure: staples  Number of staples: 5   Patient tolerance: Patient tolerated the procedure well with no immediate complications.    Critical Care performed: none  ____________________________________________   INITIAL IMPRESSION / ASSESSMENT AND PLAN / ED COURSE  Pertinent labs & imaging results that were available during my care of the patient were reviewed by me and considered in my medical decision making (see chart for details).  Patient presents after a fall. He reports he was not wearing a shower shoes and slipped in the shower and fell backwards striking his head. No loss of consciousness. No neuro deficits. ____________________________________________   FINAL CLINICAL IMPRESSION(S) / ED DIAGNOSES  Final diagnoses:  Head injury, initial encounter  Scalp laceration, initial encounter          Lavonia Drafts, MD 08/19/15 1006

## 2015-08-19 NOTE — ED Notes (Signed)
Pt comes into the ED via EMS from [facility].. States he was not wearing his shower shoes this morning and slipped when he was leaving the shower hitting the back of his head,.. Pt has a 1in lac to the posterior head with controlled bleeding.. Pt denies any other pain or injury.. Pt denies N/V/or visual changes.Marland Kitchen

## 2015-08-19 NOTE — ED Notes (Signed)
Spoke with staff at [facility] [phone], states they will call for pt transportation back to the facility.. Also spoke with the pt mother/emergency contact and gave her any update on the pt status.

## 2015-08-19 NOTE — Discharge Instructions (Signed)
Staples need to be removed in approximately 5 days. This can be done at his PCP's office or in the ED if necessary   Stitches, Staples, or Adhesive Wound Closure Health care providers use stitches (sutures), staples, and certain glue (skin adhesives) to hold skin together while it heals (wound closure). You may need this treatment after you have surgery or if you cut your skin accidentally. These methods help your skin to heal more quickly and make it less likely that you will have a scar. A wound may take several months to heal completely. The type of wound you have determines when your wound gets closed. In most cases, the wound is closed as soon as possible (primary skin closure). Sometimes, closure is delayed so the wound can be cleaned and allowed to heal naturally. This reduces the chance of infection. Delayed closure may be needed if your wound:  Is caused by a bite.  Happened more than 6 hours ago.  Involves loss of skin or the tissues under the skin.  Has dirt or debris in it that cannot be removed.  Is infected. WHAT ARE THE DIFFERENT KINDS OF WOUND CLOSURES? There are many options for wound closure. The one that your health care provider uses depends on how deep and how large your wound is. Adhesive Glue To use this type of glue to close a wound, your health care provider holds the edges of the wound together and paints the glue on the surface of your skin. You may need more than one layer of glue. Then the wound may be covered with a light bandage (dressing). This type of skin closure may be used for small wounds that are not deep (superficial). Using glue for wound closure is less painful than other methods. It does not require a medicine that numbs the area (local anesthetic). This method also leaves nothing to be removed. Adhesive glue is often used for children and on facial wounds. Adhesive glue cannot be used for wounds that are deep, uneven, or bleeding. It is not used inside  of a wound.  Adhesive Strips These strips are made of sticky (adhesive), porous paper. They are applied across your skin edges like a regular adhesive bandage. You leave them on until they fall off. Adhesive strips may be used to close very superficial wounds. They may also be used along with sutures to improve the closure of your skin edges.  Sutures Sutures are the oldest method of wound closure. Sutures can be made from natural substances, such as silk, or from synthetic materials, such as nylon and steel. They can be made from a material that your body can break down as your wound heals (absorbable), or they can be made from a material that needs to be removed from your skin (nonabsorbable). They come in many different strengths and sizes. Your health care provider attaches the sutures to a steel needle on one end. Sutures can be passed through your skin, or through the tissues beneath your skin. Then they are tied and cut. Your skin edges may be closed in one continuous stitch or in separate stitches. Sutures are strong and can be used for all kinds of wounds. Absorbable sutures may be used to close tissues under the skin. The disadvantage of sutures is that they may cause skin reactions that lead to infection. Nonabsorbable sutures need to be removed. Staples When surgical staples are used to close a wound, the edges of your skin on both sides of the wound are  brought close together. A staple is placed across the wound, and an instrument secures the edges together. Staples are often used to close surgical cuts (incisions). Staples are faster to use than sutures, and they cause less skin reaction. Staples need to be removed using a tool that bends the staples away from your skin. HOW DO I CARE FOR MY WOUND CLOSURE?  Take medicines only as directed by your health care provider.  If you were prescribed an antibiotic medicine for your wound, finish it all even if you start to feel better.  Use  ointments or creams only as directed by your health care provider.  Wash your hands with soap and water before and after touching your wound.  Do not soak your wound in water. Do not take baths, swim, or use a hot tub until your health care provider approves.  Ask your health care provider when you can start showering. Cover your wound if directed by your health care provider.  Do not take out your own sutures or staples.  Do not pick at your wound. Picking can cause an infection.  Keep all follow-up visits as directed by your health care provider. This is important. HOW LONG WILL I HAVE MY WOUND CLOSURE?  Leave adhesive glue on your skin until the glue peels away.  Leave adhesive strips on your skin until the strips fall off.  Absorbable sutures will dissolve within several days.  Nonabsorbable sutures and staples must be removed. The location of the wound will determine how long they stay in. This can range from several days to a couple of weeks. WHEN SHOULD I SEEK HELP FOR MY WOUND CLOSURE? Contact your health care provider if:  You have a fever.  You have chills.  You have drainage, redness, swelling, or pain at your wound.  There is a bad smell coming from your wound.  The skin edges of your wound start to separate after your sutures have been removed.  Your wound becomes thick, raised, and darker in color after your sutures come out (scarring).   This information is not intended to replace advice given to you by your health care provider. Make sure you discuss any questions you have with your health care provider.   Document Released: 01/13/2001 Document Revised: 05/11/2014 Document Reviewed: 09/27/2013 Elsevier Interactive Patient Education [employer].

## 2016-03-11 ENCOUNTER — Inpatient Hospital Stay: Payer: Medicare Other

## 2016-03-11 ENCOUNTER — Inpatient Hospital Stay: Payer: Medicare Other | Attending: Oncology | Admitting: Oncology

## 2016-03-11 ENCOUNTER — Encounter: Payer: Self-pay | Admitting: Oncology

## 2016-03-11 VITALS — BP 118/68 | HR 77 | Temp 98.0°F | Resp 18 | Wt 227.1 lb

## 2016-03-11 DIAGNOSIS — E7800 Pure hypercholesterolemia, unspecified: Secondary | ICD-10-CM

## 2016-03-11 DIAGNOSIS — I10 Essential (primary) hypertension: Secondary | ICD-10-CM | POA: Insufficient documentation

## 2016-03-11 DIAGNOSIS — Z7984 Long term (current) use of oral hypoglycemic drugs: Secondary | ICD-10-CM | POA: Insufficient documentation

## 2016-03-11 DIAGNOSIS — I4891 Unspecified atrial fibrillation: Secondary | ICD-10-CM | POA: Diagnosis not present

## 2016-03-11 DIAGNOSIS — R251 Tremor, unspecified: Secondary | ICD-10-CM | POA: Insufficient documentation

## 2016-03-11 DIAGNOSIS — F329 Major depressive disorder, single episode, unspecified: Secondary | ICD-10-CM | POA: Insufficient documentation

## 2016-03-11 DIAGNOSIS — K219 Gastro-esophageal reflux disease without esophagitis: Secondary | ICD-10-CM | POA: Insufficient documentation

## 2016-03-11 DIAGNOSIS — Z79899 Other long term (current) drug therapy: Secondary | ICD-10-CM | POA: Insufficient documentation

## 2016-03-11 DIAGNOSIS — D473 Essential (hemorrhagic) thrombocythemia: Secondary | ICD-10-CM | POA: Insufficient documentation

## 2016-03-11 DIAGNOSIS — C9210 Chronic myeloid leukemia, BCR/ABL-positive, not having achieved remission: Secondary | ICD-10-CM | POA: Diagnosis not present

## 2016-03-11 DIAGNOSIS — D72829 Elevated white blood cell count, unspecified: Secondary | ICD-10-CM | POA: Diagnosis not present

## 2016-03-11 DIAGNOSIS — F259 Schizoaffective disorder, unspecified: Secondary | ICD-10-CM | POA: Diagnosis not present

## 2016-03-11 DIAGNOSIS — E119 Type 2 diabetes mellitus without complications: Secondary | ICD-10-CM | POA: Diagnosis not present

## 2016-03-11 DIAGNOSIS — D649 Anemia, unspecified: Secondary | ICD-10-CM | POA: Diagnosis not present

## 2016-03-11 DIAGNOSIS — D469 Myelodysplastic syndrome, unspecified: Secondary | ICD-10-CM | POA: Insufficient documentation

## 2016-03-11 DIAGNOSIS — G4730 Sleep apnea, unspecified: Secondary | ICD-10-CM | POA: Diagnosis not present

## 2016-03-11 LAB — CBC WITH DIFFERENTIAL/PLATELET
BLASTS: 4 %
Band Neutrophils: 11 %
Basophils Absolute: 9.6 10*3/uL — ABNORMAL HIGH (ref 0–0.1)
Basophils Relative: 8 %
Eosinophils Absolute: 2.4 10*3/uL — ABNORMAL HIGH (ref 0–0.7)
Eosinophils Relative: 2 %
HEMATOCRIT: 30 % — AB (ref 40.0–52.0)
HEMOGLOBIN: 9.7 g/dL — AB (ref 13.0–18.0)
LYMPHS PCT: 1 %
Lymphs Abs: 1.2 10*3/uL (ref 1.0–3.6)
MCH: 29.2 pg (ref 26.0–34.0)
MCHC: 32.1 g/dL (ref 32.0–36.0)
MCV: 91.1 fL (ref 80.0–100.0)
MONOS PCT: 0 %
Metamyelocytes Relative: 19 %
Monocytes Absolute: 0 10*3/uL — ABNORMAL LOW (ref 0.2–1.0)
Myelocytes: 22 %
NEUTROS ABS: 102.3 10*3/uL — AB (ref 1.4–6.5)
NEUTROS PCT: 31 %
NRBC: 0 /100{WBCs}
OTHER: 0 %
PROMYELOCYTES ABS: 2 %
Platelets: 802 10*3/uL — ABNORMAL HIGH (ref 150–440)
RBC: 3.3 MIL/uL — AB (ref 4.40–5.90)
RDW: 16.7 % — ABNORMAL HIGH (ref 11.5–14.5)
WBC: 120.4 10*3/uL (ref 3.8–10.6)

## 2016-03-11 LAB — LACTATE DEHYDROGENASE: LDH: 567 U/L — ABNORMAL HIGH (ref 98–192)

## 2016-03-11 LAB — PATHOLOGIST SMEAR REVIEW

## 2016-03-11 NOTE — Progress Notes (Signed)
[facility]  Telephone:(336) [phone] Fax:(336) [phone]  ID: Lawrence Johnston OB: 19-Aug-1960  MR#: [id number]  SWF#:[id number]  Patient Care Team: Idelle Crouch, MD as PCP - General (Internal Medicine)  CHIEF COMPLAINT: Leukocytosis  INTERVAL HISTORY: Patient is a 55 year old male with significant schizoaffective disorder who was found to have a significantly elevated white blood cell count on routine blood work. He currently feels well and is asymptomatic. He does not complain of fevers, night sweats, or weight loss. He has no neurologic complaints. He denies any chest pain or shortness of breath. He has no nausea, vomiting, constipation, or diarrhea. He has no urinary complaints. Patient's caretaker reports that he appears at his baseline. Patient offers no specific complaints today.  REVIEW OF SYSTEMS:   Review of Systems  Constitutional: Negative.  Negative for chills, diaphoresis, fever, malaise/fatigue and weight loss.  Respiratory: Negative.  Negative for cough and shortness of breath.   Cardiovascular: Negative.  Negative for chest pain and leg swelling.  Gastrointestinal: Negative.  Negative for abdominal pain.  Genitourinary: Negative.   Neurological: Negative.  Negative for sensory change and weakness.  Psychiatric/Behavioral: Negative.  The patient is not nervous/anxious.     As per HPI. Otherwise, a complete review of systems is negative.  PAST MEDICAL HISTORY: Past Medical History:  Diagnosis Date  . Arrhythmia    Atrial Fibrillation; Atrial Flutter w/RVR; SVT  . Depression   . Diabetes ([place])   . GERD (gastroesophageal reflux disease)   . Hypercholesteremia   . Hypertension   . Schizophrenia ([place])   . Sleep apnea   . Tremor     PAST SURGICAL HISTORY: Past Surgical History:  Procedure Laterality Date  . CARDIOVERSION     x2, a-fib  . COLONOSCOPY WITH PROPOFOL N/A 02/25/2015   Procedure: COLONOSCOPY WITH PROPOFOL;  Surgeon: Josefine Class, MD;  Location: [facility] ENDOSCOPY;  Service: Endoscopy;  Laterality: N/A;  . COLONOSCOPY WITH PROPOFOL N/A 04/12/2015   Procedure: COLONOSCOPY WITH PROPOFOL;  Surgeon: Josefine Class, MD;  Location[facility] ENDOSCOPY;  Service: Endoscopy;  Laterality: N/A;    FAMILY HISTORY: History reviewed. No pertinent family history.  ADVANCED DIRECTIVES (Y/N):  N  HEALTH MAINTENANCE: Social History  Substance Use Topics  . Smoking status: Never Smoker  . Smokeless tobacco: Never Used  . Alcohol use No     Colonoscopy:  PAP:  Bone density:  Lipid panel:  No Known Allergies  Current Outpatient Prescriptions  Medication Sig Dispense Refill  . benztropine (COGENTIN) 1 MG tablet Take 1 mg by mouth 2 (two) times daily.     . cloNIDine (CATAPRES) 0.1 MG tablet Take 0.1 mg by mouth daily as needed (for shaking).    Marland Kitchen divalproex (DEPAKOTE ER) 500 MG 24 hr tablet Take 2,000 mg by mouth at bedtime.    Marland Kitchen FLUoxetine (PROZAC) 20 MG tablet Take 20 mg by mouth daily.    . fluPHENAZine (PROLIXIN) 5 MG tablet Take 5 mg by mouth 2 (two) times daily. In the evening and at bedtime    . furosemide (LASIX) 20 MG tablet Take 20 mg by mouth daily.    Marland Kitchen gabapentin (NEURONTIN) 300 MG capsule Take 300 mg by mouth at bedtime.     Marland Kitchen LORazepam (ATIVAN) 0.5 MG tablet Take 0.5 mg by mouth 2 (two) times daily.    . metFORMIN (GLUCOPHAGE) 500 MG tablet Take 500 mg by mouth 2 (two) times daily with a meal.    . metoprolol succinate (TOPROL-XL) 25  MG 24 hr tablet Take 25 mg by mouth 2 (two) times daily.     . Multiple Vitamins-Iron (MULTIVITAMINS WITH IRON) TABS tablet Take 1 tablet by mouth daily.    . pantoprazole (PROTONIX) 40 MG tablet Take 40 mg by mouth daily.    . polyethylene glycol (MIRALAX / GLYCOLAX) packet Take 17 g by mouth daily.    . QUEtiapine (SEROQUEL) 400 MG tablet Take 400 mg by mouth at bedtime.    Marland Kitchen rOPINIRole (REQUIP) 2 MG tablet Take 2 mg by mouth at bedtime.    . rosuvastatin (CRESTOR) 20 MG  tablet Take 20 mg by mouth at bedtime.     Minus Liberty Tosylate (INGREZZA) 40 MG CAPS Take 2 capsules by mouth daily.     No current facility-administered medications for this visit.     OBJECTIVE: Vitals:   03/11/16 1404  BP: 118/68  Pulse: 77  Resp: 18  Temp: 98 F (36.7 C)     Body mass index is 33.53 kg/m.    ECOG FS:0 - Asymptomatic  General: Well-developed, well-nourished, no acute distress. Eyes: Pink conjunctiva, anicteric sclera. HEENT: Normocephalic, moist mucous membranes, clear oropharnyx. Lungs: Clear to auscultation bilaterally. Heart: Regular rate and rhythm. No rubs, murmurs, or gallops. Abdomen: Soft, nontender, nondistended. No organomegaly noted, normoactive bowel sounds. Musculoskeletal: No edema, cyanosis, or clubbing. Neuro: Alert, answering all questions appropriately. Cranial nerves grossly intact. Skin: No rashes or petechiae noted. Psych: Normal affect. Lymphatics: No cervical, calvicular, axillary or inguinal LAD.   LAB RESULTS:  Lab Results  Component Value Date   NA 141 06/06/2015   K 4.4 06/06/2015   CL 105 06/06/2015   CO2 29 06/06/2015   GLUCOSE 99 06/06/2015   BUN 19 06/06/2015   CREATININE 1.80 (H) 06/06/2015   CALCIUM 9.2 06/06/2015   PROT 7.2 06/06/2015   ALBUMIN 3.9 06/06/2015   AST 40 06/06/2015   ALT 27 06/06/2015   ALKPHOS 59 06/06/2015   BILITOT 0.6 06/06/2015   GFRNONAA 41 (L) 06/06/2015   GFRAA 48 (L) 06/06/2015    Lab Results  Component Value Date   WBC 120.4 (HH) 03/11/2016   NEUTROABS PENDING 03/11/2016   HGB 9.7 (L) 03/11/2016   HCT 30.0 (L) 03/11/2016   MCV 91.1 03/11/2016   PLT 802 (H) 03/11/2016     STUDIES: No results found.  ASSESSMENT: Leukocytosis, concerning for acute leukemia.  PLAN:    1. Leukocytosis: Patient's white blood cell count has significantly increased over the past week from 82 to 120. Pathology smear review on March 04, 2016 revealed marked leukocytosis with increased  neutrophils and basophils. Many immature granulocytes are present including occasional blasts. BCR-ABL and peripheral blood flow cytometry have been ordered and are pending at time of dictation. Plan on getting a stat bone marrow biopsy in the next 1-2 days. If confirmed acute leukemia, patient will be referred to [facility] for further evaluation and treatment. If biopsy not consistent with AML, patient will follow-up 1 week afterward to discuss the results and treatment planning 2. Anemia: Likely secondary to ongoing leukocytosis and possible leukemia. 3. Thrombocytosis: Likely reactive, monitor.  Approximately 45 minutes was spent in discussion of which greater than 50% was consultation.  Patient expressed understanding and was in agreement with this plan. He also understands that He can call clinic at any time with any questions, concerns, or complaints.    Lloyd Huger, MD   03/11/2016 4:23 PM

## 2016-03-11 NOTE — Progress Notes (Signed)
OPNA for myelodysplastic syndrome. Offers no complaints.

## 2016-03-16 ENCOUNTER — Encounter: Payer: Self-pay | Admitting: Oncology

## 2016-03-16 ENCOUNTER — Other Ambulatory Visit: Payer: Self-pay | Admitting: Radiology

## 2016-03-16 LAB — COMP PANEL: LEUKEMIA/LYMPHOMA

## 2016-03-17 ENCOUNTER — Ambulatory Visit
Admission: RE | Admit: 2016-03-17 | Discharge: 2016-03-17 | Disposition: A | Discharge status: Home / Self Care | Payer: Medicare Other | Source: Ambulatory Visit | Attending: Oncology | Admitting: Oncology

## 2016-03-17 DIAGNOSIS — F329 Major depressive disorder, single episode, unspecified: Secondary | ICD-10-CM | POA: Diagnosis not present

## 2016-03-17 DIAGNOSIS — C9210 Chronic myeloid leukemia, BCR/ABL-positive, not having achieved remission: Secondary | ICD-10-CM | POA: Insufficient documentation

## 2016-03-17 DIAGNOSIS — I10 Essential (primary) hypertension: Secondary | ICD-10-CM | POA: Diagnosis not present

## 2016-03-17 DIAGNOSIS — Z79899 Other long term (current) drug therapy: Secondary | ICD-10-CM | POA: Insufficient documentation

## 2016-03-17 DIAGNOSIS — I4891 Unspecified atrial fibrillation: Secondary | ICD-10-CM | POA: Diagnosis not present

## 2016-03-17 DIAGNOSIS — R251 Tremor, unspecified: Secondary | ICD-10-CM | POA: Insufficient documentation

## 2016-03-17 DIAGNOSIS — K219 Gastro-esophageal reflux disease without esophagitis: Secondary | ICD-10-CM | POA: Diagnosis not present

## 2016-03-17 DIAGNOSIS — Z7984 Long term (current) use of oral hypoglycemic drugs: Secondary | ICD-10-CM | POA: Diagnosis not present

## 2016-03-17 DIAGNOSIS — E7800 Pure hypercholesterolemia, unspecified: Secondary | ICD-10-CM | POA: Diagnosis not present

## 2016-03-17 DIAGNOSIS — G4730 Sleep apnea, unspecified: Secondary | ICD-10-CM | POA: Insufficient documentation

## 2016-03-17 DIAGNOSIS — D469 Myelodysplastic syndrome, unspecified: Secondary | ICD-10-CM | POA: Diagnosis present

## 2016-03-17 DIAGNOSIS — E119 Type 2 diabetes mellitus without complications: Secondary | ICD-10-CM | POA: Diagnosis not present

## 2016-03-17 DIAGNOSIS — F209 Schizophrenia, unspecified: Secondary | ICD-10-CM | POA: Insufficient documentation

## 2016-03-17 HISTORY — DX: Cardiac arrhythmia, unspecified: I49.9

## 2016-03-17 LAB — DIFFERENTIAL
BAND NEUTROPHILS: 25 %
BASOS ABS: 0 10*3/uL (ref 0–0.1)
Basophils Relative: 0 %
Blasts: 0 %
EOS ABS: 0 10*3/uL (ref 0–0.7)
Eosinophils Relative: 0 %
LYMPHS PCT: 2 %
Lymphs Abs: 2.4 10*3/uL (ref 1.0–3.6)
MONO ABS: 1.2 10*3/uL — AB (ref 0.2–1.0)
MONOS PCT: 1 %
Metamyelocytes Relative: 10 %
Myelocytes: 14 %
NEUTROS PCT: 42 %
Neutro Abs: 116 10*3/uL — ABNORMAL HIGH (ref 1.4–6.5)
OTHER: 0 %
PROMYELOCYTES ABS: 6 %
nRBC: 0 /100 WBC

## 2016-03-17 LAB — BCR-ABL1, CML/ALL, PCR, QUANT
E1A2 TRANSCRIPT: 0.124 %
b2a2 transcript: 14.109 %
b3a2 transcript: 334.532 %

## 2016-03-17 LAB — GLUCOSE, CAPILLARY: GLUCOSE-CAPILLARY: 101 mg/dL — AB (ref 65–99)

## 2016-03-17 LAB — PROTIME-INR
INR: 1.22
PROTHROMBIN TIME: 15.5 s — AB (ref 11.4–15.2)

## 2016-03-17 LAB — CBC
HCT: 31.5 % — ABNORMAL LOW (ref 40.0–52.0)
Hemoglobin: 9.9 g/dL — ABNORMAL LOW (ref 13.0–18.0)
MCH: 28.7 pg (ref 26.0–34.0)
MCHC: 31.4 g/dL — AB (ref 32.0–36.0)
MCV: 91.2 fL (ref 80.0–100.0)
PLATELETS: 664 10*3/uL — AB (ref 150–440)
RBC: 3.46 MIL/uL — ABNORMAL LOW (ref 4.40–5.90)
RDW: 17.2 % — AB (ref 11.5–14.5)
WBC: 119.6 10*3/uL — AB (ref 3.8–10.6)

## 2016-03-17 MED ORDER — SODIUM CHLORIDE 0.9 % IV SOLN
INTRAVENOUS | Status: DC
  Administered 2016-03-17: 10 mL/h via INTRAVENOUS

## 2016-03-17 MED ORDER — FENTANYL CITRATE (PF) 100 MCG/2ML IJ SOLN
INTRAMUSCULAR | Status: AC | PRN
  Administered 2016-03-17 (×2): 50 ug via INTRAVENOUS

## 2016-03-17 MED ORDER — MIDAZOLAM HCL 2 MG/2ML IJ SOLN
INTRAMUSCULAR | Status: AC | PRN
  Administered 2016-03-17 (×2): 1 mg via INTRAVENOUS

## 2016-03-17 MED ORDER — HYDROCODONE-ACETAMINOPHEN 5-325 MG PO TABS
1.0000 | ORAL_TABLET | ORAL | Status: DC | PRN
  Filled 2016-03-17: qty 2

## 2016-03-17 NOTE — Procedures (Signed)
Procedure and risks discussed with patient. Informed consent obtained. Will perform CT-guided bone marrow biopsy. 

## 2016-03-17 NOTE — Discharge Instructions (Signed)
Bone Marrow Aspiration and Bone Marrow Biopsy, Adult, Care After This sheet gives you information about how to care for yourself after your procedure. Your health care provider may also give you more specific instructions. If you have problems or questions, contact your health care provider. What can I expect after the procedure? After the procedure, it is common to have:  Mild pain and tenderness.  Swelling.  Bruising. Follow these instructions at home:  Take over-the-counter or prescription medicines only as told by your health care provider.  Do not take baths, swim, or use a hot tub until your health care provider approves. Ask if you can take a shower or have a sponge bath.  Follow instructions from your health care provider about how to take care of the puncture site. Make sure you:  Wash your hands with soap and water before you change your bandage (dressing). If soap and water are not available, use hand sanitizer.  Change your dressing as told by your health care provider.  Check your puncture siteevery day for signs of infection. Check for:  More redness, swelling, or pain.  More fluid or blood.  Warmth.  Pus or a bad smell.  Return to your normal activities as told by your health care provider. Ask your health care provider what activities are safe for you.  Do not drive for 24 hours if you were given a medicine to help you relax (sedative).  Keep all follow-up visits as told by your health care provider. This is important. Contact a health care provider if:  You have more redness, swelling, or pain around the puncture site.  You have more fluid or blood coming from the puncture site.  Your puncture site feels warm to the touch.  You have pus or a bad smell coming from the puncture site.  You have a fever.  Your pain is not controlled with medicine. This information is not intended to replace advice given to you by your health care provider. Make sure you  discuss any questions you have with your health care provider. Document Released: 11/07/2004 Document Revised: 11/08/2015 Document Reviewed: 10/02/2015 Elsevier Interactive Patient Education  2017 [employer].

## 2016-03-17 NOTE — Procedures (Signed)
Under CT guidance, bone marrow aspiration and core biopsy of left iliac bone was performed. No immediate complications.

## 2016-03-22 DIAGNOSIS — C9210 Chronic myeloid leukemia, BCR/ABL-positive, not having achieved remission: Secondary | ICD-10-CM | POA: Insufficient documentation

## 2016-03-22 NOTE — Progress Notes (Signed)
[facility]  Telephone:(336) [phone] Fax:(336) [phone]  ID: Jackelyn Hoehn OB: 1960/11/02  MR#: [id number]  INO#:[id number]  Patient Care Team: Idelle Crouch, MD as PCP - General (Internal Medicine)  CHIEF COMPLAINT: CML  INTERVAL HISTORY: Patient returns to clinic today to discuss his bone marrow biopsy results and treatment planning. He continues to feel well and remains asymptomatic. He does not complain of fevers, night sweats, or weight loss. He has no neurologic complaints. He denies any chest pain or shortness of breath. He has no nausea, vomiting, constipation, or diarrhea. He has no urinary complaints. Patient offers no specific complaints today.  REVIEW OF SYSTEMS:   Review of Systems  Constitutional: Negative.  Negative for chills, diaphoresis, fever, malaise/fatigue and weight loss.  Respiratory: Negative.  Negative for cough and shortness of breath.   Cardiovascular: Negative.  Negative for chest pain and leg swelling.  Gastrointestinal: Negative.  Negative for abdominal pain.  Genitourinary: Negative.   Neurological: Negative.  Negative for sensory change and weakness.  Psychiatric/Behavioral: Negative.  The patient is not nervous/anxious.     As per HPI. Otherwise, a complete review of systems is negative.  PAST MEDICAL HISTORY: Past Medical History:  Diagnosis Date  . Arrhythmia    Atrial Fibrillation; Atrial Flutter w/RVR; SVT  . Depression   . Diabetes ([place])   . Dysrhythmia   . GERD (gastroesophageal reflux disease)   . Hypercholesteremia   . Hypertension   . Schizophrenia ([place])   . Sleep apnea   . Tremor     PAST SURGICAL HISTORY: Past Surgical History:  Procedure Laterality Date  . CARDIOVERSION     x2, a-fib  . COLONOSCOPY WITH PROPOFOL N/A 02/25/2015   Procedure: COLONOSCOPY WITH PROPOFOL;  Surgeon: Josefine Class, MD;  Location: [facility] ENDOSCOPY;  Service: Endoscopy;  Laterality: N/A;  . COLONOSCOPY WITH PROPOFOL N/A  04/12/2015   Procedure: COLONOSCOPY WITH PROPOFOL;  Surgeon: Josefine Class, MD;  Location: [facility] ENDOSCOPY;  Service: Endoscopy;  Laterality: N/A;    FAMILY HISTORY: Reviewed and unchanged. No reported history of malignancy or chronic disease.  ADVANCED DIRECTIVES (Y/N):  N  HEALTH MAINTENANCE: Social History  Substance Use Topics  . Smoking status: Never Smoker  . Smokeless tobacco: Never Used  . Alcohol use No     Colonoscopy:  PAP:  Bone density:  Lipid panel:  No Known Allergies  Current Outpatient Prescriptions  Medication Sig Dispense Refill  . benztropine (COGENTIN) 1 MG tablet Take 1 mg by mouth 2 (two) times daily.     . cloNIDine (CATAPRES) 0.1 MG tablet Take 0.1 mg by mouth daily as needed (for shaking).    Marland Kitchen divalproex (DEPAKOTE ER) 500 MG 24 hr tablet Take 2,000 mg by mouth at bedtime.    Marland Kitchen FLUoxetine (PROZAC) 20 MG tablet Take 20 mg by mouth daily.    . fluPHENAZine (PROLIXIN) 5 MG tablet Take 5 mg by mouth 2 (two) times daily. In the evening and at bedtime    . furosemide (LASIX) 20 MG tablet Take 20 mg by mouth daily.    Marland Kitchen gabapentin (NEURONTIN) 300 MG capsule Take 300 mg by mouth at bedtime.     Marland Kitchen LORazepam (ATIVAN) 0.5 MG tablet Take 0.5 mg by mouth 2 (two) times daily.    . metFORMIN (GLUCOPHAGE) 500 MG tablet Take 500 mg by mouth 2 (two) times daily with a meal.    . metoprolol succinate (TOPROL-XL) 25 MG 24 hr tablet Take 25 mg by mouth  2 (two) times daily.     . Multiple Vitamins-Iron (MULTIVITAMINS WITH IRON) TABS tablet Take 1 tablet by mouth daily.    . pantoprazole (PROTONIX) 40 MG tablet Take 40 mg by mouth daily.    . polyethylene glycol (MIRALAX / GLYCOLAX) packet Take 17 g by mouth daily.    . QUEtiapine (SEROQUEL) 400 MG tablet Take 300 mg by mouth at bedtime.     Marland Kitchen rOPINIRole (REQUIP) 2 MG tablet Take 2 mg by mouth at bedtime.    . rosuvastatin (CRESTOR) 20 MG tablet Take 20 mg by mouth at bedtime.     Minus Liberty Tosylate (INGREZZA) 40  MG CAPS Take 2 capsules by mouth daily.     No current facility-administered medications for this visit.     OBJECTIVE: Vitals:   03/24/16 1104  BP: 119/67  Pulse: 76  Resp: 18  Temp: 97.2 F (36.2 C)     Body mass index is 31.7 kg/m.    ECOG FS:0 - Asymptomatic  General: Well-developed, well-nourished, no acute distress. Eyes: Pink conjunctiva, anicteric sclera. Lungs: Clear to auscultation bilaterally. Heart: Regular rate and rhythm. No rubs, murmurs, or gallops. Abdomen: Soft, nontender, nondistended. No organomegaly noted, normoactive bowel sounds. Musculoskeletal: No edema, cyanosis, or clubbing. Neuro: Alert, answering all questions appropriately. Cranial nerves grossly intact. Skin: No rashes or petechiae noted. Psych: Normal affect.  LAB RESULTS:  Lab Results  Component Value Date   NA 141 06/06/2015   K 4.4 06/06/2015   CL 105 06/06/2015   CO2 29 06/06/2015   GLUCOSE 99 06/06/2015   BUN 19 06/06/2015   CREATININE 1.80 (H) 06/06/2015   CALCIUM 9.2 06/06/2015   PROT 7.2 06/06/2015   ALBUMIN 3.9 06/06/2015   AST 40 06/06/2015   ALT 27 06/06/2015   ALKPHOS 59 06/06/2015   BILITOT 0.6 06/06/2015   GFRNONAA 41 (L) 06/06/2015   GFRAA 48 (L) 06/06/2015    Lab Results  Component Value Date   WBC 119.6 (HH) 04/01/16   NEUTROABS 116.0 (H) 2016/04/01   HGB 9.9 (L) 04/01/16   HCT 31.5 (L) 04-01-2016   MCV 91.2 Apr 01, 2016   PLT 664 (H) April 01, 2016     STUDIES: Ct Biopsy  Result Date: 01-Apr-2016 INDICATION: Elevated white blood cell count. EXAM: CT BIOPSY MEDICATIONS: None. ANESTHESIA/SEDATION: Moderate (conscious) sedation was employed during this procedure. A total of Versed 2.0 mg and Fentanyl 100 mcg was administered intravenously. Moderate Sedation Time: 15 minutes. The patient's level of consciousness and vital signs were monitored continuously by radiology nursing throughout the procedure under my direct supervision. COMPLICATIONS: None immediate.  PROCEDURE: Informed written consent was obtained from the patient after a thorough discussion of the procedural risks, benefits and alternatives. All questions were addressed. Sterile Barrier Technique was utilized including mask, sterile gloves, sterile drape, hand hygiene and skin antiseptic. A timeout was performed prior to the initiation of the procedure. Patient was placed prone on CT scanner. Posterior flank region was prepped and draped using sterile technique. Local anesthetic was applied. Under CT guidance, 22 gauge spinal needle was directed toward posterior margin of left iliac bone. Lidocaine was administered subperiosteally. Needle was removed. Then, also under CT guidance, trocar was directed through posterior margin of left iliac bone. Bone marrow aspirates were obtained with and without heparin. Then, bone marrow core biopsy was obtained. Core biopsy and aspirates were given to pathology technologist who was present at that time. Needle was removed and appropriate dressing was applied. IMPRESSION: Under CT guidance, successful  bone marrow aspiration and core biopsy of left iliac bone was performed. Electronically Signed   By: Marijo Conception, M.D.   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 Partial results are still pending awaiting underlying point mutation which possibly may direct therapy. While awaiting results, will initiate patient on Tasigna 200 mg every 12 hours. Patient was also given a prescription for allopurinol 300 mg daily to prevent any possible tumor lysis. Patient will return to clinic in 6 weeks which will be approximately one month after initiating his treatment for repeat laboratory work, EKG, and further evaluation.   2. Anemia: Likely secondary to ongoing leukocytosis and possible leukemia. 3. Thrombocytosis: Likely reactive, monitor.  Approximately 30 minutes was spent in discussion of which greater than 50% was  consultation.  Patient expressed understanding and was in agreement with this plan. He also understands that He can call clinic at any time with any questions, concerns, or complaints.    Lloyd Huger, MD   03/24/2016 1:47 PM

## 2016-03-24 ENCOUNTER — Other Ambulatory Visit: Payer: Self-pay | Admitting: *Deleted

## 2016-03-24 ENCOUNTER — Inpatient Hospital Stay (HOSPITAL_BASED_OUTPATIENT_CLINIC_OR_DEPARTMENT_OTHER): Payer: Medicare Other | Admitting: Oncology

## 2016-03-24 DIAGNOSIS — D473 Essential (hemorrhagic) thrombocythemia: Secondary | ICD-10-CM

## 2016-03-24 DIAGNOSIS — F259 Schizoaffective disorder, unspecified: Secondary | ICD-10-CM

## 2016-03-24 DIAGNOSIS — G4730 Sleep apnea, unspecified: Secondary | ICD-10-CM

## 2016-03-24 DIAGNOSIS — C9210 Chronic myeloid leukemia, BCR/ABL-positive, not having achieved remission: Secondary | ICD-10-CM

## 2016-03-24 DIAGNOSIS — Z79899 Other long term (current) drug therapy: Secondary | ICD-10-CM

## 2016-03-24 DIAGNOSIS — E119 Type 2 diabetes mellitus without complications: Secondary | ICD-10-CM

## 2016-03-24 DIAGNOSIS — F329 Major depressive disorder, single episode, unspecified: Secondary | ICD-10-CM

## 2016-03-24 DIAGNOSIS — D649 Anemia, unspecified: Secondary | ICD-10-CM

## 2016-03-24 DIAGNOSIS — R251 Tremor, unspecified: Secondary | ICD-10-CM

## 2016-03-24 DIAGNOSIS — D72829 Elevated white blood cell count, unspecified: Secondary | ICD-10-CM | POA: Diagnosis not present

## 2016-03-24 DIAGNOSIS — I4891 Unspecified atrial fibrillation: Secondary | ICD-10-CM

## 2016-03-24 DIAGNOSIS — Z7984 Long term (current) use of oral hypoglycemic drugs: Secondary | ICD-10-CM

## 2016-03-24 DIAGNOSIS — I10 Essential (primary) hypertension: Secondary | ICD-10-CM

## 2016-03-24 DIAGNOSIS — E7800 Pure hypercholesterolemia, unspecified: Secondary | ICD-10-CM

## 2016-03-24 DIAGNOSIS — K219 Gastro-esophageal reflux disease without esophagitis: Secondary | ICD-10-CM

## 2016-03-24 MED ORDER — NILOTINIB HCL 200 MG PO CAPS: 400.0000 mg | ORAL_CAPSULE | Freq: Two times a day (BID) | ORAL | 5 refills | Status: DC

## 2016-03-24 MED ORDER — ALLOPURINOL 300 MG PO TABS: 300.0000 mg | ORAL_TABLET | Freq: Every day | ORAL | 2 refills | Status: DC

## 2016-03-24 NOTE — Progress Notes (Signed)
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 Pt was educated on side effects, how to take, and when to call MD. Pt verbalized understanding and informed consent has been obtained.

## 2016-03-25 ENCOUNTER — Telehealth: Payer: Self-pay | Admitting: *Deleted

## 2016-03-25 NOTE — Telephone Encounter (Signed)
Prescription is being transferred to [place]

## 2016-05-06 NOTE — Progress Notes (Signed)
[place]  [phone]) [phone] Fax:(336) [phone]  ID: Lawrence Johnston OB: 08/10/1960  MR#: [id number]  BJS#:[id number]  Patient Care Team: Idelle Crouch, MD as PCP - General (Internal Medicine)  CHIEF COMPLAINT: CML  INTERVAL HISTORY: Patient returns to clinic today for evaluation and follow-up of oral chemo nilotinib.  He continues to feel well and remains asymptomatic. He does not complain of fevers, night sweats, or weight loss. He complains of new onset depression for past 1 month.  He saw his psychiatrist yesterday, but did not mention this to him as he did not want his mother to know or worry.  He complains of chronic bilateral leg swelling.  He denies any chest pain or shortness of breath. He has no nausea, vomiting, constipation, or diarrhea. He has no urinary complaints. Patient offers no other specific complaints today.  REVIEW OF SYSTEMS:   Review of Systems  Constitutional: Negative.  Negative for chills, diaphoresis, fever, malaise/fatigue and weight loss.  HENT: Negative for congestion.   Respiratory: Negative.  Negative for cough and shortness of breath.   Cardiovascular: Positive for leg swelling. Negative for chest pain.  Gastrointestinal: Negative.  Negative for abdominal pain.  Genitourinary: Negative.   Neurological: Negative.  Negative for sensory change and weakness.  Psychiatric/Behavioral: Positive for depression. The patient is not nervous/anxious.     As per HPI. Otherwise, a complete review of systems is negative.  PAST MEDICAL HISTORY: Past Medical History:  Diagnosis Date  . Arrhythmia    Atrial Fibrillation; Atrial Flutter w/RVR; SVT  . Depression   . Diabetes ([place])   . Dysrhythmia   . GERD (gastroesophageal reflux disease)   . Hypercholesteremia   . Hypertension   . Schizophrenia ([place])   . Sleep apnea   . Tremor     PAST SURGICAL HISTORY: Past Surgical History:  Procedure Laterality Date  . CARDIOVERSION     x2,  a-fib  . COLONOSCOPY WITH PROPOFOL N/A 02/25/2015   Procedure: COLONOSCOPY WITH PROPOFOL;  Surgeon: Josefine Class, MD;  Location: [facility] ENDOSCOPY;  Service: Endoscopy;  Laterality: N/A;  . COLONOSCOPY WITH PROPOFOL N/A 04/12/2015   Procedure: COLONOSCOPY WITH PROPOFOL;  Surgeon: Josefine Class, MD;  Location: [facility] ENDOSCOPY;  Service: Endoscopy;  Laterality: N/A;    FAMILY HISTORY: Reviewed and unchanged. No reported history of malignancy or chronic disease.  ADVANCED DIRECTIVES (Y/N):  N  HEALTH MAINTENANCE: Social History  Substance Use Topics  . Smoking status: Never Smoker  . Smokeless tobacco: Never Used  . Alcohol use No     Colonoscopy:  PAP:  Bone density:  Lipid panel:  No Known Allergies  Current Outpatient Prescriptions  Medication Sig Dispense Refill  . allopurinol (ZYLOPRIM) 300 MG tablet Take 1 tablet (300 mg total) by mouth daily. Start treatment 1 week prior to initiating Tasigna. 30 tablet 2  . benztropine (COGENTIN) 1 MG tablet Take 1 mg by mouth 2 (two) times daily.     . cloNIDine (CATAPRES) 0.1 MG tablet Take 0.1 mg by mouth daily as needed (for shaking).    Marland Kitchen divalproex (DEPAKOTE ER) 500 MG 24 hr tablet Take 2,000 mg by mouth at bedtime.    Marland Kitchen FLUoxetine (PROZAC) 20 MG tablet Take 20 mg by mouth daily.    . fluPHENAZine (PROLIXIN) 5 MG tablet Take 5 mg by mouth 2 (two) times daily. In the evening and at bedtime    . furosemide (LASIX) 20 MG tablet Take 20 mg by mouth daily.    Marland Kitchen  gabapentin (NEURONTIN) 300 MG capsule Take 300 mg by mouth at bedtime.     Marland Kitchen LORazepam (ATIVAN) 0.5 MG tablet Take 0.5 mg by mouth 2 (two) times daily.    . metFORMIN (GLUCOPHAGE) 500 MG tablet Take 500 mg by mouth 2 (two) times daily with a meal.    . metoprolol succinate (TOPROL-XL) 25 MG 24 hr tablet Take 25 mg by mouth 2 (two) times daily.     . Multiple Vitamins-Iron (MULTIVITAMINS WITH IRON) TABS tablet Take 1 tablet by mouth daily.    . nilotinib (TASIGNA) 200 MG  capsule Take 2 capsules (400 mg total) by mouth every 12 (twelve) hours. Give on an empty stomach 1 hour before or 2 hours after meals. 120 capsule 5  . pantoprazole (PROTONIX) 40 MG tablet Take 40 mg by mouth daily.    . polyethylene glycol (MIRALAX / GLYCOLAX) packet Take 17 g by mouth daily.    . QUEtiapine (SEROQUEL) 400 MG tablet Take 300 mg by mouth at bedtime.     Marland Kitchen rOPINIRole (REQUIP) 2 MG tablet Take 2 mg by mouth at bedtime.    . rosuvastatin (CRESTOR) 20 MG tablet Take 20 mg by mouth at bedtime.     Minus Liberty Tosylate (INGREZZA) 40 MG CAPS Take 2 capsules by mouth daily.     No current facility-administered medications for this visit.     OBJECTIVE: Vitals:   05/07/16 0957  BP: 129/80  Pulse: 76  Temp: 97.7 F (36.5 C)     Body mass index is 33.62 kg/m.    ECOG FS:0 - Asymptomatic  General: Well-developed, well-nourished, no acute distress. Eyes: Pink conjunctiva, anicteric sclera. Lungs: Clear to auscultation bilaterally. Heart: Regular rate and rhythm. No rubs, murmurs, or gallops. Abdomen: Soft, nontender, nondistended. No organomegaly noted, normoactive bowel sounds. Musculoskeletal: No edema, cyanosis, or clubbing. Neuro: Alert, answering all questions appropriately. Cranial nerves grossly intact. Skin: No rashes or petechiae noted. Psych: Normal affect.  LAB RESULTS:  Lab Results  Component Value Date   NA 135 05/07/2016   K 3.7 05/07/2016   CL 109 05/07/2016   CO2 23 05/07/2016   GLUCOSE 146 (H) 05/07/2016   BUN 10 05/07/2016   CREATININE 1.21 05/07/2016   CALCIUM 7.9 (L) 05/07/2016   PROT 7.1 05/07/2016   ALBUMIN 3.0 (L) 05/07/2016   AST 23 05/07/2016   ALT 15 (L) 05/07/2016   ALKPHOS 111 05/07/2016   BILITOT 0.8 05/07/2016   GFRNONAA >60 05/07/2016   GFRAA >60 05/07/2016    Lab Results  Component Value Date   WBC 2.9 (L) 05/07/2016   NEUTROABS 1.8 05/07/2016   HGB 8.8 (L) 05/07/2016   HCT 27.0 (L) 05/07/2016   MCV 91.9 05/07/2016    PLT 115 (L) 05/07/2016     STUDIES: No results found.  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 Continue Tasigna 200 mg every 12 hours. Okay to discontinue allopurinol. Patient is slightly pancytopenic, but will continue to monitor and may have to dose reduce Tasigna. Return to clinic in 6 weeks for laboratory work and then in 3 months for laboratory work and further evaluation.  2. Anemia: Possibly secondary to treatment, monitor. 3. Thrombocytopenia: Likely secondary to Tasigna, repeat laboratory work as above. 4. Leukopenia: Discontinue allopurinol. Monitor.  5. Depression: Discuss with psychiatrist and request confidentiality.   Patient expressed understanding and was in agreement with this plan. He also understands that He can call clinic at any time  with any questions, concerns, or complaints.    Lucendia Herrlich, NP  05/07/2016  11:13 AM  Patient was seen and evaluated independently and I agree with the assessment and plan as dictated above.  Lloyd Huger, MD 05/08/16 9:03 AM

## 2016-05-07 ENCOUNTER — Ambulatory Visit
Admission: RE | Admit: 2016-05-07 | Discharge: 2016-05-07 | Disposition: A | Discharge status: Home / Self Care | Payer: Medicare Other | Source: Ambulatory Visit | Attending: Oncology | Admitting: Oncology

## 2016-05-07 ENCOUNTER — Other Ambulatory Visit: Payer: Self-pay

## 2016-05-07 ENCOUNTER — Inpatient Hospital Stay: Payer: Medicare Other

## 2016-05-07 ENCOUNTER — Inpatient Hospital Stay: Payer: Medicare Other | Attending: Oncology | Admitting: Oncology

## 2016-05-07 VITALS — BP 129/80 | HR 76 | Temp 97.7°F | Wt 241.1 lb

## 2016-05-07 DIAGNOSIS — E7800 Pure hypercholesterolemia, unspecified: Secondary | ICD-10-CM | POA: Diagnosis not present

## 2016-05-07 DIAGNOSIS — Z79899 Other long term (current) drug therapy: Secondary | ICD-10-CM | POA: Diagnosis not present

## 2016-05-07 DIAGNOSIS — C9210 Chronic myeloid leukemia, BCR/ABL-positive, not having achieved remission: Secondary | ICD-10-CM | POA: Insufficient documentation

## 2016-05-07 DIAGNOSIS — R251 Tremor, unspecified: Secondary | ICD-10-CM | POA: Diagnosis not present

## 2016-05-07 DIAGNOSIS — K219 Gastro-esophageal reflux disease without esophagitis: Secondary | ICD-10-CM | POA: Diagnosis not present

## 2016-05-07 DIAGNOSIS — E119 Type 2 diabetes mellitus without complications: Secondary | ICD-10-CM | POA: Diagnosis not present

## 2016-05-07 DIAGNOSIS — I10 Essential (primary) hypertension: Secondary | ICD-10-CM | POA: Diagnosis not present

## 2016-05-07 DIAGNOSIS — D61818 Other pancytopenia: Secondary | ICD-10-CM | POA: Diagnosis not present

## 2016-05-07 DIAGNOSIS — F209 Schizophrenia, unspecified: Secondary | ICD-10-CM

## 2016-05-07 DIAGNOSIS — I4891 Unspecified atrial fibrillation: Secondary | ICD-10-CM | POA: Diagnosis not present

## 2016-05-07 DIAGNOSIS — Z7984 Long term (current) use of oral hypoglycemic drugs: Secondary | ICD-10-CM | POA: Insufficient documentation

## 2016-05-07 DIAGNOSIS — G4730 Sleep apnea, unspecified: Secondary | ICD-10-CM

## 2016-05-07 DIAGNOSIS — F329 Major depressive disorder, single episode, unspecified: Secondary | ICD-10-CM | POA: Diagnosis not present

## 2016-05-07 DIAGNOSIS — M7989 Other specified soft tissue disorders: Secondary | ICD-10-CM | POA: Diagnosis not present

## 2016-05-07 LAB — CBC WITH DIFFERENTIAL/PLATELET
BASOS ABS: 0.1 10*3/uL (ref 0–0.1)
BASOS PCT: 4 %
EOS ABS: 0.2 10*3/uL (ref 0–0.7)
EOS PCT: 6 %
HCT: 27 % — ABNORMAL LOW (ref 40.0–52.0)
Hemoglobin: 8.8 g/dL — ABNORMAL LOW (ref 13.0–18.0)
Lymphocytes Relative: 19 %
Lymphs Abs: 0.6 10*3/uL — ABNORMAL LOW (ref 1.0–3.6)
MCH: 30 pg (ref 26.0–34.0)
MCHC: 32.6 g/dL (ref 32.0–36.0)
MCV: 91.9 fL (ref 80.0–100.0)
Monocytes Absolute: 0.3 10*3/uL (ref 0.2–1.0)
Monocytes Relative: 9 %
Neutro Abs: 1.8 10*3/uL (ref 1.4–6.5)
Neutrophils Relative %: 62 %
PLATELETS: 115 10*3/uL — AB (ref 150–440)
RBC: 2.94 MIL/uL — AB (ref 4.40–5.90)
RDW: 16.9 % — AB (ref 11.5–14.5)
WBC: 2.9 10*3/uL — ABNORMAL LOW (ref 3.8–10.6)

## 2016-05-07 LAB — COMPREHENSIVE METABOLIC PANEL
ALT: 15 U/L — AB (ref 17–63)
AST: 23 U/L (ref 15–41)
Albumin: 3 g/dL — ABNORMAL LOW (ref 3.5–5.0)
Alkaline Phosphatase: 111 U/L (ref 38–126)
Anion gap: 3 — ABNORMAL LOW (ref 5–15)
BUN: 10 mg/dL (ref 6–20)
CHLORIDE: 109 mmol/L (ref 101–111)
CO2: 23 mmol/L (ref 22–32)
CREATININE: 1.21 mg/dL (ref 0.61–1.24)
Calcium: 7.9 mg/dL — ABNORMAL LOW (ref 8.9–10.3)
GFR calc Af Amer: >60 mL/min (ref >60)
Glucose, Bld: 146 mg/dL — ABNORMAL HIGH (ref 65–99)
POTASSIUM: 3.7 mmol/L (ref 3.5–5.1)
SODIUM: 135 mmol/L (ref 135–145)
Total Bilirubin: 0.8 mg/dL (ref 0.3–1.2)
Total Protein: 7.1 g/dL (ref 6.5–8.1)

## 2016-05-07 LAB — MAGNESIUM: MAGNESIUM: 2.1 mg/dL (ref 1.7–2.4)

## 2016-05-07 LAB — PHOSPHORUS: PHOSPHORUS: 3.1 mg/dL (ref 2.5–4.6)

## 2016-05-07 NOTE — Progress Notes (Signed)
Patient here for follow up. No changes since last appointment.  

## 2016-05-08 MED ORDER — HEPARIN SOD (PORK) LOCK FLUSH 100 UNIT/ML IV SOLN
INTRAVENOUS | Status: AC
  Filled 2016-05-08: qty 5

## 2016-06-01 ENCOUNTER — Other Ambulatory Visit: Payer: Self-pay | Admitting: Oncology

## 2016-06-08 ENCOUNTER — Telehealth: Payer: Self-pay | Admitting: *Deleted

## 2016-06-08 NOTE — Telephone Encounter (Signed)
Prescription for Tasigna was filled at [facility] and delivered to pt's group home. Pt will continue to get Tasigna filled at [facility].

## 2016-06-18 ENCOUNTER — Inpatient Hospital Stay: Payer: Medicare Other | Attending: Oncology

## 2016-08-05 ENCOUNTER — Ambulatory Visit: Payer: Medicare Other | Admitting: Oncology

## 2016-08-05 ENCOUNTER — Other Ambulatory Visit: Payer: Medicare Other

## 2016-08-05 NOTE — Progress Notes (Deleted)
[place]  Telephone:(336) [phone] Fax:(336) [phone]  ID: Lawrence Johnston OB: 1961-02-27  MR#: [id number]  QJJ#:[id number]  Patient Care Team: Idelle Crouch, MD as PCP - General (Internal Medicine)  CHIEF COMPLAINT: CML  INTERVAL HISTORY: Patient returns to clinic today for evaluation and follow-up of oral chemo nilotinib.  He continues to feel well and remains asymptomatic. He does not complain of fevers, night sweats, or weight loss. He complains of new onset depression for past 1 month.  He saw his psychiatrist yesterday, but did not mention this to him as he did not want his mother to know or worry.  He complains of chronic bilateral leg swelling.  He denies any chest pain or shortness of breath. He has no nausea, vomiting, constipation, or diarrhea. He has no urinary complaints. Patient offers no other specific complaints today.  REVIEW OF SYSTEMS:   Review of Systems  Constitutional: Negative.  Negative for chills, diaphoresis, fever, malaise/fatigue and weight loss.  HENT: Negative for congestion.   Respiratory: Negative.  Negative for cough and shortness of breath.   Cardiovascular: Positive for leg swelling. Negative for chest pain.  Gastrointestinal: Negative.  Negative for abdominal pain.  Genitourinary: Negative.   Neurological: Negative.  Negative for sensory change and weakness.  Psychiatric/Behavioral: Positive for depression. The patient is not nervous/anxious.     As per HPI. Otherwise, a complete review of systems is negative.  PAST MEDICAL HISTORY: Past Medical History:  Diagnosis Date  . Arrhythmia    Atrial Fibrillation; Atrial Flutter w/RVR; SVT  . Depression   . Diabetes ([place])   . Dysrhythmia   . GERD (gastroesophageal reflux disease)   . Hypercholesteremia   . Hypertension   . Schizophrenia ([place])   . Sleep apnea   . Tremor     PAST SURGICAL HISTORY: Past Surgical History:  Procedure Laterality Date  . CARDIOVERSION     x2,  a-fib  . COLONOSCOPY WITH PROPOFOL N/A 02/25/2015   Procedure: COLONOSCOPY WITH PROPOFOL;  Surgeon: Josefine Class, MD;  Location: [facility] ENDOSCOPY;  Service: Endoscopy;  Laterality: N/A;  . COLONOSCOPY WITH PROPOFOL N/A 04/12/2015   Procedure: COLONOSCOPY WITH PROPOFOL;  Surgeon: Josefine Class, MD;  Location: [facility] ENDOSCOPY;  Service: Endoscopy;  Laterality: N/A;    FAMILY HISTORY: Reviewed and unchanged. No reported history of malignancy or chronic disease.  ADVANCED DIRECTIVES (Y/N):  N  HEALTH MAINTENANCE: Social History  Substance Use Topics  . Smoking status: Never Smoker  . Smokeless tobacco: Never Used  . Alcohol use No     Colonoscopy:  PAP:  Bone density:  Lipid panel:  No Known Allergies  Current Outpatient Prescriptions  Medication Sig Dispense Refill  . allopurinol (ZYLOPRIM) 300 MG tablet Take 1 tablet (300 mg total) by mouth daily. Start treatment 1 week prior to initiating Tasigna. 30 tablet 2  . benztropine (COGENTIN) 1 MG tablet Take 1 mg by mouth 2 (two) times daily.     . cloNIDine (CATAPRES) 0.1 MG tablet Take 0.1 mg by mouth daily as needed (for shaking).    Marland Kitchen divalproex (DEPAKOTE ER) 500 MG 24 hr tablet Take 2,000 mg by mouth at bedtime.    Marland Kitchen FLUoxetine (PROZAC) 20 MG tablet Take 20 mg by mouth daily.    . fluPHENAZine (PROLIXIN) 5 MG tablet Take 5 mg by mouth 2 (two) times daily. In the evening and at bedtime    . furosemide (LASIX) 20 MG tablet Take 20 mg by mouth daily.    Marland Kitchen  gabapentin (NEURONTIN) 300 MG capsule Take 300 mg by mouth at bedtime.     Marland Kitchen LORazepam (ATIVAN) 0.5 MG tablet Take 0.5 mg by mouth 2 (two) times daily.    . metFORMIN (GLUCOPHAGE) 500 MG tablet Take 500 mg by mouth 2 (two) times daily with a meal.    . metoprolol succinate (TOPROL-XL) 25 MG 24 hr tablet Take 25 mg by mouth 2 (two) times daily.     . Multiple Vitamins-Iron (MULTIVITAMINS WITH IRON) TABS tablet Take 1 tablet by mouth daily.    . nilotinib (TASIGNA) 200 MG  capsule Take 2 capsules (400 mg total) by mouth every 12 (twelve) hours. Give on an empty stomach 1 hour before or 2 hours after meals. 120 capsule 5  . pantoprazole (PROTONIX) 40 MG tablet Take 40 mg by mouth daily.    . polyethylene glycol (MIRALAX / GLYCOLAX) packet Take 17 g by mouth daily.    . QUEtiapine (SEROQUEL) 400 MG tablet Take 300 mg by mouth at bedtime.     Marland Kitchen rOPINIRole (REQUIP) 2 MG tablet Take 2 mg by mouth at bedtime.    . rosuvastatin (CRESTOR) 20 MG tablet Take 20 mg by mouth at bedtime.     Minus Liberty Tosylate (INGREZZA) 40 MG CAPS Take 2 capsules by mouth daily.     No current facility-administered medications for this visit.     OBJECTIVE: There were no vitals filed for this visit.   There is no height or weight on file to calculate BMI.    ECOG FS:0 - Asymptomatic  General: Well-developed, well-nourished, no acute distress. Eyes: Pink conjunctiva, anicteric sclera. Lungs: Clear to auscultation bilaterally. Heart: Regular rate and rhythm. No rubs, murmurs, or gallops. Abdomen: Soft, nontender, nondistended. No organomegaly noted, normoactive bowel sounds. Musculoskeletal: No edema, cyanosis, or clubbing. Neuro: Alert, answering all questions appropriately. Cranial nerves grossly intact. Skin: No rashes or petechiae noted. Psych: Normal affect.  LAB RESULTS:  Lab Results  Component Value Date   NA 135 05/07/2016   K 3.7 05/07/2016   CL 109 05/07/2016   CO2 23 05/07/2016   GLUCOSE 146 (H) 05/07/2016   BUN 10 05/07/2016   CREATININE 1.21 05/07/2016   CALCIUM 7.9 (L) 05/07/2016   PROT 7.1 05/07/2016   ALBUMIN 3.0 (L) 05/07/2016   AST 23 05/07/2016   ALT 15 (L) 05/07/2016   ALKPHOS 111 05/07/2016   BILITOT 0.8 05/07/2016   GFRNONAA >60 05/07/2016   GFRAA >60 05/07/2016    Lab Results  Component Value Date   WBC 2.9 (L) 05/07/2016   NEUTROABS 1.8 05/07/2016   HGB 8.8 (L) 05/07/2016   HCT 27.0 (L) 05/07/2016   MCV 91.9 05/07/2016   PLT 115 (L)  05/07/2016     STUDIES: No results found.  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 Continue Tasigna 200 mg every 12 hours. Okay to discontinue allopurinol. Patient is slightly pancytopenic, but will continue to monitor and may have to dose reduce Tasigna. Return to clinic in 6 weeks for laboratory work and then in 3 months for laboratory work and further evaluation.  2. Anemia: Possibly secondary to treatment, monitor. 3. Thrombocytopenia: Likely secondary to Tasigna, repeat laboratory work as above. 4. Leukopenia: Discontinue allopurinol. Monitor.  5. Depression: Discuss with psychiatrist and request confidentiality.   Patient expressed understanding and was in agreement with this plan. He also understands that He can call clinic at any time with any questions, concerns, or complaints.  Lucendia Herrlich, NP  05/07/2016  11:13 AM  Patient was seen and evaluated independently and I agree with the assessment and plan as dictated above.  Lloyd Huger, MD 08/05/16 11:57 PM

## 2016-08-06 ENCOUNTER — Inpatient Hospital Stay: Payer: Medicare Other

## 2016-08-06 ENCOUNTER — Inpatient Hospital Stay: Payer: Medicare Other | Admitting: Oncology

## 2016-08-25 NOTE — Progress Notes (Signed)
[place]  Telephone:(336) [phone] Fax:(336) [phone]  ID: Lawrence Johnston OB: 08/25/1960  MR#: [id number]  STM#:[id number]  Patient Care Team: Idelle Crouch, MD as PCP - General (Internal Medicine)  CHIEF COMPLAINT: CML  INTERVAL HISTORY: Patient returns to clinic today for evaluation and laboratory work. He continues to tolerate to Tasigna well without significant side effects. He currently feels well and remains asymptomatic. He does not complain of fevers, night sweats, or weight loss. He denies any chest pain or shortness of breath. He has no nausea, vomiting, constipation, or diarrhea. He has no urinary complaints. Patient offers no specific complaints today.  REVIEW OF SYSTEMS:   Review of Systems  Constitutional: Negative.  Negative for chills, diaphoresis, fever, malaise/fatigue and weight loss.  HENT: Negative for congestion.   Respiratory: Negative.  Negative for cough and shortness of breath.   Cardiovascular: Negative for chest pain and leg swelling.  Gastrointestinal: Negative.  Negative for abdominal pain.  Genitourinary: Negative.   Musculoskeletal: Negative.   Skin: Negative.  Negative for rash.  Neurological: Negative.  Negative for sensory change and weakness.  Endo/Heme/Allergies: Negative.  Does not bruise/bleed easily.  Psychiatric/Behavioral: Positive for depression. The patient is not nervous/anxious.     As per HPI. Otherwise, a complete review of systems is negative.  PAST MEDICAL HISTORY: Past Medical History:  Diagnosis Date  . Arrhythmia    Atrial Fibrillation; Atrial Flutter w/RVR; SVT  . Depression   . Diabetes ([place])   . Dysrhythmia   . GERD (gastroesophageal reflux disease)   . Hypercholesteremia   . Hypertension   . Schizophrenia ([place])   . Sleep apnea   . Tremor     PAST SURGICAL HISTORY: Past Surgical History:  Procedure Laterality Date  . CARDIOVERSION     x2, a-fib  . COLONOSCOPY WITH PROPOFOL N/A 02/25/2015    Procedure: COLONOSCOPY WITH PROPOFOL;  Surgeon: Josefine Class, MD;  Location: [facility] ENDOSCOPY;  Service: Endoscopy;  Laterality: N/A;  . COLONOSCOPY WITH PROPOFOL N/A 04/12/2015   Procedure: COLONOSCOPY WITH PROPOFOL;  Surgeon: Josefine Class, MD;  Location: [facility] ENDOSCOPY;  Service: Endoscopy;  Laterality: N/A;    FAMILY HISTORY: Reviewed and unchanged. No reported history of malignancy or chronic disease.  ADVANCED DIRECTIVES (Y/N):  N  HEALTH MAINTENANCE: Social History  Substance Use Topics  . Smoking status: Never Smoker  . Smokeless tobacco: Never Used  . Alcohol use No     Colonoscopy:  PAP:  Bone density:  Lipid panel:  No Known Allergies  Current Outpatient Prescriptions  Medication Sig Dispense Refill  . benztropine (COGENTIN) 1 MG tablet Take 1 mg by mouth 2 (two) times daily.     . cloNIDine (CATAPRES) 0.1 MG tablet Take 0.1 mg by mouth daily as needed (for shaking).    Marland Kitchen divalproex (DEPAKOTE ER) 500 MG 24 hr tablet Take 2,000 mg by mouth at bedtime.    . docusate sodium (COLACE) 100 MG capsule TAKE (1) CAPSULE BY MOUTH ONCE DAILY.    Marland Kitchen FLUoxetine (PROZAC) 20 MG tablet Take 20 mg by mouth daily.    . fluPHENAZine (PROLIXIN) 5 MG tablet Take 5 mg by mouth 2 (two) times daily. In the evening and at bedtime    . furosemide (LASIX) 20 MG tablet Take 20 mg by mouth daily.    Marland Kitchen gabapentin (NEURONTIN) 300 MG capsule Take 300 mg by mouth at bedtime.     Marland Kitchen LORazepam (ATIVAN) 0.5 MG tablet Take 0.5 mg by mouth 2 (  two) times daily.    . metFORMIN (GLUCOPHAGE) 500 MG tablet Take 500 mg by mouth 2 (two) times daily with a meal.    . metoprolol succinate (TOPROL-XL) 25 MG 24 hr tablet Take 25 mg by mouth 2 (two) times daily.     . Multiple Vitamins-Iron (MULTIVITAMINS WITH IRON) TABS tablet Take 1 tablet by mouth daily.    . nilotinib (TASIGNA) 200 MG capsule Take 2 capsules (400 mg total) by mouth every 12 (twelve) hours. Give on an empty stomach 1 hour before or 2  hours after meals. 120 capsule 5  . pantoprazole (PROTONIX) 40 MG tablet Take 40 mg by mouth daily.    . polyethylene glycol (MIRALAX / GLYCOLAX) packet Take 17 g by mouth daily.    . QUEtiapine (SEROQUEL) 400 MG tablet Take 300 mg by mouth at bedtime.     Marland Kitchen rOPINIRole (REQUIP) 2 MG tablet Take 2 mg by mouth at bedtime.    . rosuvastatin (CRESTOR) 20 MG tablet Take 20 mg by mouth at bedtime.     . sitaGLIPtin (JANUVIA) 100 MG tablet Take by mouth.    . Triamcinolone Acetonide (TRIAMCINOLONE 0.1 % CREAM : EUCERIN) CREA Apply topically.    Minus Liberty Tosylate (INGREZZA) 40 MG CAPS Take 2 capsules by mouth daily.    Marland Kitchen allopurinol (ZYLOPRIM) 300 MG tablet Take 1 tablet (300 mg total) by mouth daily. Start treatment 1 week prior to initiating Tasigna. (Patient not taking: Reported on 08/26/2016) 30 tablet 2   No current facility-administered medications for this visit.     OBJECTIVE: Vitals:   08/26/16 1534  BP: 98/63  Pulse: 65  Resp: 18  Temp: 98.6 F (37 C)     Body mass index is 30.89 kg/m.    ECOG FS:0 - Asymptomatic  General: Well-developed, well-nourished, no acute distress. Eyes: Pink conjunctiva, anicteric sclera. Lungs: Clear to auscultation bilaterally. Heart: Regular rate and rhythm. No rubs, murmurs, or gallops. Abdomen: Soft, nontender, nondistended. No organomegaly noted, normoactive bowel sounds. Musculoskeletal: No edema, cyanosis, or clubbing. Neuro: Alert, answering all questions appropriately. Cranial nerves grossly intact. Skin: No rashes or petechiae noted. Psych: Normal affect.  LAB RESULTS:  Lab Results  Component Value Date   NA 137 08/26/2016   K 3.4 (L) 08/26/2016   CL 108 08/26/2016   CO2 22 08/26/2016   GLUCOSE 167 (H) 08/26/2016   BUN 15 08/26/2016   CREATININE 1.52 (H) 08/26/2016   CALCIUM 8.6 (L) 08/26/2016   PROT 7.4 08/26/2016   ALBUMIN 3.7 08/26/2016   AST 29 08/26/2016   ALT 26 08/26/2016   ALKPHOS 77 08/26/2016   BILITOT 1.0  08/26/2016   GFRNONAA 50 (L) 08/26/2016   GFRAA 57 (L) 08/26/2016    Lab Results  Component Value Date   WBC 2.3 (L) 08/26/2016   NEUTROABS 1.1 (L) 08/26/2016   HGB 11.1 (L) 08/26/2016   HCT 32.6 (L) 08/26/2016   MCV 86.3 08/26/2016   PLT 43 (L) 08/26/2016     STUDIES: No results found.  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 BCR-ABL is pending at time of dictation. Given patient's pancytopenia, he has been instructed to hold Tasigna 400 mg every 12 hours for 4 weeks. Okay to discontinue allopurinol. Will repeat laboratory work at that time, and likely dose reduce treatment to 200 mg twice per day.  2. Pancytopenia: Hold treatment as above.  3. Renal insufficiency: Patient's creatinine appears to be at his  baseline, monitor.  Patient expressed understanding and was in agreement with this plan. He also understands that He can call clinic at any time with any questions, concerns, or complaints.    Lloyd Huger, MD 08/26/16 3:50 PM

## 2016-08-26 ENCOUNTER — Inpatient Hospital Stay: Payer: Medicare Other | Attending: Oncology | Admitting: Oncology

## 2016-08-26 ENCOUNTER — Inpatient Hospital Stay: Payer: Medicare Other

## 2016-08-26 VITALS — BP 98/63 | HR 65 | Temp 98.6°F | Resp 18 | Wt 221.5 lb

## 2016-08-26 DIAGNOSIS — E119 Type 2 diabetes mellitus without complications: Secondary | ICD-10-CM | POA: Diagnosis not present

## 2016-08-26 DIAGNOSIS — G4730 Sleep apnea, unspecified: Secondary | ICD-10-CM | POA: Diagnosis not present

## 2016-08-26 DIAGNOSIS — E7800 Pure hypercholesterolemia, unspecified: Secondary | ICD-10-CM | POA: Insufficient documentation

## 2016-08-26 DIAGNOSIS — C9210 Chronic myeloid leukemia, BCR/ABL-positive, not having achieved remission: Secondary | ICD-10-CM | POA: Diagnosis present

## 2016-08-26 DIAGNOSIS — D61818 Other pancytopenia: Secondary | ICD-10-CM | POA: Diagnosis not present

## 2016-08-26 DIAGNOSIS — I10 Essential (primary) hypertension: Secondary | ICD-10-CM | POA: Diagnosis not present

## 2016-08-26 DIAGNOSIS — N2889 Other specified disorders of kidney and ureter: Secondary | ICD-10-CM | POA: Diagnosis not present

## 2016-08-26 DIAGNOSIS — K219 Gastro-esophageal reflux disease without esophagitis: Secondary | ICD-10-CM | POA: Diagnosis not present

## 2016-08-26 DIAGNOSIS — F209 Schizophrenia, unspecified: Secondary | ICD-10-CM | POA: Insufficient documentation

## 2016-08-26 DIAGNOSIS — F329 Major depressive disorder, single episode, unspecified: Secondary | ICD-10-CM | POA: Diagnosis not present

## 2016-08-26 DIAGNOSIS — Z79899 Other long term (current) drug therapy: Secondary | ICD-10-CM | POA: Insufficient documentation

## 2016-08-26 DIAGNOSIS — Z7984 Long term (current) use of oral hypoglycemic drugs: Secondary | ICD-10-CM | POA: Insufficient documentation

## 2016-08-26 DIAGNOSIS — R251 Tremor, unspecified: Secondary | ICD-10-CM | POA: Insufficient documentation

## 2016-08-26 DIAGNOSIS — I4891 Unspecified atrial fibrillation: Secondary | ICD-10-CM | POA: Insufficient documentation

## 2016-08-26 LAB — CBC WITH DIFFERENTIAL/PLATELET
BASOS ABS: 0 10*3/uL (ref 0–0.1)
BASOS PCT: 1 %
EOS ABS: 0.1 10*3/uL (ref 0–0.7)
Eosinophils Relative: 4 %
HCT: 32.6 % — ABNORMAL LOW (ref 40.0–52.0)
HEMOGLOBIN: 11.1 g/dL — AB (ref 13.0–18.0)
LYMPHS ABS: 0.6 10*3/uL — AB (ref 1.0–3.6)
Lymphocytes Relative: 27 %
MCH: 29.5 pg (ref 26.0–34.0)
MCHC: 34.2 g/dL (ref 32.0–36.0)
MCV: 86.3 fL (ref 80.0–100.0)
Monocytes Absolute: 0.4 10*3/uL (ref 0.2–1.0)
Monocytes Relative: 18 %
NEUTROS PCT: 50 %
Neutro Abs: 1.1 10*3/uL — ABNORMAL LOW (ref 1.4–6.5)
PLATELETS: 43 10*3/uL — AB (ref 150–440)
RBC: 3.78 MIL/uL — AB (ref 4.40–5.90)
RDW: 18.8 % — ABNORMAL HIGH (ref 11.5–14.5)
WBC: 2.3 10*3/uL — AB (ref 3.8–10.6)

## 2016-08-26 LAB — COMPREHENSIVE METABOLIC PANEL
ALBUMIN: 3.7 g/dL (ref 3.5–5.0)
ALT: 26 U/L (ref 17–63)
AST: 29 U/L (ref 15–41)
Alkaline Phosphatase: 77 U/L (ref 38–126)
Anion gap: 7 (ref 5–15)
BUN: 15 mg/dL (ref 6–20)
CHLORIDE: 108 mmol/L (ref 101–111)
CO2: 22 mmol/L (ref 22–32)
CREATININE: 1.52 mg/dL — AB (ref 0.61–1.24)
Calcium: 8.6 mg/dL — ABNORMAL LOW (ref 8.9–10.3)
GFR calc Af Amer: 57 mL/min — ABNORMAL LOW (ref >60)
GFR calc non Af Amer: 50 mL/min — ABNORMAL LOW (ref >60)
GLUCOSE: 167 mg/dL — AB (ref 65–99)
Potassium: 3.4 mmol/L — ABNORMAL LOW (ref 3.5–5.1)
SODIUM: 137 mmol/L (ref 135–145)
Total Bilirubin: 1 mg/dL (ref 0.3–1.2)
Total Protein: 7.4 g/dL (ref 6.5–8.1)

## 2016-09-01 LAB — BCR-ABL1, CML/ALL, PCR, QUANT

## 2016-09-15 ENCOUNTER — Telehealth: Payer: Self-pay

## 2016-09-15 NOTE — Telephone Encounter (Signed)
-----   Message from Johney Maine, RN sent at 09/15/2016 11:12 AM EDT ----- Regarding: Medication Follow up FYI.Marland KitchenMarland Kitchen  Update received from [place] regarding status of patients Tasigna, patient is not currently on medication as he states he is not ready to begin therapy. Patients prescription has been placed on hold for now.

## 2016-09-29 NOTE — Progress Notes (Signed)
[place]  Telephone:(336) [phone] Fax:(336) [phone]  ID: Lawrence Johnston OB: 1960-11-30  MR#: [id number]  SAY#:[id number]  Patient Care Team: Idelle Crouch, MD as PCP - General (Internal Medicine)  CHIEF COMPLAINT: CML  INTERVAL HISTORY: Patient returns to clinic today for evaluation and laboratory work. He currently feels well and remains asymptomatic. He does not complain of fevers, night sweats, or weight loss. He denies any chest pain or shortness of breath. He has no nausea, vomiting, constipation, or diarrhea. He has no urinary complaints. Patient offers no specific complaints today.  REVIEW OF SYSTEMS:   Review of Systems  Constitutional: Negative.  Negative for chills, diaphoresis, fever, malaise/fatigue and weight loss.  HENT: Negative for congestion.   Respiratory: Negative.  Negative for cough and shortness of breath.   Cardiovascular: Negative.  Negative for chest pain and leg swelling.  Gastrointestinal: Negative.  Negative for abdominal pain.  Genitourinary: Negative.   Musculoskeletal: Negative.   Skin: Negative.  Negative for rash.  Neurological: Negative.  Negative for sensory change and weakness.  Endo/Heme/Allergies: Negative.  Does not bruise/bleed easily.  Psychiatric/Behavioral: Negative.  Negative for depression. The patient is not nervous/anxious.     As per HPI. Otherwise, a complete review of systems is negative.  PAST MEDICAL HISTORY: Past Medical History:  Diagnosis Date  . Arrhythmia    Atrial Fibrillation; Atrial Flutter w/RVR; SVT  . Depression   . Diabetes ([place])   . Dysrhythmia   . GERD (gastroesophageal reflux disease)   . Hypercholesteremia   . Hypertension   . Schizophrenia ([place])   . Sleep apnea   . Tremor     PAST SURGICAL HISTORY: Past Surgical History:  Procedure Laterality Date  . CARDIOVERSION     x2, a-fib  . COLONOSCOPY WITH PROPOFOL N/A 02/25/2015   Procedure: COLONOSCOPY WITH PROPOFOL;  Surgeon:  Josefine Class, MD;  Location: [facility] ENDOSCOPY;  Service: Endoscopy;  Laterality: N/A;  . COLONOSCOPY WITH PROPOFOL N/A 04/12/2015   Procedure: COLONOSCOPY WITH PROPOFOL;  Surgeon: Josefine Class, MD;  Location: [facility] ENDOSCOPY;  Service: Endoscopy;  Laterality: N/A;    FAMILY HISTORY: Reviewed and unchanged. No reported history of malignancy or chronic disease.  ADVANCED DIRECTIVES (Y/N):  N  HEALTH MAINTENANCE: Social History  Substance Use Topics  . Smoking status: Never Smoker  . Smokeless tobacco: Never Used  . Alcohol use No     Colonoscopy:  PAP:  Bone density:  Lipid panel:  No Known Allergies  Current Outpatient Prescriptions  Medication Sig Dispense Refill  . divalproex (DEPAKOTE ER) 500 MG 24 hr tablet Take 2,000 mg by mouth at bedtime.    . docusate sodium (COLACE) 100 MG capsule TAKE (1) CAPSULE BY MOUTH ONCE DAILY.    Marland Kitchen gabapentin (NEURONTIN) 300 MG capsule Take 300 mg by mouth at bedtime.     Marland Kitchen LORazepam (ATIVAN) 0.5 MG tablet Take 0.5 mg by mouth 2 (two) times daily.    . nilotinib (TASIGNA) 200 MG capsule Take 2 capsules (400 mg total) by mouth every 12 (twelve) hours. Give on an empty stomach 1 hour before or 2 hours after meals. 120 capsule 5  . QUEtiapine (SEROQUEL) 400 MG tablet Take 300 mg by mouth at bedtime.     Marland Kitchen rOPINIRole (REQUIP) 2 MG tablet Take 2 mg by mouth at bedtime.    . rosuvastatin (CRESTOR) 20 MG tablet Take 20 mg by mouth at bedtime.     Marland Kitchen allopurinol (ZYLOPRIM) 300 MG tablet Take 1  tablet (300 mg total) by mouth daily. Start treatment 1 week prior to initiating Tasigna. (Patient not taking: Reported on 08/26/2016) 30 tablet 2  . benztropine (COGENTIN) 1 MG tablet Take 1 mg by mouth 2 (two) times daily.     . cloNIDine (CATAPRES) 0.1 MG tablet Take 0.1 mg by mouth daily as needed (for shaking).    Marland Kitchen FLUoxetine (PROZAC) 20 MG tablet Take 20 mg by mouth daily.    . fluPHENAZine (PROLIXIN) 5 MG tablet Take 5 mg by mouth 2 (two) times  daily. In the evening and at bedtime    . furosemide (LASIX) 20 MG tablet Take 20 mg by mouth daily.    . metFORMIN (GLUCOPHAGE) 500 MG tablet Take 500 mg by mouth 2 (two) times daily with a meal.    . metoprolol succinate (TOPROL-XL) 25 MG 24 hr tablet Take 25 mg by mouth 2 (two) times daily.     . Multiple Vitamins-Iron (MULTIVITAMINS WITH IRON) TABS tablet Take 1 tablet by mouth daily.    . pantoprazole (PROTONIX) 40 MG tablet Take 40 mg by mouth daily.    . polyethylene glycol (MIRALAX / GLYCOLAX) packet Take 17 g by mouth daily.    . sitaGLIPtin (JANUVIA) 100 MG tablet Take by mouth.    . Triamcinolone Acetonide (TRIAMCINOLONE 0.1 % CREAM : EUCERIN) CREA Apply topically.    Min[place] Tosylate (INGREZZA) 40 MG CAPS Take 2 capsules by mouth daily.     No current facility-administered medications for this visit.     OBJECTIVE: Vitals:   09/30/16 1529  BP: 130/80  Pulse: 65  Resp: 20  Temp: 98.7 F (37.1 C)     Body mass index is 31.24 kg/m.    ECOG FS:0 - Asymptomatic  General: Well-developed, well-nourished, no acute distress. Eyes: Pink conjunctiva, anicteric sclera. Lungs: Clear to auscultation bilaterally. Heart: Regular rate and rhythm. No rubs, murmurs, or gallops. Abdomen: Soft, nontender, nondistended. No organomegaly noted, normoactive bowel sounds. Musculoskeletal: No edema, cyanosis, or clubbing. Neuro: Alert, answering all questions appropriately. Cranial nerves grossly intact. Skin: No rashes or petechiae noted. Psych: Normal affect.  LAB RESULTS:  Lab Results  Component Value Date   NA 136 09/30/2016   K 3.9 09/30/2016   CL 106 09/30/2016   CO2 25 09/30/2016   GLUCOSE 122 (H) 09/30/2016   BUN 15 09/30/2016   CREATININE 1.23 09/30/2016   CALCIUM 9.2 09/30/2016   PROT 7.2 09/30/2016   ALBUMIN 3.8 09/30/2016   AST 28 09/30/2016   ALT 22 09/30/2016   ALKPHOS 52 09/30/2016   BILITOT 0.6 09/30/2016   GFRNONAA >60 09/30/2016   GFRAA >60 09/30/2016     Lab Results  Component Value Date   WBC 2.3 (L) 09/30/2016   NEUTROABS 1.3 (L) 09/30/2016   HGB 11.5 (L) 09/30/2016   HCT 33.8 (L) 09/30/2016   MCV 91.9 09/30/2016   PLT 73 (L) 09/30/2016     STUDIES: No results found.  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 BCR-ABL is pending at time of dictation. Although his most recent test 1 month ago was negative. Given patient's persistent pancytopenia, Tasigna has been dose reduced to 200 mg daily. Okay to discontinue allopurinol. Return to clinic in 4 weeks with repeat laboratory work and further evaluation.  2. Pancytopenia: Dose reduce Tasigna as above.  3. Renal insufficiency: Patient's creatinine is within normal limits today.  Patient expressed understanding and was in agreement with this plan.  He also understands that He can call clinic at any time with any questions, concerns, or complaints.    Lloyd Huger, MD 10/03/16 10:42 PM

## 2016-09-30 ENCOUNTER — Inpatient Hospital Stay: Payer: Medicare Other | Attending: Oncology

## 2016-09-30 ENCOUNTER — Inpatient Hospital Stay (HOSPITAL_BASED_OUTPATIENT_CLINIC_OR_DEPARTMENT_OTHER): Payer: Medicare Other | Admitting: Oncology

## 2016-09-30 VITALS — BP 130/80 | HR 65 | Temp 98.7°F | Resp 20 | Wt 224.0 lb

## 2016-09-30 DIAGNOSIS — F209 Schizophrenia, unspecified: Secondary | ICD-10-CM | POA: Insufficient documentation

## 2016-09-30 DIAGNOSIS — F329 Major depressive disorder, single episode, unspecified: Secondary | ICD-10-CM | POA: Insufficient documentation

## 2016-09-30 DIAGNOSIS — I4891 Unspecified atrial fibrillation: Secondary | ICD-10-CM | POA: Insufficient documentation

## 2016-09-30 DIAGNOSIS — K219 Gastro-esophageal reflux disease without esophagitis: Secondary | ICD-10-CM

## 2016-09-30 DIAGNOSIS — Z7984 Long term (current) use of oral hypoglycemic drugs: Secondary | ICD-10-CM | POA: Insufficient documentation

## 2016-09-30 DIAGNOSIS — E119 Type 2 diabetes mellitus without complications: Secondary | ICD-10-CM | POA: Insufficient documentation

## 2016-09-30 DIAGNOSIS — D61818 Other pancytopenia: Secondary | ICD-10-CM | POA: Diagnosis not present

## 2016-09-30 DIAGNOSIS — G4730 Sleep apnea, unspecified: Secondary | ICD-10-CM

## 2016-09-30 DIAGNOSIS — E7800 Pure hypercholesterolemia, unspecified: Secondary | ICD-10-CM | POA: Diagnosis not present

## 2016-09-30 DIAGNOSIS — C9210 Chronic myeloid leukemia, BCR/ABL-positive, not having achieved remission: Secondary | ICD-10-CM

## 2016-09-30 DIAGNOSIS — Z79899 Other long term (current) drug therapy: Secondary | ICD-10-CM

## 2016-09-30 DIAGNOSIS — I10 Essential (primary) hypertension: Secondary | ICD-10-CM | POA: Diagnosis not present

## 2016-09-30 LAB — CBC WITH DIFFERENTIAL/PLATELET
Basophils Absolute: 0 10*3/uL (ref 0–0.1)
Basophils Relative: 1 %
Eosinophils Absolute: 0.1 10*3/uL (ref 0–0.7)
Eosinophils Relative: 3 %
HEMATOCRIT: 33.8 % — AB (ref 40.0–52.0)
Hemoglobin: 11.5 g/dL — ABNORMAL LOW (ref 13.0–18.0)
LYMPHS ABS: 0.5 10*3/uL — AB (ref 1.0–3.6)
Lymphocytes Relative: 24 %
MCH: 31.3 pg (ref 26.0–34.0)
MCHC: 34 g/dL (ref 32.0–36.0)
MCV: 91.9 fL (ref 80.0–100.0)
MONO ABS: 0.4 10*3/uL (ref 0.2–1.0)
MONOS PCT: 16 %
NEUTROS ABS: 1.3 10*3/uL — AB (ref 1.4–6.5)
NEUTROS PCT: 56 %
Platelets: 73 10*3/uL — ABNORMAL LOW (ref 150–440)
RBC: 3.68 MIL/uL — ABNORMAL LOW (ref 4.40–5.90)
RDW: 16.1 % — AB (ref 11.5–14.5)
WBC: 2.3 10*3/uL — ABNORMAL LOW (ref 3.8–10.6)

## 2016-09-30 LAB — COMPREHENSIVE METABOLIC PANEL
ALK PHOS: 52 U/L (ref 38–126)
ALT: 22 U/L (ref 17–63)
ANION GAP: 5 (ref 5–15)
AST: 28 U/L (ref 15–41)
Albumin: 3.8 g/dL (ref 3.5–5.0)
BILIRUBIN TOTAL: 0.6 mg/dL (ref 0.3–1.2)
BUN: 15 mg/dL (ref 6–20)
CALCIUM: 9.2 mg/dL (ref 8.9–10.3)
CO2: 25 mmol/L (ref 22–32)
Chloride: 106 mmol/L (ref 101–111)
Creatinine, Ser: 1.23 mg/dL (ref 0.61–1.24)
GFR calc Af Amer: >60 mL/min (ref >60)
GLUCOSE: 122 mg/dL — AB (ref 65–99)
POTASSIUM: 3.9 mmol/L (ref 3.5–5.1)
Sodium: 136 mmol/L (ref 135–145)
TOTAL PROTEIN: 7.2 g/dL (ref 6.5–8.1)

## 2016-09-30 NOTE — Progress Notes (Signed)
Patient denies any concerns today.  

## 2016-10-05 LAB — BCR-ABL1, CML/ALL, PCR, QUANT: b3a2 transcript: 0.5711 %

## 2016-10-27 NOTE — Progress Notes (Signed)
[name]  Telephone:(336) [phone] Fax:(336) [phone]  ID: Lawrence Johnston OB: 1960/06/23  MR#: [id number]  XLK#:[id number]  Patient Care Team: Lawrence Crouch, MD as PCP - General (Internal Medicine)  CHIEF COMPLAINT: CML  INTERVAL HISTORY: Patient returns to clinic today for evaluation and laboratory work. He continues to feel well and remains asymptomatic. He does not complain of fevers, night sweats, or weight loss. He denies any chest pain or shortness of breath. He has no nausea, vomiting, constipation, or diarrhea. He has no urinary complaints. Patient offers no specific complaints today.  REVIEW OF SYSTEMS:   Review of Systems  Constitutional: Negative.  Negative for chills, diaphoresis, fever, malaise/fatigue and weight loss.  HENT: Negative for congestion.   Respiratory: Negative.  Negative for cough and shortness of breath.   Cardiovascular: Negative.  Negative for chest pain and leg swelling.  Gastrointestinal: Negative.  Negative for abdominal pain.  Genitourinary: Negative.   Musculoskeletal: Negative.   Skin: Negative.  Negative for rash.  Neurological: Negative.  Negative for sensory change and weakness.  Endo/Heme/Allergies: Negative.  Does not bruise/bleed easily.  Psychiatric/Behavioral: Negative.  Negative for depression. The patient is not nervous/anxious.     As per HPI. Otherwise, a complete review of systems is negative.  PAST MEDICAL HISTORY: Past Medical History:  Diagnosis Date  . Arrhythmia    Atrial Fibrillation; Atrial Flutter w/RVR; SVT  . Depression   . Diabetes ([place])   . Dysrhythmia   . GERD (gastroesophageal reflux disease)   . Hypercholesteremia   . Hypertension   . Schizophrenia ([place])   . Sleep apnea   . Tremor     PAST SURGICAL HISTORY: Past Surgical History:  Procedure Laterality Date  . CARDIOVERSION     x2, a-fib  . COLONOSCOPY WITH PROPOFOL N/A 02/25/2015   Procedure: COLONOSCOPY WITH PROPOFOL;   Surgeon: Lawrence Class, MD;  Location: [facility] ENDOSCOPY;  Service: Endoscopy;  Laterality: N/A;  . COLONOSCOPY WITH PROPOFOL N/A 04/12/2015   Procedure: COLONOSCOPY WITH PROPOFOL;  Surgeon: Lawrence Class, MD;  Location: [facility] ENDOSCOPY;  Service: Endoscopy;  Laterality: N/A;    FAMILY HISTORY: Reviewed and unchanged. No reported history of malignancy or chronic disease.  ADVANCED DIRECTIVES (Y/N):  N  HEALTH MAINTENANCE: Social History  Substance Use Topics  . Smoking status: Never Smoker  . Smokeless tobacco: Never Used  . Alcohol use No     Colonoscopy:  PAP:  Bone density:  Lipid panel:  No Known Allergies  Current Outpatient Prescriptions  Medication Sig Dispense Refill  . allopurinol (ZYLOPRIM) 300 MG tablet Take 1 tablet (300 mg total) by mouth daily. Start treatment 1 week prior to initiating Tasigna. 30 tablet 2  . benztropine (COGENTIN) 1 MG tablet Take 1 mg by mouth 2 (two) times daily.     . cloNIDine (CATAPRES) 0.1 MG tablet Take 0.1 mg by mouth daily as needed (for shaking).    Marland Kitchen divalproex (DEPAKOTE ER) 500 MG 24 hr tablet Take 2,000 mg by mouth at bedtime.    . docusate sodium (COLACE) 100 MG capsule TAKE (1) CAPSULE BY MOUTH ONCE DAILY.    Marland Kitchen FLUoxetine (PROZAC) 20 MG tablet Take 20 mg by mouth daily.    . fluPHENAZine (PROLIXIN) 5 MG tablet Take 5 mg by mouth 2 (two) times daily. In the evening and at bedtime    . furosemide (LASIX) 20 MG tablet Take 20 mg by mouth daily.    Marland Kitchen gabapentin (NEURONTIN) 300 MG capsule Take 300  mg by mouth at bedtime.     Marland Kitchen LORazepam (ATIVAN) 0.5 MG tablet Take 0.5 mg by mouth 2 (two) times daily.    . metFORMIN (GLUCOPHAGE) 500 MG tablet Take 500 mg by mouth 2 (two) times daily with a meal.    . metoprolol succinate (TOPROL-XL) 25 MG 24 hr tablet Take 25 mg by mouth 2 (two) times daily.     . Multiple Vitamins-Iron (MULTIVITAMINS WITH IRON) TABS tablet Take 1 tablet by mouth daily.    . nilotinib (TASIGNA) 200 MG capsule  Take 2 capsules (400 mg total) by mouth every 12 (twelve) hours. Give on an empty stomach 1 hour before or 2 hours after meals. 120 capsule 5  . pantoprazole (PROTONIX) 40 MG tablet Take 40 mg by mouth daily.    . polyethylene glycol (MIRALAX / GLYCOLAX) packet Take 17 g by mouth daily.    . QUEtiapine (SEROQUEL) 400 MG tablet Take 300 mg by mouth at bedtime.     Marland Kitchen rOPINIRole (REQUIP) 2 MG tablet Take 2 mg by mouth at bedtime.    . rosuvastatin (CRESTOR) 20 MG tablet Take 20 mg by mouth at bedtime.     . sitaGLIPtin (JANUVIA) 100 MG tablet Take by mouth.    . Triamcinolone Acetonide (TRIAMCINOLONE 0.1 % CREAM : EUCERIN) CREA Apply topically.    Lawrence Johnston (INGREZZA) 40 MG CAPS Take 2 capsules by mouth daily.     No current facility-administered medications for this visit.     OBJECTIVE: Vitals:   10/28/16 1501  BP: 128/73  Pulse: 86  Resp: 20  Temp: 98.2 F (36.8 C)     Body mass index is 30.96 kg/m.    ECOG FS:0 - Asymptomatic  General: Well-developed, well-nourished, no acute distress. Eyes: Pink conjunctiva, anicteric sclera. Lungs: Clear to auscultation bilaterally. Heart: Regular rate and rhythm. No rubs, murmurs, or gallops. Abdomen: Soft, nontender, nondistended. No organomegaly noted, normoactive bowel sounds. Musculoskeletal: No edema, cyanosis, or clubbing. Neuro: Alert, answering all questions appropriately. Cranial nerves grossly intact. Skin: No rashes or petechiae noted. Psych: Normal affect.  LAB RESULTS:  Lab Results  Component Value Date   NA 139 10/28/2016   K 3.5 10/28/2016   CL 106 10/28/2016   CO2 26 10/28/2016   GLUCOSE 163 (H) 10/28/2016   BUN 16 10/28/2016   CREATININE 1.69 (H) 10/28/2016   CALCIUM 8.9 10/28/2016   PROT 7.3 10/28/2016   ALBUMIN 3.7 10/28/2016   AST 25 10/28/2016   ALT 20 10/28/2016   ALKPHOS 61 10/28/2016   BILITOT 0.5 10/28/2016   GFRNONAA 44 (L) 10/28/2016   GFRAA 51 (L) 10/28/2016    Lab Results    Component Value Date   WBC 2.0 (L) 10/28/2016   NEUTROABS 1.0 (L) 10/28/2016   HGB 11.8 (L) 10/28/2016   HCT 34.3 (L) 10/28/2016   MCV 90.6 10/28/2016   PLT 36 (L) 10/28/2016     STUDIES: No results found.  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 BCR-ABL is now increasing. Given patient's persistent pancytopenia, will discontinue Tasigna altogether. Okay to discontinue allopurinol. Return to clinic in 6 weeks with repeat laboratory work and further evaluation. Will initiate another treatment at that time.  2. Pancytopenia: Discontinue Tasigna as above.  3. Renal insufficiency: Patient's creatinine is mildly elevated today.  Approximately 30 minutes was spent in discussion of which 50% was consultation.   Patient expressed understanding and was in agreement with this plan.  He also understands that He can call clinic at any time with any questions, concerns, or complaints.    Lloyd Huger, MD 10/31/16 11:45 PM

## 2016-10-28 ENCOUNTER — Inpatient Hospital Stay: Payer: Medicare Other | Attending: Oncology

## 2016-10-28 ENCOUNTER — Telehealth: Payer: Self-pay

## 2016-10-28 ENCOUNTER — Inpatient Hospital Stay (HOSPITAL_BASED_OUTPATIENT_CLINIC_OR_DEPARTMENT_OTHER): Payer: Medicare Other | Admitting: Oncology

## 2016-10-28 VITALS — BP 128/73 | HR 86 | Temp 98.2°F | Resp 20 | Wt 222.0 lb

## 2016-10-28 DIAGNOSIS — F329 Major depressive disorder, single episode, unspecified: Secondary | ICD-10-CM | POA: Diagnosis not present

## 2016-10-28 DIAGNOSIS — E119 Type 2 diabetes mellitus without complications: Secondary | ICD-10-CM | POA: Diagnosis not present

## 2016-10-28 DIAGNOSIS — I4891 Unspecified atrial fibrillation: Secondary | ICD-10-CM | POA: Insufficient documentation

## 2016-10-28 DIAGNOSIS — F209 Schizophrenia, unspecified: Secondary | ICD-10-CM

## 2016-10-28 DIAGNOSIS — D61818 Other pancytopenia: Secondary | ICD-10-CM | POA: Diagnosis not present

## 2016-10-28 DIAGNOSIS — K219 Gastro-esophageal reflux disease without esophagitis: Secondary | ICD-10-CM

## 2016-10-28 DIAGNOSIS — Z7984 Long term (current) use of oral hypoglycemic drugs: Secondary | ICD-10-CM | POA: Insufficient documentation

## 2016-10-28 DIAGNOSIS — G4730 Sleep apnea, unspecified: Secondary | ICD-10-CM | POA: Insufficient documentation

## 2016-10-28 DIAGNOSIS — I10 Essential (primary) hypertension: Secondary | ICD-10-CM | POA: Diagnosis not present

## 2016-10-28 DIAGNOSIS — C9210 Chronic myeloid leukemia, BCR/ABL-positive, not having achieved remission: Secondary | ICD-10-CM | POA: Insufficient documentation

## 2016-10-28 DIAGNOSIS — N289 Disorder of kidney and ureter, unspecified: Secondary | ICD-10-CM | POA: Insufficient documentation

## 2016-10-28 DIAGNOSIS — E7800 Pure hypercholesterolemia, unspecified: Secondary | ICD-10-CM | POA: Diagnosis not present

## 2016-10-28 DIAGNOSIS — Z79899 Other long term (current) drug therapy: Secondary | ICD-10-CM | POA: Diagnosis not present

## 2016-10-28 LAB — COMPREHENSIVE METABOLIC PANEL
ALT: 20 U/L (ref 17–63)
ANION GAP: 7 (ref 5–15)
AST: 25 U/L (ref 15–41)
Albumin: 3.7 g/dL (ref 3.5–5.0)
Alkaline Phosphatase: 61 U/L (ref 38–126)
BUN: 16 mg/dL (ref 6–20)
CHLORIDE: 106 mmol/L (ref 101–111)
CO2: 26 mmol/L (ref 22–32)
Calcium: 8.9 mg/dL (ref 8.9–10.3)
Creatinine, Ser: 1.69 mg/dL — ABNORMAL HIGH (ref 0.61–1.24)
GFR calc Af Amer: 51 mL/min — ABNORMAL LOW (ref >60)
GFR, EST NON AFRICAN AMERICAN: 44 mL/min — AB (ref >60)
Glucose, Bld: 163 mg/dL — ABNORMAL HIGH (ref 65–99)
POTASSIUM: 3.5 mmol/L (ref 3.5–5.1)
Sodium: 139 mmol/L (ref 135–145)
Total Bilirubin: 0.5 mg/dL (ref 0.3–1.2)
Total Protein: 7.3 g/dL (ref 6.5–8.1)

## 2016-10-28 LAB — CBC WITH DIFFERENTIAL/PLATELET
BASOS ABS: 0 10*3/uL (ref 0–0.1)
Basophils Relative: 1 %
EOS PCT: 4 %
Eosinophils Absolute: 0.1 10*3/uL (ref 0–0.7)
HEMATOCRIT: 34.3 % — AB (ref 40.0–52.0)
HEMOGLOBIN: 11.8 g/dL — AB (ref 13.0–18.0)
LYMPHS ABS: 0.6 10*3/uL — AB (ref 1.0–3.6)
LYMPHS PCT: 29 %
MCH: 31.2 pg (ref 26.0–34.0)
MCHC: 34.4 g/dL (ref 32.0–36.0)
MCV: 90.6 fL (ref 80.0–100.0)
Monocytes Absolute: 0.3 10*3/uL (ref 0.2–1.0)
Monocytes Relative: 17 %
NEUTROS ABS: 1 10*3/uL — AB (ref 1.4–6.5)
NEUTROS PCT: 49 %
Platelets: 36 10*3/uL — ABNORMAL LOW (ref 150–440)
RBC: 3.79 MIL/uL — AB (ref 4.40–5.90)
RDW: 14.4 % (ref 11.5–14.5)
WBC: 2 10*3/uL — AB (ref 3.8–10.6)

## 2016-10-28 NOTE — Telephone Encounter (Signed)
-----   Message from [place] sent at 10/28/2016  2:00 PM EDT ----- Facility called and said hope to be here by 215 for Lab/MD/ at another appt- on way.Marland KitchenMarland Kitchen

## 2016-10-28 NOTE — Progress Notes (Signed)
Patient denies any concerns today.  

## 2016-11-03 LAB — BCR-ABL1, CML/ALL, PCR, QUANT: B3A2 TRANSCRIPT: 1.3062 %

## 2016-12-09 ENCOUNTER — Inpatient Hospital Stay (HOSPITAL_BASED_OUTPATIENT_CLINIC_OR_DEPARTMENT_OTHER): Payer: Medicare Other | Admitting: Oncology

## 2016-12-09 ENCOUNTER — Inpatient Hospital Stay: Payer: Medicare Other | Attending: Oncology

## 2016-12-09 VITALS — BP 120/74 | HR 60 | Temp 98.1°F | Resp 18 | Wt 226.0 lb

## 2016-12-09 DIAGNOSIS — D61818 Other pancytopenia: Secondary | ICD-10-CM

## 2016-12-09 DIAGNOSIS — E119 Type 2 diabetes mellitus without complications: Secondary | ICD-10-CM

## 2016-12-09 DIAGNOSIS — I4891 Unspecified atrial fibrillation: Secondary | ICD-10-CM

## 2016-12-09 DIAGNOSIS — G4730 Sleep apnea, unspecified: Secondary | ICD-10-CM | POA: Diagnosis not present

## 2016-12-09 DIAGNOSIS — N289 Disorder of kidney and ureter, unspecified: Secondary | ICD-10-CM | POA: Diagnosis not present

## 2016-12-09 DIAGNOSIS — I10 Essential (primary) hypertension: Secondary | ICD-10-CM | POA: Insufficient documentation

## 2016-12-09 DIAGNOSIS — K219 Gastro-esophageal reflux disease without esophagitis: Secondary | ICD-10-CM | POA: Insufficient documentation

## 2016-12-09 DIAGNOSIS — Z79899 Other long term (current) drug therapy: Secondary | ICD-10-CM | POA: Insufficient documentation

## 2016-12-09 DIAGNOSIS — C9210 Chronic myeloid leukemia, BCR/ABL-positive, not having achieved remission: Secondary | ICD-10-CM | POA: Insufficient documentation

## 2016-12-09 DIAGNOSIS — F209 Schizophrenia, unspecified: Secondary | ICD-10-CM

## 2016-12-09 DIAGNOSIS — F329 Major depressive disorder, single episode, unspecified: Secondary | ICD-10-CM

## 2016-12-09 DIAGNOSIS — E7800 Pure hypercholesterolemia, unspecified: Secondary | ICD-10-CM | POA: Diagnosis not present

## 2016-12-09 NOTE — Progress Notes (Signed)
[place]  Telephone:(336) [phone] Fax:(336) [phone]  ID: Jackelyn Hoehn OB: [birth year]  MR#: [id number]  WNU#:[id number]  Patient Care Team: Idelle Crouch, MD as PCP - General (Internal Medicine)  CHIEF COMPLAINT: CML  INTERVAL HISTORY: Patient returns to clinic today for evaluation, laboratory work, and treatment planning. He continues to feel well and remains asymptomatic. He does not complain of fevers, night sweats, or weight loss. He denies any chest pain or shortness of breath. He has no nausea, vomiting, constipation, or diarrhea. He has no urinary complaints. Patient offers no specific complaints today.  REVIEW OF SYSTEMS:   Review of Systems  Constitutional: Negative.  Negative for chills, diaphoresis, fever, malaise/fatigue and weight loss.  HENT: Negative for congestion.   Respiratory: Negative.  Negative for cough and shortness of breath.   Cardiovascular: Negative.  Negative for chest pain and leg swelling.  Gastrointestinal: Negative.  Negative for abdominal pain.  Genitourinary: Negative.   Musculoskeletal: Negative.   Skin: Negative.  Negative for rash.  Neurological: Negative.  Negative for sensory change and weakness.  Endo/Heme/Allergies: Negative.  Does not bruise/bleed easily.  Psychiatric/Behavioral: Negative.  Negative for depression. The patient is not nervous/anxious.     As per HPI. Otherwise, a complete review of systems is negative.  PAST MEDICAL HISTORY: Past Medical History:  Diagnosis Date  . Arrhythmia    Atrial Fibrillation; Atrial Flutter w/RVR; SVT  . Depression   . Diabetes ([place])   . Dysrhythmia   . GERD (gastroesophageal reflux disease)   . Hypercholesteremia   . Hypertension   . Schizophrenia ([employer])   . Sleep apnea   . Tremor     PAST SURGICAL HISTORY: Past Surgical History:  Procedure Laterality Date  . CARDIOVERSION     x2, a-fib  . COLONOSCOPY WITH PROPOFOL N/A 02/25/2015   Procedure: COLONOSCOPY  WITH PROPOFOL;  Surgeon: Josefine Class, MD;  Location: [facility] ENDOSCOPY;  Service: Endoscopy;  Laterality: N/A;  . COLONOSCOPY WITH PROPOFOL N/A 04/12/2015   Procedure: COLONOSCOPY WITH PROPOFOL;  Surgeon: Josefine Class, MD;  Location: [facility] ENDOSCOPY;  Service: Endoscopy;  Laterality: N/A;    FAMILY HISTORY: Reviewed and unchanged. No reported history of malignancy or chronic disease.  ADVANCED DIRECTIVES (Y/N):  N  HEALTH MAINTENANCE: Social History  Substance Use Topics  . Smoking status: Never Smoker  . Smokeless tobacco: Never Used  . Alcohol use No     Colonoscopy:  PAP:  Bone density:  Lipid panel:  No Known Allergies  Current Outpatient Prescriptions  Medication Sig Dispense Refill  . allopurinol (ZYLOPRIM) 300 MG tablet Take 1 tablet (300 mg total) by mouth daily. Start treatment 1 week prior to initiating Tasigna. 30 tablet 2  . benztropine (COGENTIN) 1 MG tablet Take 1 mg by mouth 2 (two) times daily.     . cloNIDine (CATAPRES) 0.1 MG tablet Take 0.1 mg by mouth daily as needed (for shaking).    Marland Kitchen divalproex (DEPAKOTE ER) 500 MG 24 hr tablet Take 2,000 mg by mouth at bedtime.    . docusate sodium (COLACE) 100 MG capsule TAKE (1) CAPSULE BY MOUTH ONCE DAILY.    Marland Kitchen FLUoxetine (PROZAC) 20 MG tablet Take 20 mg by mouth daily.    . fluPHENAZine (PROLIXIN) 5 MG tablet Take 5 mg by mouth 2 (two) times daily. In the evening and at bedtime    . furosemide (LASIX) 20 MG tablet Take 20 mg by mouth daily.    Marland Kitchen gabapentin (NEURONTIN) 300 MG capsule  Take 300 mg by mouth at bedtime.     Marland Kitchen LORazepam (ATIVAN) 0.5 MG tablet Take 0.5 mg by mouth 2 (two) times daily.    . metFORMIN (GLUCOPHAGE) 500 MG tablet Take 500 mg by mouth 2 (two) times daily with a meal.    . metoprolol succinate (TOPROL-XL) 25 MG 24 hr tablet Take 25 mg by mouth 2 (two) times daily.     . Multiple Vitamins-Iron (MULTIVITAMINS WITH IRON) TABS tablet Take 1 tablet by mouth daily.    . nilotinib (TASIGNA)  200 MG capsule Take 2 capsules (400 mg total) by mouth every 12 (twelve) hours. Give on an empty stomach 1 hour before or 2 hours after meals. 120 capsule 5  . pantoprazole (PROTONIX) 40 MG tablet Take 40 mg by mouth daily.    . polyethylene glycol (MIRALAX / GLYCOLAX) packet Take 17 g by mouth daily.    . QUEtiapine (SEROQUEL) 400 MG tablet Take 300 mg by mouth at bedtime.     Marland Kitchen rOPINIRole (REQUIP) 2 MG tablet Take 2 mg by mouth at bedtime.    . rosuvastatin (CRESTOR) 20 MG tablet Take 20 mg by mouth at bedtime.     . sitaGLIPtin (JANUVIA) 100 MG tablet Take by mouth.    . Triamcinolone Acetonide (TRIAMCINOLONE 0.1 % CREAM : EUCERIN) CREA Apply topically.    Minus Liberty Tosylate (INGREZZA) 40 MG CAPS Take 2 capsules by mouth daily.     No current facility-administered medications for this visit.     OBJECTIVE: Vitals:   12/10/16 0831  BP: 120/74  Pulse: 60  Resp: 18  Temp: 98.1 F (36.7 C)     Body mass index is 31.52 kg/m.    ECOG FS:0 - Asymptomatic  General: Well-developed, well-nourished, no acute distress. Eyes: Pink conjunctiva, anicteric sclera. Lungs: Clear to auscultation bilaterally. Heart: Regular rate and rhythm. No rubs, murmurs, or gallops. Abdomen: Soft, nontender, nondistended. No organomegaly noted, normoactive bowel sounds. Musculoskeletal: No edema, cyanosis, or clubbing. Neuro: Alert, answering all questions appropriately. Cranial nerves grossly intact. Skin: No rashes or petechiae noted. Psych: Normal affect.  LAB RESULTS:  Lab Results  Component Value Date   NA 139 10/28/2016   K 3.5 10/28/2016   CL 106 10/28/2016   CO2 26 10/28/2016   GLUCOSE 163 (H) 10/28/2016   BUN 16 10/28/2016   CREATININE 1.69 (H) 10/28/2016   CALCIUM 8.9 10/28/2016   PROT 7.3 10/28/2016   ALBUMIN 3.7 10/28/2016   AST 25 10/28/2016   ALT 20 10/28/2016   ALKPHOS 61 10/28/2016   BILITOT 0.5 10/28/2016   GFRNONAA 44 (L) 10/28/2016   GFRAA 51 (L) 10/28/2016    Lab  Results  Component Value Date   WBC 2.0 (L) 10/28/2016   NEUTROABS 1.0 (L) 10/28/2016   HGB 11.8 (L) 10/28/2016   HCT 34.3 (L) 10/28/2016   MCV 90.6 10/28/2016   PLT 36 (L) 10/28/2016     STUDIES: No results found.  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 BCR-ABL is now increasing. Given patient's persistent pancytopenia, will discontinue Tasigna altogether. Okay to discontinue allopurinol. Patient will return to clinic for laboratory work next week and if his CBC is improving, will initiate Gleevec 400 mg daily. Return to clinic in 6 weeks with repeat laboratory work and further evaluation.  2. Pancytopenia: Discontinue Tasigna as above. Laboratory results from this encounter pending 3. Renal insufficiency: Patient's creatinine is mildly elevated today.  Approximately 30 minutes  was spent in discussion of which 50% was consultation.   Patient expressed understanding and was in agreement with this plan. He also understands that He can call clinic at any time with any questions, concerns, or complaints.    Lloyd Huger, MD 12/13/16 8:34 AM

## 2016-12-14 ENCOUNTER — Inpatient Hospital Stay: Payer: Medicare Other | Attending: Oncology

## 2016-12-14 ENCOUNTER — Other Ambulatory Visit: Payer: Self-pay | Admitting: Oncology

## 2016-12-14 DIAGNOSIS — C9210 Chronic myeloid leukemia, BCR/ABL-positive, not having achieved remission: Secondary | ICD-10-CM | POA: Insufficient documentation

## 2016-12-14 LAB — COMPREHENSIVE METABOLIC PANEL
ALT: 31 U/L (ref 17–63)
ANION GAP: 8 (ref 5–15)
AST: 32 U/L (ref 15–41)
Albumin: 4 g/dL (ref 3.5–5.0)
Alkaline Phosphatase: 58 U/L (ref 38–126)
BUN: 22 mg/dL — ABNORMAL HIGH (ref 6–20)
CHLORIDE: 106 mmol/L (ref 101–111)
CO2: 25 mmol/L (ref 22–32)
Calcium: 9 mg/dL (ref 8.9–10.3)
Creatinine, Ser: 1.79 mg/dL — ABNORMAL HIGH (ref 0.61–1.24)
GFR calc Af Amer: 47 mL/min — ABNORMAL LOW (ref >60)
GFR, EST NON AFRICAN AMERICAN: 41 mL/min — AB (ref >60)
Glucose, Bld: 111 mg/dL — ABNORMAL HIGH (ref 65–99)
POTASSIUM: 3.9 mmol/L (ref 3.5–5.1)
Sodium: 139 mmol/L (ref 135–145)
TOTAL PROTEIN: 7.4 g/dL (ref 6.5–8.1)
Total Bilirubin: 0.8 mg/dL (ref 0.3–1.2)

## 2016-12-14 LAB — CBC WITH DIFFERENTIAL/PLATELET
BASOS ABS: 0.1 10*3/uL (ref 0–0.1)
Basophils Relative: 4 %
EOS PCT: 2 %
Eosinophils Absolute: 0.1 10*3/uL (ref 0–0.7)
HCT: 34.4 % — ABNORMAL LOW (ref 40.0–52.0)
Hemoglobin: 11.8 g/dL — ABNORMAL LOW (ref 13.0–18.0)
LYMPHS PCT: 20 %
Lymphs Abs: 0.5 10*3/uL — ABNORMAL LOW (ref 1.0–3.6)
MCH: 30.9 pg (ref 26.0–34.0)
MCHC: 34.3 g/dL (ref 32.0–36.0)
MCV: 90.1 fL (ref 80.0–100.0)
MONO ABS: 0.4 10*3/uL (ref 0.2–1.0)
MONOS PCT: 14 %
Neutro Abs: 1.6 10*3/uL (ref 1.4–6.5)
Neutrophils Relative %: 60 %
PLATELETS: 90 10*3/uL — AB (ref 150–440)
RBC: 3.82 MIL/uL — ABNORMAL LOW (ref 4.40–5.90)
RDW: 15.3 % — AB (ref 11.5–14.5)
WBC: 2.7 10*3/uL — ABNORMAL LOW (ref 3.8–10.6)

## 2016-12-14 MED ORDER — IMATINIB MESYLATE 400 MG PO TABS: 400.0000 mg | ORAL_TABLET | Freq: Every day | ORAL | 5 refills | Status: DC

## 2016-12-15 ENCOUNTER — Telehealth: Payer: Self-pay | Admitting: Pharmacist

## 2016-12-15 DIAGNOSIS — C9210 Chronic myeloid leukemia, BCR/ABL-positive, not having achieved remission: Secondary | ICD-10-CM

## 2016-12-15 MED ORDER — IMATINIB MESYLATE 400 MG PO TABS: 400.0000 mg | ORAL_TABLET | Freq: Every day | ORAL | 5 refills | Status: DC

## 2016-12-15 NOTE — Telephone Encounter (Signed)
Oral Oncology Pharmacist Encounter  Received new prescription for Imatinib (Gleevec) for the treatment of CML, planned duration until disease progression or unacceptable drug toxicity.  CBC/CMP from 12/14/16 assessed, no lab abnormalities that would prevent the start of imatinib.  Current medication list in Epic reviewed, a few DDIs identified: Imatinib may increase the concentrations of the patients fluPHENAZine, quetiapine (Seroquel), fluoxetine (Prozac), metoprolol, and valbenazine (Ingrezza). Interaction is Risk Level C and should be just monitored.  (Of note, patient was previously on Tasigna and the increased concentration interaction with valbenazine is not new)  Prescription has been e-scribed to the [facility] for benefits analysis and approval.  Oral Oncology Clinic will continue to follow for insurance authorization, copayment issues, initial counseling and start date.  Nuala Alpha, PharmD, BCPS 12/15/2016 4:45 PM Oral Oncology Clinic [phone]

## 2016-12-16 MED ORDER — IMATINIB MESYLATE 400 MG PO TABS: 400.0000 mg | ORAL_TABLET | Freq: Every day | ORAL | 5 refills | Status: DC

## 2016-12-21 ENCOUNTER — Telehealth: Payer: Self-pay | Admitting: Oncology

## 2016-12-21 LAB — BCR-ABL1, CML/ALL, PCR, QUANT
b2a2 transcript: 0.9918 %
b3a2 transcript: 19.4788 %

## 2016-12-21 MED FILL — IMATINIB MESYLATE 400 MG TA: 400 | 30 days supply | Qty: 30 | Fill #0

## 2016-12-21 NOTE — Telephone Encounter (Signed)
Oral Chemotherapy Pharmacist Encounter  I spoke with patient caregiver [name] at the patient's facility [facility]). Reviewed patient's new oral chemotherapy medication: Imatinib (Gleevec) for the treatment of CML, planned duration until disease progression or unacceptable drug toxicity.  Pt is doing well.    Counseled caregiver on administration, dosing, side effects, safe handling, and monitoring. Patient will take 1 tablet (400 mg total) by mouth daily. Take with meals and large glass of water..  Side effects include but not limited to: N/V/D, fatigue, and rash.    Reviewed the importance of keeping a medication schedule and plan for any missed doses.  Caregiver voiced understanding and appreciation. All questions answered.  Caregiver knows to call the office with questions or concerns. Oral Oncology Clinic will continue to follow.  Patient has $0 copay and the medication will be shipped to the patient's Facility today.  Thank you,  Nuala Alpha, PharmD, BCPS 12/21/2016  12:34 PM Oral Oncology Clinic [phone]

## 2016-12-21 NOTE — Telephone Encounter (Signed)
Oral Oncology Patient Advocate Encounter  Received notification from [facility] that prior authorization for [place] is required.  PA submitted on CoverMyMeds Key [id number] Status is pending  Oral Oncology Clinic will continue to follow.   [place] Patient Advocate 12/21/2016 12:43 PM

## 2016-12-21 NOTE — Telephone Encounter (Signed)
.  Oral Oncology Patient Advocate Encounter  Prior Authorization for Lawrence Johnston has been approved with a 0.00 dollar co-pay thru [facility].     Oral Oncology Clinic will continue to follow.   [place] Patient Advocate 12/21/2016 12:49 PM

## 2016-12-28 ENCOUNTER — Telehealth: Payer: Self-pay | Admitting: Oncology

## 2016-12-28 NOTE — Telephone Encounter (Signed)
Left msg on V/M with appointment details.

## 2016-12-30 ENCOUNTER — Inpatient Hospital Stay: Payer: Medicare Other

## 2016-12-30 ENCOUNTER — Other Ambulatory Visit: Payer: Self-pay

## 2016-12-30 ENCOUNTER — Inpatient Hospital Stay (HOSPITAL_BASED_OUTPATIENT_CLINIC_OR_DEPARTMENT_OTHER): Payer: Medicare Other | Admitting: Oncology

## 2016-12-30 VITALS — BP 102/68 | HR 71 | Temp 98.4°F | Resp 18 | Wt 222.7 lb

## 2016-12-30 DIAGNOSIS — K219 Gastro-esophageal reflux disease without esophagitis: Secondary | ICD-10-CM | POA: Diagnosis not present

## 2016-12-30 DIAGNOSIS — G4730 Sleep apnea, unspecified: Secondary | ICD-10-CM

## 2016-12-30 DIAGNOSIS — N289 Disorder of kidney and ureter, unspecified: Secondary | ICD-10-CM

## 2016-12-30 DIAGNOSIS — E119 Type 2 diabetes mellitus without complications: Secondary | ICD-10-CM

## 2016-12-30 DIAGNOSIS — D61818 Other pancytopenia: Secondary | ICD-10-CM

## 2016-12-30 DIAGNOSIS — I4891 Unspecified atrial fibrillation: Secondary | ICD-10-CM

## 2016-12-30 DIAGNOSIS — D469 Myelodysplastic syndrome, unspecified: Secondary | ICD-10-CM

## 2016-12-30 DIAGNOSIS — E7800 Pure hypercholesterolemia, unspecified: Secondary | ICD-10-CM

## 2016-12-30 DIAGNOSIS — F329 Major depressive disorder, single episode, unspecified: Secondary | ICD-10-CM | POA: Diagnosis not present

## 2016-12-30 DIAGNOSIS — I10 Essential (primary) hypertension: Secondary | ICD-10-CM | POA: Diagnosis not present

## 2016-12-30 DIAGNOSIS — Z79899 Other long term (current) drug therapy: Secondary | ICD-10-CM | POA: Diagnosis not present

## 2016-12-30 DIAGNOSIS — C9210 Chronic myeloid leukemia, BCR/ABL-positive, not having achieved remission: Secondary | ICD-10-CM

## 2016-12-30 DIAGNOSIS — F209 Schizophrenia, unspecified: Secondary | ICD-10-CM

## 2016-12-30 LAB — COMPREHENSIVE METABOLIC PANEL
ALT: 22 U/L (ref 17–63)
ANION GAP: 7 (ref 5–15)
AST: 44 U/L — AB (ref 15–41)
Albumin: 3.3 g/dL — ABNORMAL LOW (ref 3.5–5.0)
Alkaline Phosphatase: 57 U/L (ref 38–126)
BILIRUBIN TOTAL: 0.7 mg/dL (ref 0.3–1.2)
BUN: 14 mg/dL (ref 6–20)
CO2: 29 mmol/L (ref 22–32)
Calcium: 8.7 mg/dL — ABNORMAL LOW (ref 8.9–10.3)
Chloride: 103 mmol/L (ref 101–111)
Creatinine, Ser: 1.57 mg/dL — ABNORMAL HIGH (ref 0.61–1.24)
GFR, EST AFRICAN AMERICAN: 55 mL/min — AB (ref >60)
GFR, EST NON AFRICAN AMERICAN: 48 mL/min — AB (ref >60)
GLUCOSE: 128 mg/dL — AB (ref 65–99)
POTASSIUM: 3.9 mmol/L (ref 3.5–5.1)
Sodium: 139 mmol/L (ref 135–145)
TOTAL PROTEIN: 6.6 g/dL (ref 6.5–8.1)

## 2016-12-30 LAB — CBC WITH DIFFERENTIAL/PLATELET
Basophils Absolute: 0.1 10*3/uL (ref 0–0.1)
Basophils Relative: 3 %
EOS PCT: 7 %
Eosinophils Absolute: 0.3 10*3/uL (ref 0–0.7)
HEMATOCRIT: 34.2 % — AB (ref 40.0–52.0)
Hemoglobin: 11.6 g/dL — ABNORMAL LOW (ref 13.0–18.0)
LYMPHS PCT: 20 %
Lymphs Abs: 0.8 10*3/uL — ABNORMAL LOW (ref 1.0–3.6)
MCH: 30.3 pg (ref 26.0–34.0)
MCHC: 33.9 g/dL (ref 32.0–36.0)
MCV: 89.6 fL (ref 80.0–100.0)
MONO ABS: 0.4 10*3/uL (ref 0.2–1.0)
MONOS PCT: 11 %
NEUTROS ABS: 2.4 10*3/uL (ref 1.4–6.5)
Neutrophils Relative %: 59 %
PLATELETS: 103 10*3/uL — AB (ref 150–440)
RBC: 3.81 MIL/uL — ABNORMAL LOW (ref 4.40–5.90)
RDW: 15.2 % — AB (ref 11.5–14.5)
WBC: 4 10*3/uL (ref 3.8–10.6)

## 2016-12-30 NOTE — Progress Notes (Signed)
[place]  [phone] [phone]  ID: Jackelyn Hoehn OB: 04/19/1961  MR#: [id number]  NKN#:[id number]  Patient Care Team: Idelle Crouch, MD as PCP - General (Internal Medicine)  CHIEF COMPLAINT: CML  INTERVAL HISTORY: Patient returns to clinic today as an add-on after presenting to his primary care physician with possible side effects to [place]. His caretaker reports that after starting [place] he was able to continue conversation and mentating well, but had no control over his body and would slumped or fall over without warning. He also had difficulty walking and performing other simple tasks over this time frame. His symptoms resolved one day after discontinuing Gleevec. Currently he feels well and is back to his baseline. He does not complain of fevers, night sweats, or weight loss. He denies any chest pain or shortness of breath. He has no nausea, vomiting, constipation, or diarrhea. He has no urinary complaints. Patient offers no specific complaints today.  REVIEW OF SYSTEMS:   Review of Systems  Constitutional: Negative.  Negative for chills, diaphoresis, fever, malaise/fatigue and weight loss.  HENT: Negative for congestion.   Respiratory: Negative.  Negative for cough and shortness of breath.   Cardiovascular: Negative.  Negative for chest pain and leg swelling.  Gastrointestinal: Negative.  Negative for abdominal pain.  Genitourinary: Negative.   Musculoskeletal: Negative.   Skin: Negative.  Negative for rash.  Neurological: Negative.  Negative for sensory change and weakness.  Endo/Heme/Allergies: Negative.  Does not bruise/bleed easily.  Psychiatric/Behavioral: Negative.  Negative for depression. The patient is not nervous/anxious.     As per HPI. Otherwise, a complete review of systems is negative.  PAST MEDICAL HISTORY: Past Medical History:  Diagnosis Date  . Arrhythmia    Atrial Fibrillation; Atrial Flutter w/RVR; SVT  .  Depression   . Diabetes ([place])   . Dysrhythmia   . GERD (gastroesophageal reflux disease)   . Hypercholesteremia   . Hypertension   . Schizophrenia ([place])   . Sleep apnea   . Tremor     PAST SURGICAL HISTORY: Past Surgical History:  Procedure Laterality Date  . CARDIOVERSION     x2, a-fib  . COLONOSCOPY WITH PROPOFOL N/A 02/25/2015   Procedure: COLONOSCOPY WITH PROPOFOL;  Surgeon: Josefine Class, MD;  Location: [facility] ENDOSCOPY;  Service: Endoscopy;  Laterality: N/A;  . COLONOSCOPY WITH PROPOFOL N/A 04/12/2015   Procedure: COLONOSCOPY WITH PROPOFOL;  Surgeon: Josefine Class, MD;  Location: [facility] ENDOSCOPY;  Service: Endoscopy;  Laterality: N/A;    FAMILY HISTORY: Reviewed and unchanged. No reported history of malignancy or chronic disease.  ADVANCED DIRECTIVES (Y/N):  N  HEALTH MAINTENANCE: Social History  Substance Use Topics  . Smoking status: Never Smoker  . Smokeless tobacco: Never Used  . Alcohol use No     Colonoscopy:  PAP:  Bone density:  Lipid panel:  No Known Allergies  Current Outpatient Prescriptions  Medication Sig Dispense Refill  . allopurinol (ZYLOPRIM) 300 MG tablet Take 1 tablet (300 mg total) by mouth daily. Start treatment 1 week prior to initiating Tasigna. 30 tablet 2  . benztropine (COGENTIN) 1 MG tablet Take 1 mg by mouth 2 (two) times daily.     . cloNIDine (CATAPRES) 0.1 MG tablet Take 0.1 mg by mouth daily as needed (for shaking).    Marland Kitchen divalproex (DEPAKOTE ER) 500 MG 24 hr tablet Take 2,000 mg by mouth at bedtime.    . docusate sodium (COLACE) 100 MG capsule TAKE (1) CAPSULE BY MOUTH ONCE  DAILY.    Marland Kitchen FLUoxetine (PROZAC) 20 MG tablet Take 20 mg by mouth daily.    . fluPHENAZine (PROLIXIN) 5 MG tablet Take 5 mg by mouth 2 (two) times daily. In the evening and at bedtime    . furosemide (LASIX) 20 MG tablet Take 20 mg by mouth daily.    Marland Kitchen gabapentin (NEURONTIN) 300 MG capsule Take 300 mg by mouth at bedtime.     Marland Kitchen LORazepam (ATIVAN) 0.5  MG tablet Take 0.5 mg by mouth 2 (two) times daily.    . metFORMIN (GLUCOPHAGE) 500 MG tablet Take 500 mg by mouth 2 (two) times daily with a meal.    . metoprolol succinate (TOPROL-XL) 25 MG 24 hr tablet Take 25 mg by mouth 2 (two) times daily.     . Multiple Vitamins-Iron (MULTIVITAMINS WITH IRON) TABS tablet Take 1 tablet by mouth daily.    . pantoprazole (PROTONIX) 40 MG tablet Take 40 mg by mouth daily.    . polyethylene glycol (MIRALAX / GLYCOLAX) packet Take 17 g by mouth daily.    . QUEtiapine (SEROQUEL) 400 MG tablet Take 300 mg by mouth at bedtime.     Marland Kitchen rOPINIRole (REQUIP) 2 MG tablet Take 2 mg by mouth at bedtime.    . rosuvastatin (CRESTOR) 20 MG tablet Take 20 mg by mouth at bedtime.     . sitaGLIPtin (JANUVIA) 100 MG tablet Take by mouth.    . Triamcinolone Acetonide (TRIAMCINOLONE 0.1 % CREAM : EUCERIN) CREA Apply topically.    [place] Tosylate (INGREZZA) 40 MG CAPS Take 2 capsules by mouth daily.    Marland Kitchen imatinib (GLEEVEC) 400 MG tablet Take 1 tablet (400 mg total) by mouth daily. Take with meals and large glass of water.Caution:Chemotherapy. (Patient not taking: Reported on 12/30/2016) 30 tablet 5   No current facility-administered medications for this visit.     OBJECTIVE: Vitals:   12/30/16 1149  BP: 102/68  Pulse: 71  Resp: 18  Temp: 98.4 F (36.9 C)     Body mass index is 31.06 kg/m.    ECOG FS:0 - Asymptomatic  General: Well-developed, well-nourished, no acute distress. Eyes: Pink conjunctiva, anicteric sclera. Lungs: Clear to auscultation bilaterally. Heart: Regular rate and rhythm. No rubs, murmurs, or gallops. Abdomen: Soft, nontender, nondistended. No organomegaly noted, normoactive bowel sounds. Musculoskeletal: No edema, cyanosis, or clubbing. Neuro: Alert, answering all questions appropriately. Cranial nerves grossly intact. Skin: No rashes or petechiae noted. Psych: Normal affect.  LAB RESULTS:  Lab Results  Component Value Date   NA 139  12/30/2016   K 3.9 12/30/2016   CL 103 12/30/2016   CO2 29 12/30/2016   GLUCOSE 128 (H) 12/30/2016   BUN 14 12/30/2016   CREATININE 1.57 (H) 12/30/2016   CALCIUM 8.7 (L) 12/30/2016   PROT 6.6 12/30/2016   ALBUMIN 3.3 (L) 12/30/2016   AST 44 (H) 12/30/2016   ALT 22 12/30/2016   ALKPHOS 57 12/30/2016   BILITOT 0.7 12/30/2016   GFRNONAA 48 (L) 12/30/2016   GFRAA 55 (L) 12/30/2016    Lab Results  Component Value Date   WBC 4.0 12/30/2016   NEUTROABS 2.4 12/30/2016   HGB 11.6 (L) 12/30/2016   HCT 34.2 (L) 12/30/2016   MCV 89.6 12/30/2016   PLT 103 (L) 12/30/2016     STUDIES: No results found.  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 BCR-ABL is now increasing. Given patient's persistent pancytopenia, Tasigna was discontinued. Okay to discontinue  allopurinol. Although patient's symptoms occurred when starting Gleevec, these are not typical side effects directly related to treatment. It is possible they are secondary to a drug interaction with one of the multiple medications that he is taking (Prozac, Seroquel, Ingrezza, and/or Flupenazine all possibilities.)  Although his symptoms resolved 24 hours after discontinuing, which does not make sense biochemically. Will rechallenge with Gleevec in the next week. If his symptoms recur will discontinue altogether. If patient tolerates Gleevec he will return to clinic in 6 weeks for further evaluation. If Gleevec is discontinued, will initiate third line treatment.  2. Pancytopenia: Significantly improved.  Discontinue Tasigna as above.  3. Renal insufficiency: Patient's creatinine is mildly elevated today.  Approximately 30 minutes was spent in discussion of which 50% was consultation.   Patient expressed understanding and was in agreement with this plan. He also understands that He can call clinic at any time with any questions, concerns, or complaints.    Lloyd Huger, MD 12/30/16 1:55 PM

## 2017-01-13 ENCOUNTER — Telehealth: Payer: Self-pay | Admitting: Pharmacist

## 2017-01-13 MED FILL — IMATINIB MESYLATE 400 MG TA: 400 | 30 days supply | Qty: 30 | Fill #1

## 2017-01-13 NOTE — Telephone Encounter (Signed)
Oral Chemotherapy Pharmacist Encounter  Called patient's facility to check in on him and see how his [place] rechallenge went. Unable to LVM. Will attempt a second call.  Darl Pikes, PharmD, BCPS Hematology/Oncology Clinical Pharmacist [facility] [phone]  01/13/2017 2:46 PM

## 2017-01-14 NOTE — Telephone Encounter (Signed)
Oral Chemotherapy Pharmacist Encounter   Attempted to call patient again. No answer at the facility, unable to LVM.   Thank you,  Darl Pikes, PharmD, BCPS Hematology/Oncology Clinical Pharmacist [facility] [phone]  01/14/2017 3:40 PM

## 2017-01-20 ENCOUNTER — Other Ambulatory Visit: Payer: Self-pay | Admitting: Pharmacist

## 2017-02-10 ENCOUNTER — Encounter: Payer: Self-pay | Admitting: Emergency Medicine

## 2017-02-10 ENCOUNTER — Inpatient Hospital Stay
Admission: EM | Admit: 2017-02-10 | Discharge: 2017-02-19 | DRG: 809 | Disposition: A | Discharge status: Skilled Nursing Facility | Payer: Medicare Other | Attending: Internal Medicine | Admitting: Internal Medicine

## 2017-02-10 ENCOUNTER — Other Ambulatory Visit: Payer: Medicare Other

## 2017-02-10 ENCOUNTER — Ambulatory Visit: Payer: Medicare Other | Admitting: Oncology

## 2017-02-10 ENCOUNTER — Emergency Department: Payer: Medicare Other

## 2017-02-10 ENCOUNTER — Inpatient Hospital Stay: Payer: Medicare Other

## 2017-02-10 DIAGNOSIS — Z915 Personal history of self-harm: Secondary | ICD-10-CM

## 2017-02-10 DIAGNOSIS — I129 Hypertensive chronic kidney disease with stage 1 through stage 4 chronic kidney disease, or unspecified chronic kidney disease: Secondary | ICD-10-CM | POA: Diagnosis present

## 2017-02-10 DIAGNOSIS — I484 Atypical atrial flutter: Secondary | ICD-10-CM | POA: Diagnosis present

## 2017-02-10 DIAGNOSIS — E8809 Other disorders of plasma-protein metabolism, not elsewhere classified: Secondary | ICD-10-CM | POA: Diagnosis present

## 2017-02-10 DIAGNOSIS — G2401 Drug induced subacute dyskinesia: Secondary | ICD-10-CM | POA: Diagnosis present

## 2017-02-10 DIAGNOSIS — T451X5A Adverse effect of antineoplastic and immunosuppressive drugs, initial encounter: Secondary | ICD-10-CM | POA: Diagnosis present

## 2017-02-10 DIAGNOSIS — N183 Chronic kidney disease, stage 3 (moderate): Secondary | ICD-10-CM | POA: Diagnosis present

## 2017-02-10 DIAGNOSIS — E7800 Pure hypercholesterolemia, unspecified: Secondary | ICD-10-CM | POA: Diagnosis present

## 2017-02-10 DIAGNOSIS — E860 Dehydration: Secondary | ICD-10-CM | POA: Diagnosis present

## 2017-02-10 DIAGNOSIS — N179 Acute kidney failure, unspecified: Secondary | ICD-10-CM | POA: Diagnosis present

## 2017-02-10 DIAGNOSIS — F329 Major depressive disorder, single episode, unspecified: Secondary | ICD-10-CM | POA: Diagnosis present

## 2017-02-10 DIAGNOSIS — E1122 Type 2 diabetes mellitus with diabetic chronic kidney disease: Secondary | ICD-10-CM | POA: Diagnosis present

## 2017-02-10 DIAGNOSIS — R531 Weakness: Secondary | ICD-10-CM | POA: Diagnosis present

## 2017-02-10 DIAGNOSIS — F209 Schizophrenia, unspecified: Secondary | ICD-10-CM | POA: Diagnosis not present

## 2017-02-10 DIAGNOSIS — K560 Paralytic ileus: Secondary | ICD-10-CM | POA: Diagnosis present

## 2017-02-10 DIAGNOSIS — C9210 Chronic myeloid leukemia, BCR/ABL-positive, not having achieved remission: Secondary | ICD-10-CM | POA: Diagnosis present

## 2017-02-10 DIAGNOSIS — D61810 Antineoplastic chemotherapy induced pancytopenia: Secondary | ICD-10-CM | POA: Diagnosis present

## 2017-02-10 DIAGNOSIS — I4581 Long QT syndrome: Secondary | ICD-10-CM | POA: Diagnosis present

## 2017-02-10 DIAGNOSIS — R600 Localized edema: Secondary | ICD-10-CM | POA: Diagnosis not present

## 2017-02-10 DIAGNOSIS — R0989 Other specified symptoms and signs involving the circulatory and respiratory systems: Secondary | ICD-10-CM

## 2017-02-10 DIAGNOSIS — I10 Essential (primary) hypertension: Secondary | ICD-10-CM | POA: Diagnosis not present

## 2017-02-10 DIAGNOSIS — G4733 Obstructive sleep apnea (adult) (pediatric): Secondary | ICD-10-CM | POA: Diagnosis present

## 2017-02-10 DIAGNOSIS — R188 Other ascites: Secondary | ICD-10-CM | POA: Diagnosis present

## 2017-02-10 DIAGNOSIS — K219 Gastro-esophageal reflux disease without esophagitis: Secondary | ICD-10-CM | POA: Diagnosis present

## 2017-02-10 DIAGNOSIS — F203 Undifferentiated schizophrenia: Secondary | ICD-10-CM | POA: Diagnosis not present

## 2017-02-10 DIAGNOSIS — K567 Ileus, unspecified: Secondary | ICD-10-CM | POA: Diagnosis not present

## 2017-02-10 DIAGNOSIS — I959 Hypotension, unspecified: Secondary | ICD-10-CM

## 2017-02-10 DIAGNOSIS — R42 Dizziness and giddiness: Secondary | ICD-10-CM

## 2017-02-10 DIAGNOSIS — E119 Type 2 diabetes mellitus without complications: Secondary | ICD-10-CM | POA: Diagnosis not present

## 2017-02-10 DIAGNOSIS — Y93E1 Activity, personal bathing and showering: Secondary | ICD-10-CM | POA: Diagnosis not present

## 2017-02-10 DIAGNOSIS — I480 Paroxysmal atrial fibrillation: Secondary | ICD-10-CM | POA: Diagnosis present

## 2017-02-10 DIAGNOSIS — I499 Cardiac arrhythmia, unspecified: Secondary | ICD-10-CM | POA: Diagnosis not present

## 2017-02-10 DIAGNOSIS — R296 Repeated falls: Secondary | ICD-10-CM | POA: Diagnosis present

## 2017-02-10 DIAGNOSIS — F259 Schizoaffective disorder, unspecified: Secondary | ICD-10-CM | POA: Diagnosis present

## 2017-02-10 DIAGNOSIS — W182XXA Fall in (into) shower or empty bathtub, initial encounter: Secondary | ICD-10-CM | POA: Diagnosis present

## 2017-02-10 DIAGNOSIS — I808 Phlebitis and thrombophlebitis of other sites: Secondary | ICD-10-CM | POA: Diagnosis not present

## 2017-02-10 DIAGNOSIS — E861 Hypovolemia: Secondary | ICD-10-CM | POA: Diagnosis present

## 2017-02-10 DIAGNOSIS — E872 Acidosis, unspecified: Secondary | ICD-10-CM

## 2017-02-10 DIAGNOSIS — J9811 Atelectasis: Secondary | ICD-10-CM | POA: Diagnosis present

## 2017-02-10 DIAGNOSIS — Z0189 Encounter for other specified special examinations: Secondary | ICD-10-CM

## 2017-02-10 DIAGNOSIS — D62 Acute posthemorrhagic anemia: Secondary | ICD-10-CM

## 2017-02-10 DIAGNOSIS — Z7984 Long term (current) use of oral hypoglycemic drugs: Secondary | ICD-10-CM | POA: Diagnosis not present

## 2017-02-10 DIAGNOSIS — Z79899 Other long term (current) drug therapy: Secondary | ICD-10-CM

## 2017-02-10 DIAGNOSIS — R140 Abdominal distension (gaseous): Secondary | ICD-10-CM | POA: Diagnosis not present

## 2017-02-10 DIAGNOSIS — R5383 Other fatigue: Secondary | ICD-10-CM | POA: Diagnosis not present

## 2017-02-10 DIAGNOSIS — D469 Myelodysplastic syndrome, unspecified: Secondary | ICD-10-CM | POA: Diagnosis not present

## 2017-02-10 DIAGNOSIS — K5900 Constipation, unspecified: Secondary | ICD-10-CM | POA: Diagnosis present

## 2017-02-10 DIAGNOSIS — I471 Supraventricular tachycardia: Secondary | ICD-10-CM | POA: Diagnosis present

## 2017-02-10 DIAGNOSIS — D61818 Other pancytopenia: Secondary | ICD-10-CM | POA: Diagnosis not present

## 2017-02-10 DIAGNOSIS — D649 Anemia, unspecified: Secondary | ICD-10-CM | POA: Diagnosis present

## 2017-02-10 DIAGNOSIS — M79602 Pain in left arm: Secondary | ICD-10-CM | POA: Diagnosis not present

## 2017-02-10 DIAGNOSIS — R339 Retention of urine, unspecified: Secondary | ICD-10-CM | POA: Diagnosis not present

## 2017-02-10 DIAGNOSIS — S20211A Contusion of right front wall of thorax, initial encounter: Secondary | ICD-10-CM | POA: Diagnosis present

## 2017-02-10 DIAGNOSIS — G4730 Sleep apnea, unspecified: Secondary | ICD-10-CM | POA: Diagnosis not present

## 2017-02-10 DIAGNOSIS — T43505A Adverse effect of unspecified antipsychotics and neuroleptics, initial encounter: Secondary | ICD-10-CM

## 2017-02-10 DIAGNOSIS — I4891 Unspecified atrial fibrillation: Secondary | ICD-10-CM | POA: Diagnosis not present

## 2017-02-10 DIAGNOSIS — K625 Hemorrhage of anus and rectum: Secondary | ICD-10-CM

## 2017-02-10 LAB — GLUCOSE, CAPILLARY
Glucose-Capillary: 108 mg/dL — ABNORMAL HIGH (ref 65–99)
Glucose-Capillary: 97 mg/dL (ref 65–99)
Glucose-Capillary: 89 mg/dL (ref 65–99)
Glucose-Capillary: 119 mg/dL — ABNORMAL HIGH (ref 65–99)
Glucose-Capillary: 109 mg/dL — ABNORMAL HIGH (ref 65–99)

## 2017-02-10 LAB — CBC WITH DIFFERENTIAL/PLATELET
BASOS ABS: 0 10*3/uL (ref 0–0.1)
Basophils Relative: 1 %
EOS PCT: 3 %
Eosinophils Absolute: 0.1 10*3/uL (ref 0–0.7)
HEMATOCRIT: 28 % — AB (ref 40.0–52.0)
Hemoglobin: 9.4 g/dL — ABNORMAL LOW (ref 13.0–18.0)
LYMPHS PCT: 17 %
Lymphs Abs: 0.5 10*3/uL — ABNORMAL LOW (ref 1.0–3.6)
MCH: 32 pg (ref 26.0–34.0)
MCHC: 33.6 g/dL (ref 32.0–36.0)
MCV: 95.3 fL (ref 80.0–100.0)
MONO ABS: 0.5 10*3/uL (ref 0.2–1.0)
Monocytes Relative: 18 %
NEUTROS ABS: 1.7 10*3/uL (ref 1.4–6.5)
Neutrophils Relative %: 61 %
PLATELETS: 60 10*3/uL — AB (ref 150–440)
RBC: 2.94 MIL/uL — ABNORMAL LOW (ref 4.40–5.90)
RDW: 20.1 % — AB (ref 11.5–14.5)
WBC: 2.7 10*3/uL — AB (ref 3.8–10.6)

## 2017-02-10 LAB — BASIC METABOLIC PANEL
Anion gap: 13 (ref 5–15)
BUN: 43 mg/dL — AB (ref 6–20)
CO2: 19 mmol/L — ABNORMAL LOW (ref 22–32)
CREATININE: 4.04 mg/dL — AB (ref 0.61–1.24)
Calcium: 7.7 mg/dL — ABNORMAL LOW (ref 8.9–10.3)
Chloride: 108 mmol/L (ref 101–111)
GFR calc Af Amer: 18 mL/min — ABNORMAL LOW (ref >60)
GFR, EST NON AFRICAN AMERICAN: 15 mL/min — AB (ref >60)
GLUCOSE: 141 mg/dL — AB (ref 65–99)
Potassium: 4.1 mmol/L (ref 3.5–5.1)
SODIUM: 140 mmol/L (ref 135–145)

## 2017-02-10 LAB — HEPATIC FUNCTION PANEL
ALBUMIN: 2.7 g/dL — AB (ref 3.5–5.0)
ALT: 20 U/L (ref 17–63)
AST: 89 U/L — ABNORMAL HIGH (ref 15–41)
Alkaline Phosphatase: 50 U/L (ref 38–126)
BILIRUBIN INDIRECT: 1 mg/dL — AB (ref 0.3–0.9)
BILIRUBIN TOTAL: 1.2 mg/dL (ref 0.3–1.2)
Bilirubin, Direct: 0.2 mg/dL (ref 0.1–0.5)
TOTAL PROTEIN: 5.2 g/dL — AB (ref 6.5–8.1)

## 2017-02-10 LAB — URINALYSIS, COMPLETE (UACMP) WITH MICROSCOPIC
Bacteria, UA: NONE SEEN
Bilirubin Urine: NEGATIVE
Glucose, UA: NEGATIVE mg/dL
Hgb urine dipstick: NEGATIVE
Ketones, ur: NEGATIVE mg/dL
Leukocytes, UA: NEGATIVE
Nitrite: NEGATIVE
Protein, ur: NEGATIVE mg/dL
Specific Gravity, Urine: 1.017 (ref 1.005–1.030)
pH: 5 (ref 5.0–8.0)

## 2017-02-10 LAB — LACTIC ACID, PLASMA
Lactic Acid, Venous: 5 mmol/L (ref 0.5–1.9)
Lactic Acid, Venous: 1.8 mmol/L (ref 0.5–1.9)

## 2017-02-10 LAB — CBC
HCT: 20.9 % — ABNORMAL LOW (ref 40.0–52.0)
HEMOGLOBIN: 6.9 g/dL — AB (ref 13.0–18.0)
MCH: 33.1 pg (ref 26.0–34.0)
MCHC: 32.9 g/dL (ref 32.0–36.0)
MCV: 100.6 fL — ABNORMAL HIGH (ref 80.0–100.0)
PLATELETS: 59 10*3/uL — AB (ref 150–440)
RBC: 2.08 MIL/uL — AB (ref 4.40–5.90)
RDW: 20 % — AB (ref 11.5–14.5)
WBC: 2.3 10*3/uL — ABNORMAL LOW (ref 3.8–10.6)

## 2017-02-10 LAB — AMMONIA: Ammonia: 14 umol/L (ref 9–35)

## 2017-02-10 LAB — TSH: TSH: 2.208 u[IU]/mL (ref 0.350–4.500)

## 2017-02-10 LAB — PREPARE RBC (CROSSMATCH)

## 2017-02-10 LAB — TROPONIN I

## 2017-02-10 LAB — ABO/RH: ABO/RH(D): O POS

## 2017-02-10 LAB — MRSA PCR SCREENING: MRSA by PCR: NEGATIVE

## 2017-02-10 LAB — VALPROIC ACID LEVEL: VALPROIC ACID LVL: 54 ug/mL (ref 50.0–100.0)

## 2017-02-10 MED ORDER — ROSUVASTATIN CALCIUM 10 MG PO TABS
20.0000 mg | ORAL_TABLET | Freq: Every day | ORAL | Status: DC
  Administered 2017-02-10 – 2017-02-18 (×8): 20 mg via ORAL
  Filled 2017-02-10 (×8): qty 2

## 2017-02-10 MED ORDER — GABAPENTIN 300 MG PO CAPS
300.0000 mg | ORAL_CAPSULE | Freq: Every day | ORAL | Status: DC
  Administered 2017-02-10 – 2017-02-18 (×8): 300 mg via ORAL
  Filled 2017-02-10 (×8): qty 1

## 2017-02-10 MED ORDER — QUETIAPINE FUMARATE 200 MG PO TABS: 300.0000 mg | ORAL_TABLET | Freq: Every day | ORAL | Status: DC

## 2017-02-10 MED ORDER — METOPROLOL SUCCINATE ER 50 MG PO TB24: 25.0000 mg | ORAL_TABLET | Freq: Two times a day (BID) | ORAL | Status: DC

## 2017-02-10 MED ORDER — LORAZEPAM 0.5 MG PO TABS
0.5000 mg | ORAL_TABLET | Freq: Two times a day (BID) | ORAL | Status: DC
  Administered 2017-02-10 – 2017-02-11 (×3): 0.5 mg via ORAL
  Filled 2017-02-10 (×3): qty 1

## 2017-02-10 MED ORDER — SODIUM CHLORIDE 0.9 % IV SOLN
INTRAVENOUS | Status: DC
  Administered 2017-02-10: 06:00:00 via INTRAVENOUS

## 2017-02-10 MED ORDER — VALBENAZINE TOSYLATE 80 MG PO CAPS: 80.0000 mg | ORAL_CAPSULE | Freq: Every day | ORAL | Status: DC

## 2017-02-10 MED ORDER — SODIUM CHLORIDE 0.9 % IV BOLUS (SEPSIS)
1000.0000 mL | Freq: Once | INTRAVENOUS | Status: AC
  Administered 2017-02-10: 1000 mL via INTRAVENOUS

## 2017-02-10 MED ORDER — INSULIN ASPART 100 UNIT/ML ~~LOC~~ SOLN
0.0000 [IU] | Freq: Three times a day (TID) | SUBCUTANEOUS | Status: DC
  Administered 2017-02-12 – 2017-02-16 (×5): 1 [IU] via SUBCUTANEOUS
  Administered 2017-02-16: 2 [IU] via SUBCUTANEOUS
  Administered 2017-02-17 – 2017-02-19 (×4): 1 [IU] via SUBCUTANEOUS
  Filled 2017-02-10 (×10): qty 1

## 2017-02-10 MED ORDER — FLUOXETINE HCL 20 MG PO CAPS
20.0000 mg | ORAL_CAPSULE | Freq: Every day | ORAL | Status: DC
Start: 2017-02-10 — End: 2017-02-19
  Administered 2017-02-10 – 2017-02-19 (×9): 20 mg via ORAL
  Filled 2017-02-10 (×10): qty 1

## 2017-02-10 MED ORDER — TAB-A-VITE/IRON PO TABS
1.0000 | ORAL_TABLET | Freq: Every day | ORAL | Status: DC
  Administered 2017-02-10 – 2017-02-19 (×9): 1 via ORAL
  Filled 2017-02-10 (×10): qty 1

## 2017-02-10 MED ORDER — SENNOSIDES-DOCUSATE SODIUM 8.6-50 MG PO TABS
1.0000 | ORAL_TABLET | Freq: Two times a day (BID) | ORAL | Status: DC
  Administered 2017-02-10 – 2017-02-11 (×4): 1 via ORAL
  Filled 2017-02-10 (×4): qty 1

## 2017-02-10 MED ORDER — PANTOPRAZOLE SODIUM 40 MG PO TBEC
40.0000 mg | DELAYED_RELEASE_TABLET | Freq: Every day | ORAL | Status: DC
  Administered 2017-02-10 – 2017-02-19 (×9): 40 mg via ORAL
  Filled 2017-02-10 (×9): qty 1

## 2017-02-10 MED ORDER — BENZTROPINE MESYLATE 0.5 MG PO TABS
0.5000 mg | ORAL_TABLET | Freq: Two times a day (BID) | ORAL | Status: DC
  Administered 2017-02-10: 0.5 mg via ORAL
  Filled 2017-02-10: qty 1

## 2017-02-10 MED ORDER — FUROSEMIDE 20 MG PO TABS: 20.0000 mg | ORAL_TABLET | Freq: Every day | ORAL | Status: DC

## 2017-02-10 MED ORDER — POLYETHYLENE GLYCOL 3350 17 G PO PACK
17.0000 g | PACK | Freq: Every day | ORAL | Status: DC
  Administered 2017-02-10 – 2017-02-18 (×6): 17 g via ORAL
  Filled 2017-02-10 (×8): qty 1

## 2017-02-10 MED ORDER — OLANZAPINE 5 MG PO TABS
5.0000 mg | ORAL_TABLET | Freq: Every day | ORAL | Status: DC
  Administered 2017-02-10: 5 mg via ORAL
  Filled 2017-02-10: qty 1

## 2017-02-10 MED ORDER — DIVALPROEX SODIUM ER 500 MG PO TB24: 2000.0000 mg | ORAL_TABLET | Freq: Every day | ORAL | Status: DC

## 2017-02-10 MED ORDER — FLUPHENAZINE HCL 5 MG PO TABS
5.0000 mg | ORAL_TABLET | Freq: Two times a day (BID) | ORAL | Status: DC
  Filled 2017-02-10: qty 1

## 2017-02-10 MED ORDER — INSULIN ASPART 100 UNIT/ML ~~LOC~~ SOLN
0.0000 [IU] | Freq: Every day | SUBCUTANEOUS | Status: DC
Start: 2017-02-10 — End: 2017-02-19

## 2017-02-10 MED ORDER — DOCUSATE SODIUM 100 MG PO CAPS
100.0000 mg | ORAL_CAPSULE | Freq: Two times a day (BID) | ORAL | Status: DC
  Administered 2017-02-10 – 2017-02-17 (×11): 100 mg via ORAL
  Filled 2017-02-10 (×11): qty 1

## 2017-02-10 MED ORDER — ONDANSETRON HCL 4 MG PO TABS: 4.0000 mg | ORAL_TABLET | Freq: Four times a day (QID) | ORAL | Status: DC | PRN

## 2017-02-10 MED ORDER — SODIUM CHLORIDE 0.9 % IV SOLN
Freq: Once | INTRAVENOUS | Status: AC
  Administered 2017-02-10: 05:00:00 via INTRAVENOUS

## 2017-02-10 MED ORDER — ONDANSETRON HCL 4 MG/2ML IJ SOLN: 4.0000 mg | Freq: Four times a day (QID) | INTRAMUSCULAR | Status: DC | PRN

## 2017-02-10 MED ORDER — LINAGLIPTIN 5 MG PO TABS
5.0000 mg | ORAL_TABLET | Freq: Every day | ORAL | Status: DC
  Administered 2017-02-10: 5 mg via ORAL
  Filled 2017-02-10: qty 1

## 2017-02-10 MED ORDER — ACETAMINOPHEN 325 MG PO TABS
650.0000 mg | ORAL_TABLET | Freq: Four times a day (QID) | ORAL | Status: DC | PRN
  Administered 2017-02-16: 650 mg via ORAL
  Filled 2017-02-10: qty 2

## 2017-02-10 MED ORDER — SODIUM CHLORIDE 0.9 % IV SOLN
0.0000 ug/min | INTRAVENOUS | Status: DC
  Administered 2017-02-10: 10 ug/min via INTRAVENOUS
  Filled 2017-02-10: qty 10

## 2017-02-10 MED ORDER — ACETAMINOPHEN 650 MG RE SUPP: 650.0000 mg | Freq: Four times a day (QID) | RECTAL | Status: DC | PRN

## 2017-02-10 MED ORDER — ROPINIROLE HCL 1 MG PO TABS
2.0000 mg | ORAL_TABLET | Freq: Every day | ORAL | Status: DC
  Administered 2017-02-10 – 2017-02-18 (×8): 2 mg via ORAL
  Filled 2017-02-10 (×8): qty 2

## 2017-02-10 MED ORDER — QUETIAPINE FUMARATE 25 MG PO TABS
100.0000 mg | ORAL_TABLET | Freq: Every day | ORAL | Status: DC
  Administered 2017-02-10 – 2017-02-11 (×2): 100 mg via ORAL
  Filled 2017-02-10: qty 1
  Filled 2017-02-10: qty 4

## 2017-02-10 NOTE — Progress Notes (Signed)
Admitted 56yrs old male from ED, s/p symptomatic anemia to room 16, pt is a/ox4 but drowsy, hard to keep awake more then few minutes at a time. Pt unable to answer much questions on his admission hx. Pt stated" I am very tired". Pt oriented to room and call light. Bed to lowest position and call bell at reach.

## 2017-02-10 NOTE — Progress Notes (Signed)
[place] [facility] [place] NAME: Lawrence Johnston    MR#:  [id number]  DATE OF BIRTH:  May 27, 1960  SUBJECTIVE:  CHIEF COMPLAINT:   Chief Complaint  Patient presents with  . Weakness   - Appears pale and feels extremely weak-almost for more than a month now. -Noted to have significant pancytopenia. Receiving blood transfusion. -blood pressure is low and on pressors  REVIEW OF SYSTEMS:  Review of Systems  Constitutional: Positive for malaise/fatigue. Negative for chills and fever.  HENT: Negative for congestion, ear discharge, hearing loss and nosebleeds.   Eyes: Negative for blurred vision.  Respiratory: Negative for cough, shortness of breath and wheezing.   Cardiovascular: Positive for leg swelling. Negative for chest pain and palpitations.  Gastrointestinal: Negative for abdominal pain, constipation, diarrhea, nausea and vomiting.  Genitourinary: Negative for dysuria and urgency.  Musculoskeletal: Positive for myalgias.  Neurological: Positive for weakness. Negative for dizziness, speech change, focal weakness, seizures and headaches.  Psychiatric/Behavioral: Negative for depression.    DRUG ALLERGIES:  No Known Allergies  VITALS:  Blood pressure 101/66, pulse 67, temperature 97.9 F (36.6 C), temperature source Oral, resp. rate 11, height 5\' 9"  (1.753 m), weight 104.3 kg (230 lb), SpO2 99 %.  PHYSICAL EXAMINATION:  Physical Exam  GENERAL:  56 y.o.-year-old patient lying in the bed with no acute distress.  EYES: Pupils equal, round, reactive to light and accommodation. No scleral icterus. Extraocular muscles intact. Pale conjunctiva HEENT: Head atraumatic, normocephalic. Oropharynx and nasopharynx clear.  NECK:  Supple, no jugular venous distention. No thyroid enlargement, no tenderness.  LUNGS: Normal breath sounds bilaterally, no wheezing, rales,rhonchi or crepitation. No use of accessory muscles of respiration. Decreased bibasilar  breath sounds noted CARDIOVASCULAR: S1, S2 normal. No  rubs, or gallops. 2/6 systolic murmur present ABDOMEN: Soft, nontender, nondistended. Bowel sounds present. No organomegaly or mass.  EXTREMITIES: No cyanosis, or clubbing. 2+ bilateral pedal edema NEUROLOGIC: Cranial nerves II through XII are intact. Muscle strength 5/5 in all extremities. Global weakness present. Sensation intact. Gait not checked.  PSYCHIATRIC: The patient is alert and oriented x 3.  SKIN: No obvious rash, lesion, or ulcer.    LABORATORY PANEL:   CBC  Recent Labs Lab 02/10/17 0204  WBC 2.3*  HGB 6.9*  HCT 20.9*  PLT 59*   ------------------------------------------------------------------------------------------------------------------  Chemistries   Recent Labs Lab 02/10/17 0204  NA 140  K 4.1  CL 108  CO2 19*  GLUCOSE 141*  BUN 43*  CREATININE 4.04*  CALCIUM 7.7*   ------------------------------------------------------------------------------------------------------------------  Cardiac Enzymes  Recent Labs Lab 02/10/17 0204  TROPONINI <0.03   ------------------------------------------------------------------------------------------------------------------  RADIOLOGY:  Ct Abdomen Pelvis Wo Contrast  Result Date: 02/10/2017 CLINICAL DATA:  Acute onset of dizziness. Status post fall in shower, with generalized weakness. Hypotension. Initial encounter. EXAM: CT ABDOMEN AND PELVIS WITHOUT CONTRAST TECHNIQUE: Multidetector CT imaging of the abdomen and pelvis was performed following the standard protocol without IV contrast. COMPARISON:  None. FINDINGS: Lower chest: Mild bibasilar atelectasis is noted. The visualized portions of the mediastinum are unremarkable. Hepatobiliary: The liver is grossly unremarkable in appearance. The gallbladder is grossly unremarkable. The common bile duct remains normal in caliber. Pancreas: The pancreas is within normal limits. Spleen: The spleen is enlarged,  measuring 17.7 cm in length. Adrenals/Urinary Tract: The adrenal glands are grossly unremarkable appearance. The kidneys are grossly unremarkable in appearance. There is no evidence of hydronephrosis. No renal or ureteral stones are identified. Nonspecific perinephric stranding is noted  bilaterally. Stomach/Bowel: The stomach is unremarkable in appearance. The small bowel is within normal limits. The appendix is not visualized; there is no evidence for appendicitis. The colon is unremarkable in appearance. Vascular/Lymphatic: The abdominal aorta is unremarkable in appearance. The inferior vena cava is grossly unremarkable. Visualized retroperitoneal nodes remain normal in size. No pelvic sidewall lymphadenopathy is identified. Reproductive: The bladder is mildly distended and grossly unremarkable. The prostate remains normal in size, with scattered calcifications. Other: Small to moderate volume ascites is noted within the abdomen and pelvis. Soft tissue edema along the abdominal wall raises concern for mild anasarca. Musculoskeletal: No acute osseous abnormalities are identified. Vacuum phenomenon is noted at L1-L2. Chronic bilateral pars defects are seen at L5, without evidence of anterolisthesis. The visualized musculature is unremarkable in appearance. IMPRESSION: 1. No acute abnormality seen to explain the patient's symptoms. 2. Small to moderate volume ascites noted within the abdomen and pelvis. 3. Splenomegaly. 4. Soft tissue edema along the abdominal wall raises concern for mild anasarca. 5. Mild bibasilar atelectasis. 6. Chronic bilateral pars defects at L5, without evidence of anterolisthesis. Electronically Signed   By: Garald Balding M.D.   On: 02/10/2017 05:01   Ct Head Wo Contrast  Result Date: 02/10/2017 CLINICAL DATA:  Acute onset of dizziness after fall. Generalized weakness. Initial encounter. EXAM: CT HEAD WITHOUT CONTRAST TECHNIQUE: Contiguous axial images were obtained from the base of the  skull through the vertex without intravenous contrast. COMPARISON:  CT of the head performed 08/19/2015 FINDINGS: Brain: No evidence of acute infarction, hemorrhage, hydrocephalus, extra-axial collection or mass lesion/mass effect. Prominence of the ventricles and sulci reflects mild cortical volume loss. Mild cerebellar atrophy is noted. The brainstem and fourth ventricle are within normal limits. The basal ganglia are unremarkable in appearance. The cerebral hemispheres demonstrate grossly normal gray-white differentiation. No mass effect or midline shift is seen. Vascular: No hyperdense vessel or unexpected calcification. Skull: There is no evidence of fracture; visualized osseous structures are unremarkable in appearance. Sinuses/Orbits: The visualized portions of the orbits are within normal limits. The paranasal sinuses and mastoid air cells are well-aerated. Other: No significant soft tissue abnormalities are seen. IMPRESSION: 1. No evidence of traumatic intracranial injury or fracture. 2. Mild cortical volume loss noted. Electronically Signed   By: Garald Balding M.D.   On: 02/10/2017 03:15    EKG:   Orders placed or performed during the hospital encounter of 02/10/17  . EKG 12-Lead  . EKG 12-Lead  . EKG 12-Lead  . 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 -Hemoglobin noted to be at 6.9. No evidence of infection noted.. Blood cultures are pending -2 units packed RBC transfusion ordered -Continue ICU stay, on Neo-Synephrine drip -No active bleeding noted, could be secondary to [place] versus worsening CML -Stool for guaiac was positive. Consider GI consult on Protonix and start clear liquids  #2 acute renal  failure-acute on chronic kidney disease, renal function worsened from creatinine of 1.524 -no obstruction noted on CT -Nephrology consult recommended -IV fluids and avoid nephrotoxins  #3 pancytopenia-secondary to worsening CML versus restarted on Gleevec about 6 weeks ago for symptoms have been persistent for almost 4 weeks now. -Hold Gleevec -Support with transfusion -Currently no indication for platelet transfusion at this time unless there is  active bleeding or platelets less than 20 K -Received blood transfusion today -Oncology consulted  #4 schizophrenia disorder- tinea home medications. Patient on Prolixin, Prozac, Depakote, Cogentin, Seroquel and Ingrezza  #5 diabetes mellitus-on Tradjenta and sliding scale insulin  #6 DVT prophylaxis-Ted's and SCDs at this time    All the records are reviewed and case discussed with Care Management/Social Workerr. Management plans discussed with the patient, family and they are in agreement.  CODE STATUS: Full Code  TOTAL TIME TAKING CARE OF THIS PATIENT: 37 minutes.   POSSIBLE D/C IN 2-3 DAYS, DEPENDING ON CLINICAL CONDITION.   Grisel Blumenstock M.D on 02/10/2017 at 10:01 AM  Between 7am to 6pm - Pager - [phone]  After 6pm go to [email or web address] - password [email or web address] [place] [facility]  Office  [phone]  CC: Primary care physician; Idelle Crouch, MD

## 2017-02-10 NOTE — ED Notes (Signed)
Patient transported to CT 

## 2017-02-10 NOTE — Consult Note (Signed)
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 He presented to the emergency room with worsening weakness and fatigue and was found to be hypotensive with pancytopenia and acute renal failure. Given his underlying schizophrenia, review of systems is difficult but patient offers no complaints. He states he feels improved since admission. His no neurologic complaints. He denies any recent fevers. He has no chest pain or shortness of breath. He denies any nausea, vomiting, constipation, or diarrhea. He has a good appetite. He has no urinary complaints. Patient offers no specific complaints today.  REVIEW OF SYSTEMS:   Review of Systems  Constitutional: Positive for malaise/fatigue. Negative for fever and weight loss.  Respiratory: Negative.  Negative for cough and shortness of breath.   Cardiovascular: Negative.  Negative for chest pain and leg swelling.  Gastrointestinal: Negative.  Negative for abdominal pain.  Genitourinary: Negative.  Negative for hematuria.  Musculoskeletal: Negative.   Skin: Negative.  Negative for rash.  Neurological: Positive for weakness.  Psychiatric/Behavioral: Positive for depression. The patient is not nervous/anxious.     As per HPI. Otherwise, a complete review of systems is negative.  PAST MEDICAL HISTORY: Past Medical History:  Diagnosis Date  . Arrhythmia    Atrial Fibrillation; Atrial Flutter w/RVR; SVT  . Depression   . Diabetes ([place])   . Dysrhythmia   . GERD (gastroesophageal reflux disease)   . Hypercholesteremia   . Hypertension   . Schizophrenia ([place])    . Sleep apnea   . Tremor     PAST SURGICAL HISTORY: Past Surgical History:  Procedure Laterality Date  . CARDIOVERSION     x2, a-fib  . COLONOSCOPY WITH PROPOFOL N/A 02/25/2015   Procedure: COLONOSCOPY WITH PROPOFOL;  Surgeon: Josefine Class, MD;  Location: [facility] ENDOSCOPY;  Service: Endoscopy;  Laterality: N/A;  . COLONOSCOPY WITH PROPOFOL N/A 04/12/2015   Procedure: COLONOSCOPY WITH PROPOFOL;  Surgeon: Josefine Class, MD;  Location: [facility] ENDOSCOPY;  Service: Endoscopy;  Laterality: N/A;    FAMILY HISTORY: No family history on file.  ADVANCED DIRECTIVES (Y/N):  @ADVDIR @  HEALTH MAINTENANCE: Social History  Substance Use Topics  . Smoking status: Never Smoker  . Smokeless tobacco: Never Used  . Alcohol use No     Colonoscopy:  PAP:  Bone density:  Lipid panel:  No Known Allergies  Current Facility-Administered Medications  Medication Dose Route Frequency Provider Last Rate Last Dose  . 0.9 %  sodium chloride infusion   Intravenous Continuous Wilhelmina Mcardle, MD 10 mL/hr at 02/10/17 1314    . acetaminophen (TYLENOL) tablet 650 mg  650 mg Oral Q6H PRN Harrie Foreman, MD       Or  . acetaminophen (TYLENOL) suppository 650 mg  650 mg Rectal Q6H PRN Harrie Foreman, MD      . docusate sodium (COLACE) capsule 100 mg  100 mg Oral BID Harrie Foreman, MD   100 mg at 02/10/17 1015  . FLUoxetine (PROZAC) capsule 20 mg  20 mg Oral Daily Harrie Foreman, MD   20 mg at 02/10/17 1311  . gabapentin (NEURONTIN) capsule 300 mg  300 mg Oral QHS Harrie Foreman,  MD      . insulin aspart (novoLOG) injection 0-5 Units  0-5 Units Subcutaneous QHS Harrie Foreman, MD      . insulin aspart (novoLOG) injection 0-9 Units  0-9 Units Subcutaneous TID WC Harrie Foreman, MD      . LORazepam (ATIVAN) tablet 0.5 mg  0.5 mg Oral BID Harrie Foreman, MD   0.5 mg at 02/10/17 1015  . multivitamins with iron tablet 1 tablet  1 tablet Oral Daily Harrie Foreman, MD    1 tablet at 02/10/17 1015  . OLANZapine (ZYPREXA) tablet 5 mg  5 mg Oral QHS Clapacs, John T, MD      . ondansetron [facility]) tablet 4 mg  4 mg Oral Q6H PRN Harrie Foreman, MD       Or  . ondansetron [facility]) injection 4 mg  4 mg Intravenous Q6H PRN Harrie Foreman, MD      . pantoprazole (PROTONIX) EC tablet 40 mg  40 mg Oral Daily Harrie Foreman, MD   40 mg at 02/10/17 1015  . phenylephrine (NEO-SYNEPHRINE) 10 mg in sodium chloride 0.9 % 250 mL (0.04 mg/mL) infusion  0-400 mcg/min Intravenous Titrated Awilda Bill, NP   Stopped at 02/10/17 1100  . polyethylene glycol (MIRALAX / GLYCOLAX) packet 17 g  17 g Oral Daily Harrie Foreman, MD   17 g at 02/10/17 1000  . QUEtiapine (SEROQUEL) tablet 100 mg  100 mg Oral QHS Clapacs, John T, MD      . rOPINIRole (REQUIP) tablet 2 mg  2 mg Oral QHS Harrie Foreman, MD      . rosuvastatin (CRESTOR) tablet 20 mg  20 mg Oral QHS Harrie Foreman, MD      . senna-docusate (Senokot-S) tablet 1 tablet  1 tablet Oral BID Harrie Foreman, MD   1 tablet at 02/10/17 1015    OBJECTIVE: Vitals:   02/10/17 1200 02/10/17 1300  BP: 108/65 (!) 103/56  Pulse: 68 69  Resp: 12 10  Temp:    SpO2: 100% 100%     Body mass index is 33.97 kg/m.    ECOG FS:2 - Symptomatic, <50% confined to bed  General: Well-developed, well-nourished, no acute distress. Eyes: Pink conjunctiva, anicteric sclera. HEENT: Normocephalic, moist mucous membranes, clear oropharnyx. Lungs: Clear to auscultation bilaterally. Heart: Regular rate and rhythm. No rubs, murmurs, or gallops. Abdomen: Soft, nontender, nondistended. No organomegaly noted, normoactive bowel sounds. Musculoskeletal: No edema, cyanosis, or clubbing. Neuro: Alert, answering all questions appropriately. Cranial nerves grossly intact. Skin: No rashes or petechiae noted. Psych: Normal affect. Lymphatics: No cervical, calvicular, axillary or inguinal LAD.   LAB RESULTS:  Lab Results  Component  Value Date   NA 140 02/10/2017   K 4.1 02/10/2017   CL 108 02/10/2017   CO2 19 (L) 02/10/2017   GLUCOSE 141 (H) 02/10/2017   BUN 43 (H) 02/10/2017   CREATININE 4.04 (H) 02/10/2017   CALCIUM 7.7 (L) 02/10/2017   PROT 5.2 (L) 02/10/2017   ALBUMIN 2.7 (L) 02/10/2017   AST 89 (H) 02/10/2017   ALT 20 02/10/2017   ALKPHOS 50 02/10/2017   BILITOT 1.2 02/10/2017   GFRNONAA 15 (L) 02/10/2017   GFRAA 18 (L) 02/10/2017    Lab Results  Component Value Date   WBC 2.3 (L) 02/10/2017   NEUTROABS 2.4 12/30/2016   HGB 6.9 (L) 02/10/2017   HCT 20.9 (L) 02/10/2017   MCV 100.6 (H) 02/10/2017   PLT 59 (L)  02/10/2017     STUDIES: Ct Abdomen Pelvis Wo Contrast  Result Date: 02/10/2017 CLINICAL DATA:  Acute onset of dizziness. Status post fall in shower, with generalized weakness. Hypotension. Initial encounter. EXAM: CT ABDOMEN AND PELVIS WITHOUT CONTRAST TECHNIQUE: Multidetector CT imaging of the abdomen and pelvis was performed following the standard protocol without IV contrast. COMPARISON:  None. FINDINGS: Lower chest: Mild bibasilar atelectasis is noted. The visualized portions of the mediastinum are unremarkable. Hepatobiliary: The liver is grossly unremarkable in appearance. The gallbladder is grossly unremarkable. The common bile duct remains normal in caliber. Pancreas: The pancreas is within normal limits. Spleen: The spleen is enlarged, measuring 17.7 cm in length. Adrenals/Urinary Tract: The adrenal glands are grossly unremarkable appearance. The kidneys are grossly unremarkable in appearance. There is no evidence of hydronephrosis. No renal or ureteral stones are identified. Nonspecific perinephric stranding is noted bilaterally. Stomach/Bowel: The stomach is unremarkable in appearance. The small bowel is within normal limits. The appendix is not visualized; there is no evidence for appendicitis. The colon is unremarkable in appearance. Vascular/Lymphatic: The abdominal aorta is unremarkable  in appearance. The inferior vena cava is grossly unremarkable. Visualized retroperitoneal nodes remain normal in size. No pelvic sidewall lymphadenopathy is identified. Reproductive: The bladder is mildly distended and grossly unremarkable. The prostate remains normal in size, with scattered calcifications. Other: Small to moderate volume ascites is noted within the abdomen and pelvis. Soft tissue edema along the abdominal wall raises concern for mild anasarca. Musculoskeletal: No acute osseous abnormalities are identified. Vacuum phenomenon is noted at L1-L2. Chronic bilateral pars defects are seen at L5, without evidence of anterolisthesis. The visualized musculature is unremarkable in appearance. IMPRESSION: 1. No acute abnormality seen to explain the patient's symptoms. 2. Small to moderate volume ascites noted within the abdomen and pelvis. 3. Splenomegaly. 4. Soft tissue edema along the abdominal wall raises concern for mild anasarca. 5. Mild bibasilar atelectasis. 6. Chronic bilateral pars defects at L5, without evidence of anterolisthesis. Electronically Signed   By: Garald Balding M.D.   On: 02/10/2017 05:01   Ct Head Wo Contrast  Result Date: 02/10/2017 CLINICAL DATA:  Acute onset of dizziness after fall. Generalized weakness. Initial encounter. EXAM: CT HEAD WITHOUT CONTRAST TECHNIQUE: Contiguous axial images were obtained from the base of the skull through the vertex without intravenous contrast. COMPARISON:  CT of the head performed 08/19/2015 FINDINGS: Brain: No evidence of acute infarction, hemorrhage, hydrocephalus, extra-axial collection or mass lesion/mass effect. Prominence of the ventricles and sulci reflects mild cortical volume loss. Mild cerebellar atrophy is noted. The brainstem and fourth ventricle are within normal limits. The basal ganglia are unremarkable in appearance. The cerebral hemispheres demonstrate grossly normal gray-white differentiation. No mass effect or midline shift is  seen. Vascular: No hyperdense vessel or unexpected calcification. Skull: There is no evidence of fracture; visualized osseous structures are unremarkable in appearance. Sinuses/Orbits: The visualized portions of the orbits are within normal limits. The paranasal sinuses and mastoid air cells are well-aerated. Other: No significant soft tissue abnormalities are seen. IMPRESSION: 1. No evidence of traumatic intracranial injury or fracture. 2. Mild cortical volume loss noted. Electronically Signed   By: Garald Balding M.D.   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  PLAN:    1. CML: Patient was reinitiated on [place] approximately 6 weeks ago. Given his pancytopenia, agree with discontinuing Gleevec at this time. 2. Pancytopenia: Most likely secondary to [place], but progression of disease is a  possibility. Hold Gleevec as above. Continue to transfuse as indicated. 3. Acute renal failure: Most likely secondary to hypovolemia. Agree with nephrology consult. Monitor. 4. Schizophrenia: Continue current medications as prescribed.  Appreciate consult, will follow.   Lloyd Huger, MD   02/10/2017 2:43 PM

## 2017-02-10 NOTE — ED Notes (Signed)
Woke pt to sign consent. Pt says he is unable and shakes bad. Pt states he is right handed. Attempted to assist pt unsuccessful. Pt gave this RN verbal consent to receive blood transfusion. MD is aware that pt is unable to give signed consent.

## 2017-02-10 NOTE — ED Provider Notes (Signed)
[facility] Emergency Department Provider Note   ____________________________________________   First MD Initiated Contact with Patient 02/10/17 0153     (approximate)  I have reviewed the triage vital signs and the nursing notes.   HISTORY  Chief Complaint Weakness    HPI Jahdiel Krol is a 56 y.o. male who comes into the hospital today after having a dizzy episode and falling in the shower. The patient states that this evening he was in the shower and got dizzy. He states that his legs were weak and then he fell. His initial blood pressure when EMS was 90/40. They did give him some fluids and his pressure seemed to improve. The patient's legs to gotten weak multiple times. He has a history of leukemia and has been taking oral chemotherapy. His dose was recently decreased but they're unsure exactly why. The patient states that normally when he falls he is able to get up but tonight he just could not get up on his own. The patient denies any chest pain, shortness of breath, nausea, vomiting, headaches, blurred vision. When asked if his dizziness was due to the room spinning or feeling lightheaded he states that it was lightheaded. He reports that his legs hurt and rates his pain a 7 out of 10 in intensity. He states it is not uncommon for him to have leg pains. He is here today for evaluation.   Past Medical History:  Diagnosis Date  . Arrhythmia    Atrial Fibrillation; Atrial Flutter w/RVR; SVT  . Depression   . Diabetes ([place])   . Dysrhythmia   . GERD (gastroesophageal reflux disease)   . Hypercholesteremia   . Hypertension   . Schizophrenia ([place])   . Sleep apnea   . Tremor     Patient Active Problem List   Diagnosis Date Noted  . CML (chronic myeloid leukemia) ([place]) 03/22/2016  . MDS (myelodysplastic syndrome) ([place]) 03/11/2016  . 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 Neuroleptic-induced tardive dyskinesia  06/19/2013  . Schizophrenia ([place]) 06/19/2013    Past Surgical History:  Procedure Laterality Date  . CARDIOVERSION     x2, a-fib  . COLONOSCOPY WITH PROPOFOL N/A 02/25/2015   Procedure: COLONOSCOPY WITH PROPOFOL;  Surgeon: Josefine Class, MD;  Location: [facility] ENDOSCOPY;  Service: Endoscopy;  Laterality: N/A;  . COLONOSCOPY WITH PROPOFOL N/A 04/12/2015   Procedure: COLONOSCOPY WITH PROPOFOL;  Surgeon: Josefine Class, MD;  Location: [facility] ENDOSCOPY;  Service: Endoscopy;  Laterality: N/A;    Prior to Admission medications   Medication Sig Start Date End Date Taking? Authorizing Provider  benztropine (COGENTIN) 0.5 MG tablet Take 0.5 mg by mouth 2 (two) times daily.   Yes [provider]  divalproex (DEPAKOTE ER) 500 MG 24 hr tablet Take 2,000 mg by mouth at bedtime.   Yes [provider]  FLUoxetine (PROZAC) 20 MG capsule Take 20 mg by mouth daily.   Yes [provider]  fluPHENAZine (PROLIXIN) 5 MG tablet Take 5 mg by mouth 2 (two) times daily.   Yes [provider]  furosemide (LASIX) 20 MG tablet Take 20 mg by mouth daily.   Yes [provider]  gabapentin (NEURONTIN) 300 MG capsule Take 300 mg by mouth at bedtime.    Yes [provider]  imatinib (GLEEVEC) 400 MG tablet Take 1 tablet (400 mg total) by mouth daily. Take with meals and large glass of water.Caution:Chemotherapy. 12/16/16  Yes Lloyd Huger, MD  INGREZZA 80 MG CAPS Take 80 mg by mouth daily.   Yes [provider]  LORazepam (ATIVAN) 0.5 MG tablet Take 0.5 mg by mouth 2 (two) times daily.   Yes [provider]  metFORMIN (GLUCOPHAGE) 500 MG tablet Take 500 mg by mouth 2 (two) times daily with a meal.   Yes [provider]  metoprolol succinate (TOPROL-XL) 25 MG 24 hr tablet Take 25 mg by mouth 2 (two) times daily.    Yes [provider]  Multiple Vitamins-Iron (MULTIVITAMINS WITH IRON) TABS tablet Take 1 tablet by mouth daily.    Yes [provider]  pantoprazole (PROTONIX) 40 MG tablet Take 40 mg by mouth daily.   Yes [provider]  polyethylene glycol (MIRALAX / GLYCOLAX) packet Take 17 g by mouth daily.   Yes [provider]  QUEtiapine (SEROQUEL) 300 MG tablet Take 300 mg by mouth at bedtime.   Yes [provider]  rOPINIRole (REQUIP) 2 MG tablet Take 2 mg by mouth at bedtime.   Yes [provider]  rosuvastatin (CRESTOR) 20 MG tablet Take 20 mg by mouth at bedtime.    Yes [provider]  senna-docusate (SENOKOT-S) 8.6-50 MG tablet Take 1 tablet by mouth 2 (two) times daily.    Yes [provider]  sitaGLIPtin (JANUVIA) 100 MG tablet Take 100 mg by mouth daily.  08/19/16 08/19/17 Yes [provider]  allopurinol (ZYLOPRIM) 300 MG tablet Take 1 tablet (300 mg total) by mouth daily. Start treatment 1 week prior to initiating [place]. Patient not taking: Reported on 02/10/2017 03/24/16   Lloyd Huger, MD    Allergies Patient has no known allergies.  No family history on file.  Social History Social History  Substance Use Topics  . Smoking status: Never Smoker  . Smokeless tobacco: Never Used  . Alcohol use No    Review of Systems  Constitutional: No fever/chills Eyes: No visual changes. ENT: No sore throat. Cardiovascular: Denies chest pain. Respiratory: Denies shortness of breath. Gastrointestinal: No abdominal pain.  No nausea, no vomiting.  No diarrhea.  No constipation. Genitourinary: Negative for dysuria. Musculoskeletal: Negative for back pain. Skin: Negative for rash. Neurological: dizziness, lower extremity weakness   ____________________________________________   PHYSICAL EXAM:  VITAL SIGNS: ED Triage Vitals  Enc Vitals Group     BP 02/10/17 0206 (!) 88/49     Pulse Rate 02/10/17 0207 77     Resp 02/10/17 0206 13     Temp 02/10/17 0207 98.2 F (36.8 C)     Temp Source 02/10/17 0207 Oral     SpO2  02/10/17 0207 97 %     Weight 02/10/17 0208 230 lb (104.3 kg)     Height 02/10/17 0208 5\' 9"  (1.753 m)     Head Circumference --      Peak Flow --      Pain Score --      Pain Loc --      Pain Edu? --      Excl. in [place]? --     Constitutional: Alert and oriented. Pale appearing and in moderate distress. Eyes: Conjunctivae are normal. PERRL. EOMI. Head: Atraumatic. Nose: No congestion/rhinnorhea. Mouth/Throat: Mucous membranes are moist.  Oropharynx non-erythematous. Cardiovascular: Normal rate, regular rhythm. Grossly normal heart sounds.  Good peripheral circulation. Respiratory: Normal respiratory effort.  No retractions. Lungs CTAB. Gastrointestinal: Soft and nontender. No distention. Positive bowel sounds Musculoskeletal: No lower extremity tenderness nor edema.  Neurologic:  Normal speech and  language.  Psychiatric: Mood and affect are normal.   ____________________________________________   LABS (all labs ordered are listed, but only abnormal results are displayed)  Labs Reviewed  CBC - Abnormal; Notable for the following:       Result Value   WBC 2.3 (*)    RBC 2.08 (*)    Hemoglobin 6.9 (*)    HCT 20.9 (*)    MCV 100.6 (*)    RDW 20.0 (*)    Platelets 59 (*)    All other components within normal limits  BASIC METABOLIC PANEL - Abnormal; Notable for the following:    CO2 19 (*)    Glucose, Bld 141 (*)    BUN 43 (*)    Creatinine, Ser 4.04 (*)    Calcium 7.7 (*)    GFR calc non Af Amer 15 (*)    GFR calc Af Amer 18 (*)    All other components within normal limits  LACTIC ACID, PLASMA - Abnormal; Notable for the following:    Lactic Acid, Venous 5.0 (*)    All other components within normal limits  TROPONIN I  LACTIC ACID, PLASMA  URINALYSIS, COMPLETE (UACMP) WITH MICROSCOPIC  TYPE AND SCREEN  PREPARE RBC (CROSSMATCH)  ABO/RH   ____________________________________________  EKG  ED ECG REPORT I, Loney Hering, the attending physician, personally  viewed and interpreted this ECG.   Date: 02/10/2017  EKG Time: 327  Rate: 76  Rhythm: normal sinus rhythm  Axis: normal  Intervals:prolonged qtc  ST&T Change: diffuse t wave flattening   ____________________________________________  RADIOLOGY  Ct Head Wo Contrast  Result Date: 02/10/2017 CLINICAL DATA:  Acute onset of dizziness after fall. Generalized weakness. Initial encounter. EXAM: CT HEAD WITHOUT CONTRAST TECHNIQUE: Contiguous axial images were obtained from the base of the skull through the vertex without intravenous contrast. COMPARISON:  CT of the head performed 08/19/2015 FINDINGS: Brain: No evidence of acute infarction, hemorrhage, hydrocephalus, extra-axial collection or mass lesion/mass effect. Prominence of the ventricles and sulci reflects mild cortical volume loss. Mild cerebellar atrophy is noted. The brainstem and fourth ventricle are within normal limits. The basal ganglia are unremarkable in appearance. The cerebral hemispheres demonstrate grossly normal gray-white differentiation. No mass effect or midline shift is seen. Vascular: No hyperdense vessel or unexpected calcification. Skull: There is no evidence of fracture; visualized osseous structures are unremarkable in appearance. Sinuses/Orbits: The visualized portions of the orbits are within normal limits. The paranasal sinuses and mastoid air cells are well-aerated. Other: No significant soft tissue abnormalities are seen. IMPRESSION: 1. No evidence of traumatic intracranial injury or fracture. 2. Mild cortical volume loss noted. Electronically Signed   By: Garald Balding M.D.   On: 02/10/2017 03:15    ____________________________________________   PROCEDURES  Procedure(s) performed: None  Procedures  Critical Care performed: Yes, see critical care note(s)   CRITICAL CARE Performed by: Charlesetta Ivory P   Total critical care time: 45 minutes  Critical care time was exclusive of separately billable  procedures and treating other patients.  Critical care was necessary to treat or prevent imminent or life-threatening deterioration.  Critical care was time spent personally by me on the following activities: development of treatment plan with patient and/or surrogate as well as nursing, discussions with consultants, evaluation of patient's response to treatment, examination of patient, obtaining history from patient or surrogate, ordering and performing treatments and interventions, ordering and review of laboratory studies, ordering and review of radiographic studies, pulse oximetry and re-evaluation  of patient's condition.   ____________________________________________   INITIAL IMPRESSION / ASSESSMENT AND PLAN / ED COURSE  As part of my medical decision making, I reviewed the following data within the electronic MEDICAL RECORD NUMBER Notes from prior ED visits  This is a 56 year old male who comes into the hospital today with dizziness. The patient did have some low blood pressure on his initial presentation to the emergency department.  My differential diagnosis includes orthostatic hypotension, dehydration, electrolyte abnormality, stroke  I did check some blood work and based on the patient's labs he is anemic. The patient actually appears to be pancytopenic. his white blood cell count, hemoglobin, hematocrit and platelets are all low. The patient also has some acute renal failure with a creatinine of 4.04. I did perform a rectal exam on the patient and his stool was brown and red in appearance and heme positive. The patient's previous hemoglobin in August was 11.6. I am concerned that the patient has some acute blood loss anemia which is causing his symptoms. He also has a lactic acid of 5.0. I did give the patient 2 L of normal saline and then ordered a blood transfusion from the blood bank. The patient will be admitted to the hospitalist service for further treatment and evaluation as well as  stabilization. He is not in any pain and denies any blood in his stool previously. The patient's potassium is unremarkable and his sodium is also unremarkable. He did have some prolonged QTC but as he is hypotensive I will hold off on magnesium at this time.      ____________________________________________   FINAL CLINICAL IMPRESSION(S) / ED DIAGNOSES  Final diagnoses:  Dizziness  Gastrointestinal hemorrhage associated with anorectal source  Hypotension, unspecified hypotension type  Anemia due to acute blood loss  Acute renal failure, unspecified acute renal failure type (HCC)  Lactic acidosis      NEW MEDICATIONS STARTED DURING THIS VISIT:  New Prescriptions   No medications on file     Note:  This document was prepared using Dragon voice recognition software and may include unintentional dictation errors.    Loney Hering, MD 02/10/17 [id number]

## 2017-02-10 NOTE — Consult Note (Signed)
[facility] Consult   Reason for Consult:  Consult for 56 year old man with a history of schizophrenia or schizoaffective disorder who is currently in the hospital for weakness and anemia Referring Physician:  Tressia Miners Patient Identification: Lawrence Johnston MRN:  [id number] Principal Diagnosis: Schizophrenia [facility]) Diagnosis:   Patient Active Problem List   Diagnosis Date Noted  . Symptomatic anemia [D64.9] 02/10/2017  . 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 MDS (myelodysplastic syndrome) ([place]) [D46.9] 03/11/2016  . 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 Schizophrenia ([place]) [F20.9] 06/19/2013    Total Time spent with patient: 1 hour  Subjective:   Lawrence Johnston is a 56 y.o. male patient admitted with "I had a fall".  HPI:  Patient interviewed chart reviewed. 56 year old man with a history of chronic mental health problems came into the hospital with a recent fall along with generalized weakness. In the emergency room he was found to be very anemic and do have significant worsening of his renal failure. Admitted to the intensive care unit. Consult for concerns about his psychiatric medicine. Patient has no specific psychiatric complaints. Says he is not necessarily feeling depressed. Sleep has been poor but not necessarily worse than usual. He has chronic auditory hallucinations but says they are not any worse than usual. Denies any acute suicidal or homicidal thoughts. He has been taking his medicine regularly. There is the obvious new stress of his recently diagnosed with leukemia  Social history: Lives in a group home. Not clear to me whether he has a guardian but I think he probably doesn't from what I can see.  Medical history: Has multiple medical problems. Has recently diagnosed leukemia. Acutely he has pancytopenia which has now been  a recurrent problem over the last couple months. Possible GI bleed. Significant worsening of renal insufficiency. History of tardive dyskinesia.  Substance abuse history: Not drinking not using any drugs I don't think there is been a significant past history  Past Psychiatric History: [name] Last time was in the distant past. He has been maintained on his current combination of psychiatric medicines that includes Seroquel Depakote Prolixin for at least a few years without any significant change.  Risk to Self: Is patient at risk for suicide?: No Risk to Others:   Prior Inpatient Therapy:   Prior Outpatient Therapy:    Past Medical History:  Past Medical History:  Diagnosis Date  . Arrhythmia    Atrial Fibrillation; Atrial Flutter w/RVR; SVT  . Depression   . Diabetes ([place])   . Dysrhythmia   . GERD (gastroesophageal reflux disease)   . Hypercholesteremia   . Hypertension   . Schizophrenia ([place])   . Sleep apnea   . Tremor     Past Surgical History:  Procedure Laterality Date  . CARDIOVERSION     x2, a-fib  . COLONOSCOPY WITH PROPOFOL N/A 02/25/2015   Procedure: COLONOSCOPY WITH PROPOFOL;  Surgeon: Josefine Class, MD;  Location: [facility] ENDOSCOPY;  Service: Endoscopy;  Laterality: N/A;  . COLONOSCOPY WITH PROPOFOL N/A 04/12/2015   Procedure: COLONOSCOPY WITH PROPOFOL;  Surgeon: Josefine Class, MD;  Location: [facility] ENDOSCOPY;  Service: Endoscopy;  Laterality: N/A;   Family History: No family history on file. Family Psychiatric  History: Positive for mood disorder Social History:  History  Alcohol Use No     History  Drug Use No    Social History   Social History  . Marital status: Single    Spouse name: N/A  . Number of children: N/A  . Years of education: N/A   Social History Main Topics  . Smoking status: Never Smoker  . Smokeless tobacco: Never Used  . Alcohol  use No  . Drug use: No  . Sexual activity: Not Asked   Other Topics Concern  . None   Social History Narrative  . None   Additional Social History:    Allergies:  No Known Allergies  Labs:  Results for orders placed or performed during the hospital encounter of 02/10/17 (from the past 48 hour(s))  CBC     Status: Abnormal   Collection Time: 02/10/17  2:04 AM  Result Value Ref Range   WBC 2.3 (L) 3.8 - 10.6 K/uL   RBC 2.08 (L) 4.40 - 5.90 MIL/uL   Hemoglobin 6.9 (L) 13.0 - 18.0 g/dL   HCT 20.9 (L) 40.0 - 52.0 %   MCV 100.6 (H) 80.0 - 100.0 fL   MCH 33.1 26.0 - 34.0 pg   MCHC 32.9 32.0 - 36.0 g/dL   RDW 20.0 (H) 11.5 - 14.5 %   Platelets 59 (L) 150 - 440 K/uL  Basic metabolic panel     Status: Abnormal   Collection Time: 02/10/17  2:04 AM  Result Value Ref Range   Sodium 140 135 - 145 mmol/L   Potassium 4.1 3.5 - 5.1 mmol/L   Chloride 108 101 - 111 mmol/L   CO2 19 (L) 22 - 32 mmol/L   Glucose, Bld 141 (H) 65 - 99 mg/dL   BUN 43 (H) 6 - 20 mg/dL   Creatinine, Ser 4.04 (H) 0.61 - 1.24 mg/dL   Calcium 7.7 (L) 8.9 - 10.3 mg/dL   GFR calc non Af Amer 15 (L) >60 mL/min   GFR calc Af Amer 18 (L) >60 mL/min    Comment: (NOTE) The eGFR has been calculated using the CKD EPI equation. This calculation has not been validated in all clinical situations. eGFR's persistently <60 mL/min signify possible Chronic Kidney Disease.    Anion gap 13 5 - 15  Troponin I     Status: None   Collection Time: 02/10/17  2:04 AM  Result Value Ref Range   Troponin I <0.03 <0.03 ng/mL  Lactic acid, plasma     Status: Abnormal   Collection Time: 02/10/17  2:04 AM  Result Value Ref Range   Lactic Acid, Venous 5.0 (HH) 0.5 - 1.9 mmol/L    Comment: CRITICAL RESULT CALLED TO, READ BACK BY AND VERIFIED WITH AMY COYNE @ 9758 ON 02/10/2017 BY CAF   ABO/Rh     Status: None   Collection Time: 02/10/17  2:04 AM  Result Value Ref Range   ABO/RH(D) O POS   TSH     Status: None   Collection Time:  02/10/17  2:04 AM  Result Value Ref Range   TSH 2.208 0.350 - 4.500 uIU/mL    Comment: Performed by a 3rd Generation assay with a functional sensitivity of <=0.01 uIU/mL.  Type and screen [place]     Status: None (Preliminary result)   Collection Time: 02/10/17  2:56 AM  Result Value Ref Range   ABO/RH(D) O POS    Antibody Screen NEG    Sample Expiration 02/13/2017    Unit Number [id number]    Blood Component Type RED CELLS,LR  Unit division 00    Status of Unit ISSUED    Transfusion Status OK TO TRANSFUSE    Crossmatch Result Compatible    Unit Number [id number]    Blood Component Type RED CELLS,LR    Unit division 00    Status of Unit ISSUED    Transfusion Status OK TO TRANSFUSE    Crossmatch Result Compatible   Prepare RBC     Status: None   Collection Time: 02/10/17  2:56 AM  Result Value Ref Range   Order Confirmation ORDER PROCESSED BY BLOOD BANK   MRSA PCR Screening     Status: None   Collection Time: 02/10/17  4:54 AM  Result Value Ref Range   MRSA by PCR NEGATIVE NEGATIVE    Comment:        The GeneXpert MRSA Assay (FDA approved for NASAL specimens only), is one component of a comprehensive MRSA colonization surveillance program. It is not intended to diagnose MRSA infection nor to guide or monitor treatment for MRSA infections.   Glucose, capillary     Status: Abnormal   Collection Time: 02/10/17  4:55 AM  Result Value Ref Range   Glucose-Capillary 108 (H) 65 - 99 mg/dL  Lactic acid, plasma     Status: None   Collection Time: 02/10/17  5:30 AM  Result Value Ref Range   Lactic Acid, Venous 1.8 0.5 - 1.9 mmol/L  Glucose, capillary     Status: None   Collection Time: 02/10/17  8:48 AM  Result Value Ref Range   Glucose-Capillary 97 65 - 99 mg/dL   Comment 1 Notify RN   Urinalysis, Complete w Microscopic     Status: Abnormal   Collection Time: 02/10/17 11:25 AM  Result Value Ref Range   Color, Urine YELLOW (A) YELLOW    APPearance CLEAR (A) CLEAR   Specific Gravity, Urine 1.017 1.005 - 1.030   pH 5.0 5.0 - 8.0   Glucose, UA NEGATIVE NEGATIVE mg/dL   Hgb urine dipstick NEGATIVE NEGATIVE   Bilirubin Urine NEGATIVE NEGATIVE   Ketones, ur NEGATIVE NEGATIVE mg/dL   Protein, ur NEGATIVE NEGATIVE mg/dL   Nitrite NEGATIVE NEGATIVE   Leukocytes, UA NEGATIVE NEGATIVE   RBC / HPF 0-5 0 - 5 RBC/hpf   WBC, UA 0-5 0 - 5 WBC/hpf   Bacteria, UA NONE SEEN NONE SEEN   Squamous Epithelial / LPF 0-5 (A) NONE SEEN   Mucus PRESENT    Hyaline Casts, UA PRESENT   Glucose, capillary     Status: None   Collection Time: 02/10/17 11:31 AM  Result Value Ref Range   Glucose-Capillary 89 65 - 99 mg/dL   Comment 1 Notify RN   Ammonia     Status: None   Collection Time: 02/10/17 12:49 PM  Result Value Ref Range   Ammonia 14 9 - 35 umol/L  Valproic acid level     Status: None   Collection Time: 02/10/17 12:56 PM  Result Value Ref Range   Valproic Acid Lvl 54 50.0 - 100.0 ug/mL  Hepatic function panel     Status: Abnormal   Collection Time: 02/10/17 12:56 PM  Result Value Ref Range   Total Protein 5.2 (L) 6.5 - 8.1 g/dL   Albumin 2.7 (L) 3.5 - 5.0 g/dL   AST 89 (H) 15 - 41 U/L   ALT 20 17 - 63 U/L   Alkaline Phosphatase 50 38 - 126 U/L   Total Bilirubin 1.2 0.3 - 1.2 mg/dL   Bilirubin,  Direct 0.2 0.1 - 0.5 mg/dL   Indirect Bilirubin 1.0 (H) 0.3 - 0.9 mg/dL    Current Facility-Administered Medications  Medication Dose Route Frequency Provider Last Rate Last Dose  . 0.9 %  sodium chloride infusion   Intravenous Continuous Wilhelmina Mcardle, MD 10 mL/hr at 02/10/17 1314    . acetaminophen (TYLENOL) tablet 650 mg  650 mg Oral Q6H PRN Harrie Foreman, MD       Or  . acetaminophen (TYLENOL) suppository 650 mg  650 mg Rectal Q6H PRN Harrie Foreman, MD      . docusate sodium (COLACE) capsule 100 mg  100 mg Oral BID Harrie Foreman, MD   100 mg at 02/10/17 1015  . FLUoxetine (PROZAC) capsule 20 mg  20 mg Oral Daily  Harrie Foreman, MD   20 mg at 02/10/17 1311  . gabapentin (NEURONTIN) capsule 300 mg  300 mg Oral QHS Harrie Foreman, MD      . insulin aspart (novoLOG) injection 0-5 Units  0-5 Units Subcutaneous QHS Harrie Foreman, MD      . insulin aspart (novoLOG) injection 0-9 Units  0-9 Units Subcutaneous TID WC Harrie Foreman, MD      . LORazepam (ATIVAN) tablet 0.5 mg  0.5 mg Oral BID Harrie Foreman, MD   0.5 mg at 02/10/17 1015  . multivitamins with iron tablet 1 tablet  1 tablet Oral Daily Harrie Foreman, MD   1 tablet at 02/10/17 1015  . OLANZapine (ZYPREXA) tablet 5 mg  5 mg Oral QHS Prabhjot Piscitello T, MD      . ondansetron [facility]) tablet 4 mg  4 mg Oral Q6H PRN Harrie Foreman, MD       Or  . ondansetron [facility]) injection 4 mg  4 mg Intravenous Q6H PRN Harrie Foreman, MD      . pantoprazole (PROTONIX) EC tablet 40 mg  40 mg Oral Daily Harrie Foreman, MD   40 mg at 02/10/17 1015  . phenylephrine (NEO-SYNEPHRINE) 10 mg in sodium chloride 0.9 % 250 mL (0.04 mg/mL) infusion  0-400 mcg/min Intravenous Titrated Awilda Bill, NP   Stopped at 02/10/17 1100  . polyethylene glycol (MIRALAX / GLYCOLAX) packet 17 g  17 g Oral Daily Harrie Foreman, MD   17 g at 02/10/17 1000  . QUEtiapine (SEROQUEL) tablet 100 mg  100 mg Oral QHS Jaunita Mikels T, MD      . rOPINIRole (REQUIP) tablet 2 mg  2 mg Oral QHS Harrie Foreman, MD      . rosuvastatin (CRESTOR) tablet 20 mg  20 mg Oral QHS Harrie Foreman, MD      . senna-docusate (Senokot-S) tablet 1 tablet  1 tablet Oral BID Harrie Foreman, MD   1 tablet at 02/10/17 1015    Musculoskeletal: Strength & Muscle Tone: decreased Gait & Station: unable to stand Patient leans: Backward  Psychiatric Specialty Exam: Physical Exam  Nursing note and vitals reviewed. Constitutional: He appears well-developed. He appears distressed.  HENT:  Head: Normocephalic and atraumatic.  Eyes: Pupils are equal, round, and reactive  to light. Conjunctivae are normal.  Neck: Normal range of motion.  Cardiovascular: Regular rhythm and normal heart sounds.   Respiratory: Effort normal. No respiratory distress.  GI: Soft.  Musculoskeletal: Normal range of motion.  Neurological: He is alert.  Skin: Skin is warm and dry.  Psychiatric: Judgment normal. His affect is blunt. His speech is delayed.  He is slowed. Thought content is not paranoid and not delusional. Cognition and memory are normal. He expresses no homicidal and no suicidal ideation.    Review of Systems  HENT: Negative.   Eyes: Negative.   Respiratory: Negative.   Cardiovascular: Negative.   Gastrointestinal: Negative.   Musculoskeletal: Positive for falls.  Skin: Negative.   Neurological: Positive for weakness.  Psychiatric/Behavioral: Positive for hallucinations. Negative for depression, memory loss, substance abuse and suicidal ideas. The patient is not nervous/anxious and does not have insomnia.     Blood pressure 112/63, pulse 66, temperature 98.1 F (36.7 C), temperature source Oral, resp. rate 11, height 5' 9"  (1.753 m), weight 104.3 kg (230 lb), SpO2 98 %.Body mass index is 33.97 kg/m.  General Appearance: Casual  Eye Contact:  Fair  Speech:  Slow  Volume:  Decreased  Mood:  Dysphoric  Affect:  Constricted  Thought Process:  Goal Directed  Orientation:  Full (Time, Place, and Person)  Thought Content:  Logical and Hallucinations: Auditory  Suicidal Thoughts:  No  Homicidal Thoughts:  No  Memory:  Immediate;   Fair Recent;   Fair Remote;   Fair  Judgement:  Fair  Insight:  Fair  Psychomotor Activity:  Decreased  Concentration:  Concentration: Fair  Recall:  [employer] of Knowledge:  Fair  Language:  Fair  Akathisia:  No  Handed:  Right  AIMS (if indicated):     Assets:  Desire for Improvement Housing  ADL's:  Impaired  Cognition:  Impaired,  Mild  Sleep:        Treatment Plan Summary: Medication management and Plan 56 year old  man who is acutely sick with leukemia and possible side effects of his chemotherapy as well as possible GI bleed. Currently in the ICU. Question about appropriate psychiatric medicine management. He is not expressing any acute psychiatric symptoms and his current medicines have been stable for quite a while. On the other hand his renal failure is getting worse. When I saw him earlier today I did not see a recent check of his hepatic function. I did see that from his EKG for QTc interval is very extended period to be on the safe side I have made several acute changes. I have discontinue the Depakote for now in order to level as well as an ammonia level. I have discontinued his Ingrezza. This is a fairly new medication that is used to try to counteract tardive dyskinesia but it has a risk of extending the QT interval as well. I have cut down on his Seroquel and held the Prolixin for now. Pending some lab results and some clarification of his medical situation we may then reinstate some of his medicines. For now I tried to eliminate anything that could be worsening his safety although I don't want him to go into a psychotic state either. I will continue to follow-up.  Disposition: No evidence of imminent risk to self or others at present.   Patient does not meet criteria for psychiatric inpatient admission.  Alethia Berthold, MD 02/10/2017 4:28 PM

## 2017-02-10 NOTE — Progress Notes (Signed)
Provider notified of Low B, will pt on Neo gtt as soon med is received from pharmacy.

## 2017-02-10 NOTE — ED Notes (Signed)
Report off to sherie rn 

## 2017-02-10 NOTE — ED Notes (Signed)
MS aware of pt status

## 2017-02-10 NOTE — Consult Note (Signed)
Name: Lawrence Johnston MRN: [id number] DOB: [birth year]    ADMISSION DATE:  02/10/2017 CONSULTATION DATE: 02/10/2017  REFERRING MD : Dr. Marcille Blanco   CHIEF COMPLAINT: Weakness   BRIEF PATIENT DESCRIPTION:  56 yo male admitted 10/10 due to symptomatic anemia with hypotension   SIGNIFICANT EVENTS  10/10-Pt admitted to stepdown unit   STUDIES:  CT Head 10/10>>No evidence of traumatic intracranial injury or fracture. Mild cortical volume loss noted.  HISTORY OF PRESENT ILLNESS:   This is a 56 yo male with a PMH of Tremors, OSA, Schizophrenia, HTN, Hypercholesteremia, GERD, Dysrhythmias, Diabetes Mellitus, and Depression.  He presented to [facility] ER on 10/10 with following a fall at [facility] and intermittent dizziness.  Due to symptoms he notified EMS and upon their arrival his blood pressure was 90/40, therefore he was given iv fluids with improvement of blood pressure.  He was recently diagnosed with CML and started on po chemotherapy.  His chemotherapy medications were adjusted due to persistent pancytopenia.  He also endorsed bilateral lower extremity pain.  In the ER lab results revealed creatinine 4.04, lactic acid 5.0, wbc 2.3, hgb 6.9, platelets 59, and he was hypotensive. According to pt at times he's had black stools. Therefore, he was admitted to the stepdown unit for further workup and treatment PCCM consulted.  PAST MEDICAL HISTORY :   has a past medical history of Arrhythmia; Depression; Diabetes ([place]); Dysrhythmia; GERD (gastroesophageal reflux disease); Hypercholesteremia; Hypertension; Schizophrenia ([place]); Sleep apnea; and Tremor.  has a past surgical history that includes Cardioversion; Colonoscopy with propofol (N/A, 02/25/2015); and Colonoscopy with propofol (N/A, 04/12/2015). Prior to Admission medications   Medication Sig Start Date End Date Taking? Authorizing Provider  benztropine (COGENTIN) 0.5 MG tablet Take 0.5 mg by mouth 2 (two) times daily.   Yes [provider]  divalproex (DEPAKOTE ER) 500 MG 24 hr tablet Take 2,000 mg by mouth at bedtime.   Yes [provider]  FLUoxetine (PROZAC) 20 MG capsule Take 20 mg by mouth daily.   Yes [provider]  fluPHENAZine (PROLIXIN) 5 MG tablet Take 5 mg by mouth 2 (two) times daily.   Yes [provider]  furosemide (LASIX) 20 MG tablet Take 20 mg by mouth daily.   Yes [provider]  gabapentin (NEURONTIN) 300 MG capsule Take 300 mg by mouth at bedtime.    Yes [provider]  imatinib (GLEEVEC) 400 MG tablet Take 1 tablet (400 mg total) by mouth daily. Take with meals and large glass of water.Caution:Chemotherapy. 12/16/16  Yes Lloyd Huger, MD  INGREZZA 80 MG CAPS Take 80 mg by mouth daily.   Yes [provider]  LORazepam (ATIVAN) 0.5 MG tablet Take 0.5 mg by mouth 2 (two) times daily.   Yes [provider]  metFORMIN (GLUCOPHAGE) 500 MG tablet Take 500 mg by mouth 2 (two) times daily with a meal.   Yes [provider]  metoprolol succinate (TOPROL-XL) 25 MG 24 hr tablet Take 25 mg by mouth 2 (two) times daily.    Yes [provider]  Multiple Vitamins-Iron (MULTIVITAMINS WITH IRON) TABS tablet Take 1 tablet by mouth daily.   Yes [provider]  pantoprazole (PROTONIX) 40 MG tablet Take 40 mg by mouth daily.   Yes [provider]  polyethylene glycol (MIRALAX / GLYCOLAX) packet Take 17 g by mouth daily.   Yes [provider]  QUEtiapine (SEROQUEL) 300 MG tablet Take 300 mg by mouth at bedtime.  Yes [provider]  rOPINIRole (REQUIP) 2 MG tablet Take 2 mg by mouth at bedtime.   Yes [provider]  rosuvastatin (CRESTOR) 20 MG tablet Take 20 mg by mouth at bedtime.    Yes [provider]  senna-docusate (SENOKOT-S) 8.6-50 MG tablet Take 1 tablet by mouth 2 (two) times daily.    Yes [provider]  sitaGLIPtin (JANUVIA) 100 MG tablet Take 100 mg by  mouth daily.  08/19/16 08/19/17 Yes [provider]  allopurinol (ZYLOPRIM) 300 MG tablet Take 1 tablet (300 mg total) by mouth daily. Start treatment 1 week prior to initiating Tasigna. Patient not taking: Reported on 02/10/2017 03/24/16   Lloyd Huger, MD   No Known Allergies  FAMILY HISTORY:  family history is not on file. SOCIAL HISTORY:  reports that he has never smoked. He has never used smokeless tobacco. He reports that he does not drink alcohol or use drugs.  REVIEW OF SYSTEMS: Positives in BOLD  Constitutional: Negative for fever, chills, weight loss, malaise/fatigue and diaphoresis.  HENT: Negative for hearing loss, ear pain, nosebleeds, congestion, sore throat, neck pain, tinnitus and ear discharge.   Eyes: Negative for blurred vision, double vision, photophobia, pain, discharge and redness.  Respiratory: Negative for cough, hemoptysis, sputum production, shortness of breath, wheezing and stridor.   Cardiovascular: Negative for chest pain, palpitations, orthopnea, claudication, leg swelling and PND.  Gastrointestinal: heartburn, nausea, vomiting, abdominal pain, diarrhea, constipation, blood in stool and melena.  Genitourinary: Negative for dysuria, urgency, frequency, hematuria and flank pain.  Musculoskeletal: Negative for myalgias, back pain, joint pain and falls.  Skin: Negative for itching and rash.  Neurological: dizziness, tingling, tremors, sensory change, speech change, focal weakness, seizures, loss of consciousness, weakness and headaches.  Endo/Heme/Allergies: Negative for environmental allergies and polydipsia. Does not bruise/bleed easily.  SUBJECTIVE:  Pt states he still feels weak and dizzy at times.   VITAL SIGNS: Temp:  [98.2 F (36.8 C)] 98.2 F (36.8 C) (10/10 0207) Pulse Rate:  [76-78] 76 (10/10 0310) Resp:  [11-18] 12 (10/10 0355) BP: (71-96)/(30-83) 89/40 (10/10 0355) SpO2:  [92 %-97 %] 96 % (10/10 0310) Weight:  [104.3 kg (230 lb)]  104.3 kg (230 lb) (10/10 0208)  PHYSICAL EXAMINATION: General: well developed, well nourished Caucasian male, NAD Neuro: alert confused to situation at times, follows commands, PERRLA  HEENT: supple, no JVD Cardiovascular: nsr, s1s2, no M/R/G Lungs: clear throughout, even, non labored  Abdomen: +BS x4, soft, obese, non tender, non distended  Musculoskeletal: normal tone, moves all extremities, 2+ bilateral lower extremity edema  Skin: intact no rashes or lesions    Recent Labs Lab 02/10/17 0204  NA 140  K 4.1  CL 108  CO2 19*  BUN 43*  CREATININE 4.04*  GLUCOSE 141*    Recent Labs Lab 02/10/17 0204  HGB 6.9*  HCT 20.9*  WBC 2.3*  PLT 59*   Ct Head Wo Contrast  Result Date: 02/10/2017 CLINICAL DATA:  Acute onset of dizziness after fall. Generalized weakness. Initial encounter. EXAM: CT HEAD WITHOUT CONTRAST TECHNIQUE: Contiguous axial images were obtained from the base of the skull through the vertex without intravenous contrast. COMPARISON:  CT of the head performed 08/19/2015 FINDINGS: Brain: No evidence of acute infarction, hemorrhage, hydrocephalus, extra-axial collection or mass lesion/mass effect. Prominence of the ventricles and sulci reflects mild cortical volume loss. Mild cerebellar atrophy is noted. The brainstem and fourth ventricle are within normal limits. The basal ganglia are unremarkable in appearance. The cerebral  hemispheres demonstrate grossly normal gray-white differentiation. No mass effect or midline shift is seen. Vascular: No hyperdense vessel or unexpected calcification. Skull: There is no evidence of fracture; visualized osseous structures are unremarkable in appearance. Sinuses/Orbits: The visualized portions of the orbits are within normal limits. The paranasal sinuses and mastoid air cells are well-aerated. Other: No significant soft tissue abnormalities are seen. IMPRESSION: 1. No evidence of traumatic intracranial injury or fracture. 2. Mild cortical  volume loss noted. Electronically Signed   By: Garald Balding M.D.   On: 02/10/2017 03:15    ASSESSMENT / PLAN: Symptomatic anemia with hypotension CML  Leukopenia Thrombocytopenia   Acute on chronic renal failure  Lactic acidosis  P: Supplemental O2 to maintain O2 sats >92% Aggressive fluid resuscitation NS @125  ml/hr Maintain map >65 Trend CBC Monitor for s/sx of bleeding Transfuse for hgb <7 Will hold off on transfusing platelets for now unless pt develops signs of active bleeding  If wbc decreases below 2.0 will place on neutropenic precautions Trend BMP Replace electrolytes as indicated  Monitor UOP Trend lactic acid  Trend WBC and monitor fever curve UA pending  Continuous telemetry monitoring Hold all outpatient antihypertensive medications for now  Oncology consulted appreciate input  Marda Stalker, [place] Pager [phone] (please enter 7 digits) PCCM Consult Pager [phone] (please enter 7 digits)

## 2017-02-10 NOTE — ED Triage Notes (Signed)
Pt arrived via [facility] from [facility] home following fall in the shower. Pt states was in shower, lightheaded, legs buckled and fell to floor, unable to get self up. Pt recently dx with Leukemia and tx with chemo pill. Pt states hx same sx tonight, but can usually get self up. Pt denies injury or pain. Pt states continued dizziness.

## 2017-02-10 NOTE — Progress Notes (Signed)
Drowsy this am. More alert this afternoon.Oriented x4 and appropriately answer questions. No observed  Hallucinations today.  Has his "normal" hand tremors.  Total 2 units PRBC's transfused since in ICU. Neosynephrine gtt was on for 4 hrs. No c/o pain. No evidence of bleeding. Foley cath was inserted for acute urinary retention. Good uop. Eating most of food off dinner and lunch trays. Blood sugars WNL. Trajenta was held.  Seen by Dr Weber Cooks , Dr Maretta Los, Dr Grayland Ormond and Dr Mortimer Fries. His mother has called twice today to check on his condition.

## 2017-02-10 NOTE — H&P (Addendum)
Lawrence Johnston is an 56 y.o. male.   Chief Complaint: Weakness HPI: The patient with past medical history of CML, hypertension, schizophrenia and atrial fibrillation presents emergency department after a fall. The patient states that he is weak and that his legs feel like rubber. Upon arrival the patient was hypotensive with systolic pressures in the high 70s and low 80s. Laboratory evaluation revealed pancytopenia and acute kidney injury. 2 units of red blood cells were ordered for transfusion. Once the patient was stable emergency department staff called the hospitalist service for admission.  Past Medical History:  Diagnosis Date  . Arrhythmia    Atrial Fibrillation; Atrial Flutter w/RVR; SVT  . Depression   . Diabetes ([place])   . Dysrhythmia   . GERD (gastroesophageal reflux disease)   . Hypercholesteremia   . Hypertension   . Schizophrenia ([place])   . Sleep apnea   . Tremor     Past Surgical History:  Procedure Laterality Date  . CARDIOVERSION     x2, a-fib  . COLONOSCOPY WITH PROPOFOL N/A 02/25/2015   Procedure: COLONOSCOPY WITH PROPOFOL;  Surgeon: Josefine Class, MD;  Location: [facility] ENDOSCOPY;  Service: Endoscopy;  Laterality: N/A;  . COLONOSCOPY WITH PROPOFOL N/A 04/12/2015   Procedure: COLONOSCOPY WITH PROPOFOL;  Surgeon: Josefine Class, MD;  Location: [facility] ENDOSCOPY;  Service: Endoscopy;  Laterality: N/A;    No family history on file. Social History:  reports that he has never smoked. He has never used smokeless tobacco. He reports that he does not drink alcohol or use drugs.  Allergies: No Known Allergies  Medications Prior to Admission  Medication Sig Dispense Refill  . benztropine (COGENTIN) 0.5 MG tablet Take 0.5 mg by mouth 2 (two) times daily.    . divalproex (DEPAKOTE ER) 500 MG 24 hr tablet Take 2,000 mg by mouth at bedtime.    Marland Kitchen FLUoxetine (PROZAC) 20 MG capsule Take 20 mg by mouth daily.    . fluPHENAZine (PROLIXIN) 5 MG tablet Take 5 mg by mouth 2  (two) times daily.    . furosemide (LASIX) 20 MG tablet Take 20 mg by mouth daily.    Marland Kitchen gabapentin (NEURONTIN) 300 MG capsule Take 300 mg by mouth at bedtime.     Marland Kitchen imatinib (GLEEVEC) 400 MG tablet Take 1 tablet (400 mg total) by mouth daily. Take with meals and large glass of water.Caution:Chemotherapy. 30 tablet 5  . INGREZZA 80 MG CAPS Take 80 mg by mouth daily.    Marland Kitchen LORazepam (ATIVAN) 0.5 MG tablet Take 0.5 mg by mouth 2 (two) times daily.    . metFORMIN (GLUCOPHAGE) 500 MG tablet Take 500 mg by mouth 2 (two) times daily with a meal.    . metoprolol succinate (TOPROL-XL) 25 MG 24 hr tablet Take 25 mg by mouth 2 (two) times daily.     . Multiple Vitamins-Iron (MULTIVITAMINS WITH IRON) TABS tablet Take 1 tablet by mouth daily.    . pantoprazole (PROTONIX) 40 MG tablet Take 40 mg by mouth daily.    . polyethylene glycol (MIRALAX / GLYCOLAX) packet Take 17 g by mouth daily.    . QUEtiapine (SEROQUEL) 300 MG tablet Take 300 mg by mouth at bedtime.    Marland Kitchen rOPINIRole (REQUIP) 2 MG tablet Take 2 mg by mouth at bedtime.    . rosuvastatin (CRESTOR) 20 MG tablet Take 20 mg by mouth at bedtime.     . senna-docusate (SENOKOT-S) 8.6-50 MG tablet Take 1 tablet by mouth 2 (two) times daily.     Marland Kitchen  sitaGLIPtin (JANUVIA) 100 MG tablet Take 100 mg by mouth daily.     Marland Kitchen allopurinol (ZYLOPRIM) 300 MG tablet Take 1 tablet (300 mg total) by mouth daily. Start treatment 1 week prior to initiating Tasigna. (Patient not taking: Reported on 02/10/2017) 30 tablet 2    Results for orders placed or performed during the hospital encounter of 02/10/17 (from the past 48 hour(s))  CBC     Status: Abnormal   Collection Time: 02/10/17  2:04 AM  Result Value Ref Range   WBC 2.3 (L) 3.8 - 10.6 K/uL   RBC 2.08 (L) 4.40 - 5.90 MIL/uL   Hemoglobin 6.9 (L) 13.0 - 18.0 g/dL   HCT 20.9 (L) 40.0 - 52.0 %   MCV 100.6 (H) 80.0 - 100.0 fL   MCH 33.1 26.0 - 34.0 pg   MCHC 32.9 32.0 - 36.0 g/dL   RDW 20.0 (H) 11.5 - 14.5 %    Platelets 59 (L) 150 - 440 K/uL  Basic metabolic panel     Status: Abnormal   Collection Time: 02/10/17  2:04 AM  Result Value Ref Range   Sodium 140 135 - 145 mmol/L   Potassium 4.1 3.5 - 5.1 mmol/L   Chloride 108 101 - 111 mmol/L   CO2 19 (L) 22 - 32 mmol/L   Glucose, Bld 141 (H) 65 - 99 mg/dL   BUN 43 (H) 6 - 20 mg/dL   Creatinine, Ser 4.04 (H) 0.61 - 1.24 mg/dL   Calcium 7.7 (L) 8.9 - 10.3 mg/dL   GFR calc non Af Amer 15 (L) >60 mL/min   GFR calc Af Amer 18 (L) >60 mL/min    Comment: (NOTE) The eGFR has been calculated using the CKD EPI equation. This calculation has not been validated in all clinical situations. eGFR's persistently <60 mL/min signify possible Chronic Kidney Disease.    Anion gap 13 5 - 15  Troponin I     Status: None   Collection Time: 02/10/17  2:04 AM  Result Value Ref Range   Troponin I <0.03 <0.03 ng/mL  Lactic acid, plasma     Status: Abnormal   Collection Time: 02/10/17  2:04 AM  Result Value Ref Range   Lactic Acid, Venous 5.0 (HH) 0.5 - 1.9 mmol/L    Comment: CRITICAL RESULT CALLED TO, READ BACK BY AND VERIFIED WITH AMY COYNE @ 7619 ON 02/10/2017 BY CAF   ABO/Rh     Status: None   Collection Time: 02/10/17  2:04 AM  Result Value Ref Range   ABO/RH(D) O POS   TSH     Status: None   Collection Time: 02/10/17  2:04 AM  Result Value Ref Range   TSH 2.208 0.350 - 4.500 uIU/mL    Comment: Performed by a 3rd Generation assay with a functional sensitivity of <=0.01 uIU/mL.  Type and screen [place]     Status: None (Preliminary result)   Collection Time: 02/10/17  2:56 AM  Result Value Ref Range   ABO/RH(D) O POS    Antibody Screen NEG    Sample Expiration 02/13/2017    Unit Number [id number]    Blood Component Type RED CELLS,LR    Unit division 00    Status of Unit ALLOCATED    Transfusion Status OK TO TRANSFUSE    Crossmatch Result Compatible    Unit Number [id number]    Blood Component Type RED CELLS,LR     Unit division 00    Status of Unit ISSUED  Transfusion Status OK TO TRANSFUSE    Crossmatch Result Compatible   Prepare RBC     Status: None   Collection Time: 02/10/17  2:56 AM  Result Value Ref Range   Order Confirmation ORDER PROCESSED BY BLOOD BANK   MRSA PCR Screening     Status: None   Collection Time: 02/10/17  4:54 AM  Result Value Ref Range   MRSA by PCR NEGATIVE NEGATIVE    Comment:        The GeneXpert MRSA Assay (FDA approved for NASAL specimens only), is one component of a comprehensive MRSA colonization surveillance program. It is not intended to diagnose MRSA infection nor to guide or monitor treatment for MRSA infections.   Glucose, capillary     Status: Abnormal   Collection Time: 02/10/17  4:55 AM  Result Value Ref Range   Glucose-Capillary 108 (H) 65 - 99 mg/dL  Lactic acid, plasma     Status: None   Collection Time: 02/10/17  5:30 AM  Result Value Ref Range   Lactic Acid, Venous 1.8 0.5 - 1.9 mmol/L   Ct Abdomen Pelvis Wo Contrast  Result Date: 02/10/2017 CLINICAL DATA:  Acute onset of dizziness. Status post fall in shower, with generalized weakness. Hypotension. Initial encounter. EXAM: CT ABDOMEN AND PELVIS WITHOUT CONTRAST TECHNIQUE: Multidetector CT imaging of the abdomen and pelvis was performed following the standard protocol without IV contrast. COMPARISON:  None. FINDINGS: Lower chest: Mild bibasilar atelectasis is noted. The visualized portions of the mediastinum are unremarkable. Hepatobiliary: The liver is grossly unremarkable in appearance. The gallbladder is grossly unremarkable. The common bile duct remains normal in caliber. Pancreas: The pancreas is within normal limits. Spleen: The spleen is enlarged, measuring 17.7 cm in length. Adrenals/Urinary Tract: The adrenal glands are grossly unremarkable appearance. The kidneys are grossly unremarkable in appearance. There is no evidence of hydronephrosis. No renal or ureteral stones are identified.  Nonspecific perinephric stranding is noted bilaterally. Stomach/Bowel: The stomach is unremarkable in appearance. The small bowel is within normal limits. The appendix is not visualized; there is no evidence for appendicitis. The colon is unremarkable in appearance. Vascular/Lymphatic: The abdominal aorta is unremarkable in appearance. The inferior vena cava is grossly unremarkable. Visualized retroperitoneal nodes remain normal in size. No pelvic sidewall lymphadenopathy is identified. Reproductive: The bladder is mildly distended and grossly unremarkable. The prostate remains normal in size, with scattered calcifications. Other: Small to moderate volume ascites is noted within the abdomen and pelvis. Soft tissue edema along the abdominal wall raises concern for mild anasarca. Musculoskeletal: No acute osseous abnormalities are identified. Vacuum phenomenon is noted at L1-L2. Chronic bilateral pars defects are seen at L5, without evidence of anterolisthesis. The visualized musculature is unremarkable in appearance. IMPRESSION: 1. No acute abnormality seen to explain the patient's symptoms. 2. Small to moderate volume ascites noted within the abdomen and pelvis. 3. Splenomegaly. 4. Soft tissue edema along the abdominal wall raises concern for mild anasarca. 5. Mild bibasilar atelectasis. 6. Chronic bilateral pars defects at L5, without evidence of anterolisthesis. Electronically Signed   By: Garald Balding M.D.   On: 02/10/2017 05:01   Ct Head Wo Contrast  Result Date: 02/10/2017 CLINICAL DATA:  Acute onset of dizziness after fall. Generalized weakness. Initial encounter. EXAM: CT HEAD WITHOUT CONTRAST TECHNIQUE: Contiguous axial images were obtained from the base of the skull through the vertex without intravenous contrast. COMPARISON:  CT of the head performed 08/19/2015 FINDINGS: Brain: No evidence of acute infarction, hemorrhage, hydrocephalus, extra-axial collection  or mass lesion/mass effect. Prominence  of the ventricles and sulci reflects mild cortical volume loss. Mild cerebellar atrophy is noted. The brainstem and fourth ventricle are within normal limits. The basal ganglia are unremarkable in appearance. The cerebral hemispheres demonstrate grossly normal gray-white differentiation. No mass effect or midline shift is seen. Vascular: No hyperdense vessel or unexpected calcification. Skull: There is no evidence of fracture; visualized osseous structures are unremarkable in appearance. Sinuses/Orbits: The visualized portions of the orbits are within normal limits. The paranasal sinuses and mastoid air cells are well-aerated. Other: No significant soft tissue abnormalities are seen. IMPRESSION: 1. No evidence of traumatic intracranial injury or fracture. 2. Mild cortical volume loss noted. Electronically Signed   By: Garald Balding M.D.   On: 02/10/2017 03:15    Review of Systems  Constitutional: Negative for chills and fever.  HENT: Negative for sore throat and tinnitus.   Eyes: Negative for blurred vision and redness.  Respiratory: Negative for cough and shortness of breath.   Cardiovascular: Negative for chest pain, palpitations, orthopnea and PND.  Gastrointestinal: Negative for abdominal pain, diarrhea, nausea and vomiting.  Genitourinary: Negative for dysuria, frequency and urgency.  Musculoskeletal: Negative for joint pain and myalgias.  Skin: Negative for rash.       No lesions  Neurological: Positive for weakness. Negative for speech change and focal weakness.  Endo/Heme/Allergies: Does not bruise/bleed easily.       No temperature intolerance  Psychiatric/Behavioral: Negative for depression and suicidal ideas.    Blood pressure 95/60, pulse 68, temperature 97.9 F (36.6 C), resp. rate (!) 9, height 5' 9"  (1.753 m), weight 104.3 kg (230 lb), SpO2 99 %. Physical Exam  Vitals reviewed. Constitutional: He is oriented to person, place, and time. He appears well-developed and  well-nourished. No distress.  HENT:  Head: Normocephalic and atraumatic.  Mouth/Throat: Oropharynx is clear and moist.  Eyes: Pupils are equal, round, and reactive to light. Conjunctivae and EOM are normal. No scleral icterus.  Neck: Normal range of motion. Neck supple. No JVD present. No tracheal deviation present. No thyromegaly present.  Cardiovascular: Normal rate, regular rhythm and normal heart sounds.  Exam reveals no gallop and no friction rub.   No murmur heard. GI: Soft. Bowel sounds are normal. He exhibits distension. He exhibits no mass. There is tenderness. There is no rebound and no guarding.  Genitourinary:  Genitourinary Comments: Deferred  Musculoskeletal: Normal range of motion. He exhibits no edema.  Lymphadenopathy:    He has no cervical adenopathy.  Neurological: He is alert and oriented to person, place, and time. No cranial nerve deficit.  Skin: Skin is warm and dry. No rash noted. No erythema. There is pallor.  Psychiatric: He has a normal mood and affect. His behavior is normal. Judgment and thought content normal.     Assessment/Plan This is a 56 year old male admitted for symptomatic anemia.   1. Hypotension:  Secondary to anemia due to chemotherapy/leukemia. Transfuse 2 units red blood cells.The patient has been third spacing. Likely secondary to decreased oncotic pressure. Continue to supplement intravascular volume. 2. Acute kidney injury: Acute on chronic; likely prerenal. Also check uric acid. Continue intravenous hydration. Avoid nephrotoxic agents 3. Abdominal pain: Distention; CT abdomen without contrast pending. Concerned the patient may also be bleeding. 4. CML: Pancytopenia; consult oncology as chemotherapy adjustments have been made recently that may be pertinent to the patient's plan of care 5. DVT prophylaxis: SCDs 6. GI prophylaxis: PPI IV every 12 hours The patient is a  full code. Time spent on admission orders and critical care approximately 45  minutes. Discussed with ICU attending.  Harrie Foreman, MD 02/10/2017, 7:54 AM

## 2017-02-10 NOTE — ED Notes (Signed)
Pt reports feeling dizzy after falling the shower tonight.  Pt denies chest pain or sob. Hx leukemia   Pt states I feel weak.  Pt denies injury from fall.  No abrasions or lacs noted.    Iv in place.  Iv fluids infusing.  Pt sleepy   siderails up x 2.

## 2017-02-10 NOTE — ED Notes (Signed)
Pt back from ct. Blood drawn and sent to the lab.

## 2017-02-11 LAB — BPAM RBC
Blood Product Expiration Date: 201810172359
Blood Product Expiration Date: 201810192359
ISSUE DATE / TIME: 201810100857
ISSUE DATE / TIME: 201810100501
UNIT TYPE AND RH: 5100
Unit Type and Rh: 5100

## 2017-02-11 LAB — TYPE AND SCREEN
ABO/RH(D): O POS
Antibody Screen: NEGATIVE
Unit division: 0
Unit division: 0

## 2017-02-11 LAB — GLUCOSE, CAPILLARY
Glucose-Capillary: 114 mg/dL — ABNORMAL HIGH (ref 65–99)
Glucose-Capillary: 128 mg/dL — ABNORMAL HIGH (ref 65–99)
Glucose-Capillary: 127 mg/dL — ABNORMAL HIGH (ref 65–99)
Glucose-Capillary: 122 mg/dL — ABNORMAL HIGH (ref 65–99)

## 2017-02-11 LAB — BASIC METABOLIC PANEL
Anion gap: 6 (ref 5–15)
BUN: 24 mg/dL — AB (ref 6–20)
CHLORIDE: 110 mmol/L (ref 101–111)
CO2: 24 mmol/L (ref 22–32)
CREATININE: 1.81 mg/dL — AB (ref 0.61–1.24)
Calcium: 8 mg/dL — ABNORMAL LOW (ref 8.9–10.3)
GFR calc Af Amer: 47 mL/min — ABNORMAL LOW (ref >60)
GFR calc non Af Amer: 40 mL/min — ABNORMAL LOW (ref >60)
GLUCOSE: 106 mg/dL — AB (ref 65–99)
Potassium: 3.8 mmol/L (ref 3.5–5.1)
Sodium: 140 mmol/L (ref 135–145)

## 2017-02-11 MED ORDER — OLANZAPINE 10 MG PO TABS
10.0000 mg | ORAL_TABLET | Freq: Every day | ORAL | Status: DC
  Administered 2017-02-11 – 2017-02-18 (×7): 10 mg via ORAL
  Filled 2017-02-11 (×4): qty 1
  Filled 2017-02-11 (×2): qty 2
  Filled 2017-02-11 (×2): qty 1

## 2017-02-11 MED ORDER — BENZTROPINE MESYLATE 0.5 MG PO TABS
0.5000 mg | ORAL_TABLET | Freq: Two times a day (BID) | ORAL | Status: DC
  Administered 2017-02-11 – 2017-02-19 (×15): 0.5 mg via ORAL
  Filled 2017-02-11 (×18): qty 1

## 2017-02-11 MED ORDER — DIVALPROEX SODIUM ER 500 MG PO TB24
2000.0000 mg | ORAL_TABLET | Freq: Every day | ORAL | Status: DC
  Administered 2017-02-11 – 2017-02-17 (×6): 2000 mg via ORAL
  Filled 2017-02-11 (×7): qty 4

## 2017-02-11 MED ORDER — CLONAZEPAM 0.5 MG PO TABS
0.5000 mg | ORAL_TABLET | Freq: Two times a day (BID) | ORAL | Status: DC
  Administered 2017-02-11 – 2017-02-19 (×14): 0.5 mg via ORAL
  Filled 2017-02-11 (×15): qty 1

## 2017-02-11 NOTE — Progress Notes (Signed)
Report given to [facility], RN who will receive pnt on 1A and assume pnt care.

## 2017-02-11 NOTE — NC FL2 (Signed)
[place] LEVEL OF CARE SCREENING TOOL     IDENTIFICATION  Patient Name: Lawrence Johnston Birthdate: 1960/08/09 Sex: male Admission Date (Current Location): 02/10/2017  [place] and [place] Number:  Engineering geologist and Address:  [facility], [address] [place], [place] [address]      Provider Number: [id number]  Attending Physician Name and Address:  Gladstone Lighter, MD  Relative Name and Phone Number:       Current Level of Care: Hospital Recommended Level of Care: Other (Comment) (Group Home) Prior Approval Number:    Date Approved/Denied:   PASRR Number:   [id number]  Discharge Plan: Other (Comment) (Group Home)    Current Diagnoses: Patient Active Problem List   Diagnosis Date Noted  . Symptomatic anemia 02/10/2017  . CML (chronic myeloid leukemia) ([place]) 03/22/2016  . MDS (myelodysplastic syndrome) ([place]) 03/11/2016  . 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 Schizophrenia ([place]) 06/19/2013    Orientation RESPIRATION BLADDER Height & Weight     Self, Time, Situation, Place  Normal Continent Weight: 230 lb (104.3 kg) Height:  5\' 9"  (175.3 cm)  BEHAVIORAL SYMPTOMS/MOOD NEUROLOGICAL BOWEL NUTRITION STATUS      Continent Diet (Heart Healthy)  AMBULATORY STATUS COMMUNICATION OF NEEDS Skin   Limited Assist Verbally Normal                       Personal Care Assistance Level of Assistance  Bathing, Feeding, Dressing Bathing Assistance: Limited assistance Feeding assistance: Independent Dressing Assistance: Limited assistance     Functional Limitations Info  Sight, Speech, Hearing Sight Info: Adequate Hearing Info: Adequate Speech Info: Adequate    SPECIAL CARE FACTORS FREQUENCY  PT (By licensed PT)     PT Frequency:  (2-3 Home Health)              Contractures      Additional Factors Info  Code Status,  Allergies Code Status Info:  (Full Code) Allergies Info:  (No Known Allergies)           Current Medications (02/11/2017):  This is the current hospital active medication list Current Facility-Administered Medications  Medication Dose Route Frequency Provider Last Rate Last Dose  . 0.9 %  sodium chloride infusion   Intravenous Continuous Wilhelmina Mcardle, MD   Stopped at 02/11/17 0016  . acetaminophen (TYLENOL) tablet 650 mg  650 mg Oral Q6H PRN Harrie Foreman, MD       Or  . acetaminophen (TYLENOL) suppository 650 mg  650 mg Rectal Q6H PRN Harrie Foreman, MD      . docusate sodium (COLACE) capsule 100 mg  100 mg Oral BID Harrie Foreman, MD   100 mg at 02/11/17 0859  . FLUoxetine (PROZAC) capsule 20 mg  20 mg Oral Daily Harrie Foreman, MD   20 mg at 02/11/17 0901  . gabapentin (NEURONTIN) capsule 300 mg  300 mg Oral QHS Harrie Foreman, MD   300 mg at 02/10/17 2224  . insulin aspart (novoLOG) injection 0-5 Units  0-5 Units Subcutaneous QHS Harrie Foreman, MD      . insulin aspart (novoLOG) injection 0-9 Units  0-9 Units Subcutaneous TID WC Harrie Foreman, MD      . LORazepam (ATIVAN) tablet 0.5 mg  0.5 mg Oral BID Harrie Foreman, MD   0.5 mg at 02/11/17 0859  . multivitamins with iron  tablet 1 tablet  1 tablet Oral Daily Harrie Foreman, MD   1 tablet at 02/11/17 0901  . OLANZapine (ZYPREXA) tablet 5 mg  5 mg Oral QHS Clapacs, Madie Reno, MD   5 mg at 02/10/17 2224  . ondansetron (ZOFRAN) tablet 4 mg  4 mg Oral Q6H PRN Harrie Foreman, MD       Or  . ondansetron [facility]) injection 4 mg  4 mg Intravenous Q6H PRN Harrie Foreman, MD      . pantoprazole (PROTONIX) EC tablet 40 mg  40 mg Oral Daily Harrie Foreman, MD   40 mg at 02/11/17 0900  . polyethylene glycol (MIRALAX / GLYCOLAX) packet 17 g  17 g Oral Daily Harrie Foreman, MD   17 g at 02/11/17 0900  . QUEtiapine (SEROQUEL) tablet 100 mg  100 mg Oral QHS Clapacs, John T, MD   100 mg at 02/10/17  2223  . rOPINIRole (REQUIP) tablet 2 mg  2 mg Oral QHS Harrie Foreman, MD   2 mg at 02/10/17 2225  . rosuvastatin (CRESTOR) tablet 20 mg  20 mg Oral QHS Harrie Foreman, MD   20 mg at 02/10/17 2223  . senna-docusate (Senokot-S) tablet 1 tablet  1 tablet Oral BID Harrie Foreman, MD   1 tablet at 02/11/17 0900     Discharge Medications: Please see discharge summary for a list of discharge medications.  Relevant Imaging Results:  Relevant Lab Results:   Additional Information  (SSN: [id number])  Smith Mince, Student-Social Work

## 2017-02-11 NOTE — Progress Notes (Signed)
Pt is a transfer for CCU. Pt alert and oriented. No complaints. Remaining on room air. Still with complaints of some weakness.

## 2017-02-11 NOTE — Clinical Social Work Note (Addendum)
Clinical Social Work Assessment  Patient Details  Name: Lawrence Johnston MRN: [id number] Date of Birth: 1961-04-26  Date of referral:  02/11/17               Reason for consult:  Discharge Planning                Permission sought to share information with:  Guardian Permission granted to share information::  Yes, Verbal Permission Granted  Name::      [place]::   [place]  Relationship::     Contact Information:     Housing/Transportation Living arrangements for the past 2 months:  [place] of Information:  Patient, Parent, Guardian, Facility Patient Interpreter Needed:  None Criminal Activity/Legal Involvement Pertinent to Current Situation/Hospitalization:  No - Comment as needed Significant Relationships:  Parents Lives with:  Facility Resident Do you feel safe going back to the place where you live?  Yes Need for family participation in patient care:  Yes (Comment)  Care giving concerns:  Patient is a resident at [facility] (group home) located at [address] [place], [place] [address] fax # [phone].    Social Worker assessment / plan:  Holiday representative (CSW) and Social work Theatre manager reviewed patient's chart and noted that he is from [facility]. Social work Theatre manager met with patient alone at bedside. Patient was alert and oriented x4. Social work Theatre manager introduced herself and explained the role of the [place]. Patient stated that he is from [facility] and has been a resident there for 10 years. Patient explained that his mother Lawrence Johnston ([phone]) is also his guardian. Patient is agreeable to go back to [facility] after discharge. Social work Theatre manager got in contact with his mother Lawrence Johnston to confirm above. Lawrence Johnston is agreeable for patient to D/C back to the group home.  CSW contacted group home and spoke to [place]. Per Lawrence Johnston patient has been a resident for 10 years and  walks without an assistive device at baseline. Per Lawrence Johnston patient's mother handles all his affairs. Per Lawrence Johnston patient can return to the group home. CSW left a voicemail for group home owner Lawrence Johnston. CSW will continue to follow and assist as needed.    Group home owner Lawrence Johnston called CSW back and stated that patient can return when stable. Per Lawrence Johnston patient is on chemo and takes a pill daily.   Employment status:  Disabled (Comment on whether or not currently receiving Disability) Insurance information:  Medicare PT Recommendations:  Not assessed at this time Information / Referral to community resources:     Patient/Family's Response to care:  Patient is agreeable to go back to [facility].  Patient/Family's Understanding of and Emotional Response to Diagnosis, Current Treatment, and Prognosis:  Patient and his mother Lawrence Johnston, were pleasant and thanked social work Theatre manager for her assistance.  Emotional Assessment Appearance:  Appears stated age Attitude/Demeanor/Rapport:    Affect (typically observed):  Calm, Pleasant, Stable Orientation:  Oriented to Self, Oriented to Place, Oriented to  Time, Oriented to Situation Alcohol / Substance use:  Not Applicable Psych involvement (Current and /or in the community):  Yes (Comment)  Discharge Needs  Concerns to be addressed:  Discharge Planning Concerns Readmission within the last 30 days:  No Current discharge risk:  Psychiatric Illness Barriers to Discharge:  Continued Medical Work up   Lawrence Johnston, Student-Social Work 02/11/2017, 10:11 AM

## 2017-02-11 NOTE — Progress Notes (Signed)
[facility] NAME: Lawrence Johnston    MR#:  [id number]  DATE OF BIRTH:  1960/06/13  SUBJECTIVE:  CHIEF COMPLAINT:   Chief Complaint  Patient presents with  . Weakness   - increased  tremors, dyskinesias -More alert today. Complaints of leg pain - BP better  REVIEW OF SYSTEMS:  Review of Systems  Constitutional: Positive for malaise/fatigue. Negative for chills and fever.  HENT: Negative for congestion, ear discharge, hearing loss and nosebleeds.   Eyes: Negative for blurred vision.  Respiratory: Negative for cough, shortness of breath and wheezing.   Cardiovascular: Positive for leg swelling. Negative for chest pain and palpitations.  Gastrointestinal: Negative for abdominal pain, constipation, diarrhea, nausea and vomiting.  Genitourinary: Negative for dysuria and urgency.  Musculoskeletal: Positive for myalgias.  Neurological: Positive for tremors and weakness. Negative for dizziness, speech change, focal weakness, seizures and headaches.  Psychiatric/Behavioral: Negative for depression.    DRUG ALLERGIES:  No Known Allergies  VITALS:  Blood pressure 117/64, pulse 77, temperature 98.4 F (36.9 C), temperature source Oral, resp. rate 17, height 5\' 9"  (1.753 m), weight 104.3 kg (230 lb), SpO2 99 %.  PHYSICAL EXAMINATION:  Physical Exam  GENERAL:  56 y.o.-year-old patient lying in the bed with no acute distress. Significant dyskinesias noted around the lip, tremors of hands and legs EYES: Pupils equal, round, reactive to light and accommodation. No scleral icterus. Extraocular muscles intact. Pale conjunctiva HEENT: Head atraumatic, normocephalic. Oropharynx and nasopharynx clear.  NECK:  Supple, no jugular venous distention. No thyroid enlargement, no tenderness.  LUNGS: Normal breath sounds bilaterally, no wheezing, rales,rhonchi or crepitation. No use of accessory muscles of respiration. Decreased bibasilar breath  sounds noted CARDIOVASCULAR: S1, S2 normal. No  rubs, or gallops. 2/6 systolic murmur present ABDOMEN: Soft, nontender, nondistended. Bowel sounds present. No organomegaly or mass.  EXTREMITIES: No cyanosis, or clubbing. 2+ bilateral pedal edema NEUROLOGIC: Cranial nerves II through XII are intact. Muscle strength 5/5 in all extremities. Global weakness present. Sensation intact. Gait not checked.  PSYCHIATRIC: The patient is alert and oriented x 3.  SKIN: No obvious rash, lesion, or ulcer.    LABORATORY PANEL:   CBC  Recent Labs Lab 02/10/17 2130  WBC 2.7*  HGB 9.4*  HCT 28.0*  PLT 60*   ------------------------------------------------------------------------------------------------------------------  Chemistries   Recent Labs Lab 02/10/17 1256 02/11/17 1155  NA  --  140  K  --  3.8  CL  --  110  CO2  --  24  GLUCOSE  --  106*  BUN  --  24*  CREATININE  --  1.81*  CALCIUM  --  8.0*  AST 89*  --   ALT 20  --   ALKPHOS 50  --   BILITOT 1.2  --    ------------------------------------------------------------------------------------------------------------------  Cardiac Enzymes  Recent Labs Lab 02/10/17 0204  TROPONINI <0.03   ------------------------------------------------------------------------------------------------------------------  RADIOLOGY:  Ct Abdomen Pelvis Wo Contrast  Result Date: 02/10/2017 CLINICAL DATA:  Acute onset of dizziness. Status post fall in shower, with generalized weakness. Hypotension. Initial encounter. EXAM: CT ABDOMEN AND PELVIS WITHOUT CONTRAST TECHNIQUE: Multidetector CT imaging of the abdomen and pelvis was performed following the standard protocol without IV contrast. COMPARISON:  None. FINDINGS: Lower chest: Mild bibasilar atelectasis is noted. The visualized portions of the mediastinum are unremarkable. Hepatobiliary: The liver is grossly unremarkable in appearance. The gallbladder is grossly unremarkable. The common bile  duct remains normal in caliber. Pancreas: The  pancreas is within normal limits. Spleen: The spleen is enlarged, measuring 17.7 cm in length. Adrenals/Urinary Tract: The adrenal glands are grossly unremarkable appearance. The kidneys are grossly unremarkable in appearance. There is no evidence of hydronephrosis. No renal or ureteral stones are identified. Nonspecific perinephric stranding is noted bilaterally. Stomach/Bowel: The stomach is unremarkable in appearance. The small bowel is within normal limits. The appendix is not visualized; there is no evidence for appendicitis. The colon is unremarkable in appearance. Vascular/Lymphatic: The abdominal aorta is unremarkable in appearance. The inferior vena cava is grossly unremarkable. Visualized retroperitoneal nodes remain normal in size. No pelvic sidewall lymphadenopathy is identified. Reproductive: The bladder is mildly distended and grossly unremarkable. The prostate remains normal in size, with scattered calcifications. Other: Small to moderate volume ascites is noted within the abdomen and pelvis. Soft tissue edema along the abdominal wall raises concern for mild anasarca. Musculoskeletal: No acute osseous abnormalities are identified. Vacuum phenomenon is noted at L1-L2. Chronic bilateral pars defects are seen at L5, without evidence of anterolisthesis. The visualized musculature is unremarkable in appearance. IMPRESSION: 1. No acute abnormality seen to explain the patient's symptoms. 2. Small to moderate volume ascites noted within the abdomen and pelvis. 3. Splenomegaly. 4. Soft tissue edema along the abdominal wall raises concern for mild anasarca. 5. Mild bibasilar atelectasis. 6. Chronic bilateral pars defects at L5, without evidence of anterolisthesis. Electronically Signed   By: Garald Balding M.D.   On: 02/10/2017 05:01   Ct Head Wo Contrast  Result Date: 02/10/2017 CLINICAL DATA:  Acute onset of dizziness after fall. Generalized weakness. Initial  encounter. EXAM: CT HEAD WITHOUT CONTRAST TECHNIQUE: Contiguous axial images were obtained from the base of the skull through the vertex without intravenous contrast. COMPARISON:  CT of the head performed 08/19/2015 FINDINGS: Brain: No evidence of acute infarction, hemorrhage, hydrocephalus, extra-axial collection or mass lesion/mass effect. Prominence of the ventricles and sulci reflects mild cortical volume loss. Mild cerebellar atrophy is noted. The brainstem and fourth ventricle are within normal limits. The basal ganglia are unremarkable in appearance. The cerebral hemispheres demonstrate grossly normal gray-white differentiation. No mass effect or midline shift is seen. Vascular: No hyperdense vessel or unexpected calcification. Skull: There is no evidence of fracture; visualized osseous structures are unremarkable in appearance. Sinuses/Orbits: The visualized portions of the orbits are within normal limits. The paranasal sinuses and mastoid air cells are well-aerated. Other: No significant soft tissue abnormalities are seen. IMPRESSION: 1. No evidence of traumatic intracranial injury or fracture. 2. Mild cortical volume loss noted. Electronically Signed   By: Garald Balding M.D.   On: 02/10/2017 03:15    EKG:   Orders placed or performed during the hospital encounter of 02/10/17  . EKG 12-Lead  . EKG 12-Lead  . EKG 12-Lead  . 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 -Hemoglobin noted to be at 6.9. No evidence of infection noted.. Blood cultures are pending -2 units packed RBC transfused and admission -No active bleeding noted, could be secondary to [place] versus worsening CML -Stool for guaiac was positive. on  Protonix  #2 acute renal failure-acute on chronic kidney disease, renal function worsened from creatinine of 1.5 -no obstruction noted on CT -Nephrology consult recommended -IV fluids and avoid nephrotoxins- improved cr to 1.8  today  #3 pancytopenia-secondary to worsening CML versus restarted on Gleevec about 6 weeks ago for symptoms have been persistent for almost 4 weeks now. -Hold Gleevec -Support with transfusion-received 2 units of packed RBC yesterday -Currently no indication for platelet transfusion at this time unless there is active bleeding or platelets less than 20 K -Oncology consulted  #4 schizophrenia disorder- continue home medications. Patient on Prozac, Depakote, Cogentin, Seroquel Appreciate psych consult  #5 diabetes mellitus-on sliding scale insulin  #6 DVT prophylaxis-Ted's and SCDs at this time    All the records are reviewed and case discussed with Care Management/Social Workerr. Management plans discussed with the patient, family and they are in agreement.  CODE STATUS: Full Code  TOTAL TIME TAKING CARE OF THIS PATIENT: 37 minutes.   POSSIBLE D/C IN 2-3 DAYS, DEPENDING ON CLINICAL CONDITION.   Onedia Vargus M.D on 02/11/2017 at 1:09 PM  Between 7am to 6pm - Pager - [phone]  After 6pm go to [email or web address] - password [email or web address] [facility]  Office  [phone]  CC: Primary care physician; Idelle Crouch, MD

## 2017-02-11 NOTE — Consult Note (Signed)
[facility] Consult   Reason for Consult:  Consult for 56 year old man with a history of schizophrenia or schizoaffective disorder who is currently in the hospital for weakness and anemia Referring Physician:  Tressia Miners Patient Identification: Lawrence Johnston MRN:  [id number] Principal Diagnosis: Schizophrenia [facility]) Diagnosis:   Patient Active Problem List   Diagnosis Date Noted  . Symptomatic anemia [D64.9] 02/10/2017  . 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 Schizophrenia ([place]) [F20.9] 06/19/2013    Total Time spent with patient: 30 minutes  Subjective:   Lawrence Johnston is a 56 y.o. male patient admitted with "I had a fall".  Follow-up note for 56 year old man with schizoaffective disorder currently in the hospital with multiple medical problems largely related to his new leukemia. On interview today found the patient awake and conversant. He said he was emotionally feeling okay. Still has vague hallucinations but is not bothered by them. Denied any suicidal thoughts. Affect is blunted but in part it is hard to tell because his tremor was much worse. The degree of shaking he has comes and goes. Not all of it I think is tardive dyskinesia but he does have significant tardive dyskinesia and it may be worse today than what we were seeing yesterday. Review of his labs it looks like his EKG today shows a much better QTc interval than the one previously. The Depakote level came back low at 36 and he had no elevated ammonia level. He does have some hepatic impairment   HP renal someI:  Patient interviewed chart reviewed. 56 year old man with a history of chronic mental health problems came into the hospital with a recent fall along with generalized  weakness. In the emergency room he was found to be very anemic and do have significant worsening of his renal failure. Admitted to the intensive care unit. Consult for concerns about his psychiatric medicine. Patient has no specific psychiatric complaints. Says he is not necessarily feeling depressed. Sleep has been poor but not necessarily worse than usual. He has chronic auditory hallucinations but says they are not any worse than usual. Denies any acute suicidal or homicidal thoughts. He has been taking his medicine regularly. There is the obvious new stress of his recently diagnosed with leukemia  Social history: Lives in a group home. Not clear to me whether he has a guardian but I think he probably doesn't from what I can see.  Medical history: Has multiple medical problems. Has recently diagnosed leukemia. Acutely he has pancytopenia which has now been a recurrent problem over the last couple months. Possible GI bleed. Significant worsening of renal insufficiency. History of tardive dyskinesia.  Substance abuse history: Not drinking not using any drugs I don't think there is been a significant past history  Past Psychiatric History: [name] Last time was in the distant past. He has been maintained on his current combination of psychiatric medicines that includes Seroquel Depakote Prolixin for at least a few years without any significant change.  Risk to Self: Is patient at risk for suicide?: No Risk to Others:   Prior Inpatient Therapy:   Prior Outpatient Therapy:    Past Medical History:  Past Medical History:  Diagnosis Date  . Arrhythmia    Atrial Fibrillation; Atrial Flutter  w/RVR; SVT  . Depression   . Diabetes (HCC)   . Dysrhythmia   . GERD (gastroesophageal reflux disease)   . Hypercholesteremia   . Hypertension   . Schizophrenia (HCC)   . Sleep apnea   . Tremor     Past  Surgical History:  Procedure Laterality Date  . CARDIOVERSION     x2, a-fib  . COLONOSCOPY WITH PROPOFOL N/A 02/25/2015   Procedure: COLONOSCOPY WITH PROPOFOL;  Surgeon: Elnita Maxwell, MD;  Location: [facility] ENDOSCOPY;  Service: Endoscopy;  Laterality: N/A;  . COLONOSCOPY WITH PROPOFOL N/A 04/12/2015   Procedure: COLONOSCOPY WITH PROPOFOL;  Surgeon: Elnita Maxwell, MD;  Location: [facility] ENDOSCOPY;  Service: Endoscopy;  Laterality: N/A;   Family History: No family history on file. Family Psychiatric  History: Positive for mood disorder Social History:  History  Alcohol Use No     History  Drug Use No    Social History   Social History  . Marital status: Single    Spouse name: N/A  . Number of children: N/A  . Years of education: N/A   Social History Main Topics  . Smoking status: Never Smoker  . Smokeless tobacco: Never Used  . Alcohol use No  . Drug use: No  . Sexual activity: Not Asked   Other Topics Concern  . None   Social History Narrative  . None   Additional Social History:    Allergies:  No Known Allergies  Labs:  Results for orders placed or performed during the hospital encounter of 02/10/17 (from the past 48 hour(s))  CBC     Status: Abnormal   Collection Time: 02/10/17  2:04 AM  Result Value Ref Range   WBC 2.3 (L) 3.8 - 10.6 K/uL   RBC 2.08 (L) 4.40 - 5.90 MIL/uL   Hemoglobin 6.9 (L) 13.0 - 18.0 g/dL   HCT 77.6 (L) 16.0 - 76.0 %   MCV 100.6 (H) 80.0 - 100.0 fL   MCH 33.1 26.0 - 34.0 pg   MCHC 32.9 32.0 - 36.0 g/dL   RDW 66.7 (H) 85.5 - 47.6 %   Platelets 59 (L) 150 - 440 K/uL  Basic metabolic panel     Status: Abnormal   Collection Time: 02/10/17  2:04 AM  Result Value Ref Range   Sodium 140 135 - 145 mmol/L   Potassium 4.1 3.5 - 5.1 mmol/L   Chloride 108 101 - 111 mmol/L   CO2 19 (L) 22 - 32 mmol/L   Glucose, Bld 141 (H) 65 - 99 mg/dL   BUN 43 (H) 6 - 20 mg/dL   Creatinine, Ser 8.91 (H) 0.61 - 1.24 mg/dL   Calcium 7.7 (L) 8.9 -  10.3 mg/dL   GFR calc non Af Amer 15 (L) >60 mL/min   GFR calc Af Amer 18 (L) >60 mL/min    Comment: (NOTE) The eGFR has been calculated using the CKD EPI equation. This calculation has not been validated in all clinical situations. eGFR's persistently <60 mL/min signify possible Chronic Kidney Disease.    Anion gap 13 5 - 15  Troponin I     Status: None   Collection Time: 02/10/17  2:04 AM  Result Value Ref Range   Troponin I <0.03 <0.03 ng/mL  Lactic acid, plasma     Status: Abnormal   Collection Time: 02/10/17  2:04 AM  Result Value Ref Range   Lactic Acid, Venous 5.0 (HH) 0.5 - 1.9 mmol/L    Comment: CRITICAL RESULT CALLED  TO, READ BACK BY AND VERIFIED WITH AMY COYNE @ 2637 ON 02/10/2017 BY CAF   ABO/Rh     Status: None   Collection Time: 02/10/17  2:04 AM  Result Value Ref Range   ABO/RH(D) O POS   TSH     Status: None   Collection Time: 02/10/17  2:04 AM  Result Value Ref Range   TSH 2.208 0.350 - 4.500 uIU/mL    Comment: Performed by a 3rd Generation assay with a functional sensitivity of <=0.01 uIU/mL.  Type and screen [place]     Status: None   Collection Time: 02/10/17  2:56 AM  Result Value Ref Range   ABO/RH(D) O POS    Antibody Screen NEG    Sample Expiration 02/13/2017    Unit Number [id number]    Blood Component Type RED CELLS,LR    Unit division 00    Status of Unit ISSUED,FINAL    Transfusion Status OK TO TRANSFUSE    Crossmatch Result Compatible    Unit Number [id number]    Blood Component Type RED CELLS,LR    Unit division 00    Status of Unit ISSUED,FINAL    Transfusion Status OK TO TRANSFUSE    Crossmatch Result Compatible   Prepare RBC     Status: None   Collection Time: 02/10/17  2:56 AM  Result Value Ref Range   Order Confirmation ORDER PROCESSED BY BLOOD BANK   MRSA PCR Screening     Status: None   Collection Time: 02/10/17  4:54 AM  Result Value Ref Range   MRSA by PCR NEGATIVE NEGATIVE    Comment:         The GeneXpert MRSA Assay (FDA approved for NASAL specimens only), is one component of a comprehensive MRSA colonization surveillance program. It is not intended to diagnose MRSA infection nor to guide or monitor treatment for MRSA infections.   Glucose, capillary     Status: Abnormal   Collection Time: 02/10/17  4:55 AM  Result Value Ref Range   Glucose-Capillary 108 (H) 65 - 99 mg/dL  Lactic acid, plasma     Status: None   Collection Time: 02/10/17  5:30 AM  Result Value Ref Range   Lactic Acid, Venous 1.8 0.5 - 1.9 mmol/L  Glucose, capillary     Status: None   Collection Time: 02/10/17  8:48 AM  Result Value Ref Range   Glucose-Capillary 97 65 - 99 mg/dL   Comment 1 Notify RN   Urinalysis, Complete w Microscopic     Status: Abnormal   Collection Time: 02/10/17 11:25 AM  Result Value Ref Range   Color, Urine YELLOW (A) YELLOW   APPearance CLEAR (A) CLEAR   Specific Gravity, Urine 1.017 1.005 - 1.030   pH 5.0 5.0 - 8.0   Glucose, UA NEGATIVE NEGATIVE mg/dL   Hgb urine dipstick NEGATIVE NEGATIVE   Bilirubin Urine NEGATIVE NEGATIVE   Ketones, ur NEGATIVE NEGATIVE mg/dL   Protein, ur NEGATIVE NEGATIVE mg/dL   Nitrite NEGATIVE NEGATIVE   Leukocytes, UA NEGATIVE NEGATIVE   RBC / HPF 0-5 0 - 5 RBC/hpf   WBC, UA 0-5 0 - 5 WBC/hpf   Bacteria, UA NONE SEEN NONE SEEN   Squamous Epithelial / LPF 0-5 (A) NONE SEEN   Mucus PRESENT    Hyaline Casts, UA PRESENT   Glucose, capillary     Status: None   Collection Time: 02/10/17 11:31 AM  Result Value Ref Range   Glucose-Capillary 89  65 - 99 mg/dL   Comment 1 Notify RN   Ammonia     Status: None   Collection Time: 02/10/17 12:49 PM  Result Value Ref Range   Ammonia 14 9 - 35 umol/L  Valproic acid level     Status: None   Collection Time: 02/10/17 12:56 PM  Result Value Ref Range   Valproic Acid Lvl 54 50.0 - 100.0 ug/mL  Hepatic function panel     Status: Abnormal   Collection Time: 02/10/17 12:56 PM  Result Value Ref Range    Total Protein 5.2 (L) 6.5 - 8.1 g/dL   Albumin 2.7 (L) 3.5 - 5.0 g/dL   AST 89 (H) 15 - 41 U/L   ALT 20 17 - 63 U/L   Alkaline Phosphatase 50 38 - 126 U/L   Total Bilirubin 1.2 0.3 - 1.2 mg/dL   Bilirubin, Direct 0.2 0.1 - 0.5 mg/dL   Indirect Bilirubin 1.0 (H) 0.3 - 0.9 mg/dL  Glucose, capillary     Status: Abnormal   Collection Time: 02/10/17  4:19 PM  Result Value Ref Range   Glucose-Capillary 119 (H) 65 - 99 mg/dL  Glucose, capillary     Status: Abnormal   Collection Time: 02/10/17  9:21 PM  Result Value Ref Range   Glucose-Capillary 109 (H) 65 - 99 mg/dL  CBC with Differential/Platelet     Status: Abnormal   Collection Time: 02/10/17  9:30 PM  Result Value Ref Range   WBC 2.7 (L) 3.8 - 10.6 K/uL   RBC 2.94 (L) 4.40 - 5.90 MIL/uL   Hemoglobin 9.4 (L) 13.0 - 18.0 g/dL   HCT 28.0 (L) 40.0 - 52.0 %   MCV 95.3 80.0 - 100.0 fL   MCH 32.0 26.0 - 34.0 pg   MCHC 33.6 32.0 - 36.0 g/dL   RDW 20.1 (H) 11.5 - 14.5 %   Platelets 60 (L) 150 - 440 K/uL   Neutrophils Relative % 61 %   Neutro Abs 1.7 1.4 - 6.5 K/uL   Lymphocytes Relative 17 %   Lymphs Abs 0.5 (L) 1.0 - 3.6 K/uL   Monocytes Relative 18 %   Monocytes Absolute 0.5 0.2 - 1.0 K/uL   Eosinophils Relative 3 %   Eosinophils Absolute 0.1 0 - 0.7 K/uL   Basophils Relative 1 %   Basophils Absolute 0.0 0 - 0.1 K/uL  Glucose, capillary     Status: Abnormal   Collection Time: 02/11/17  8:28 AM  Result Value Ref Range   Glucose-Capillary 114 (H) 65 - 99 mg/dL  Glucose, capillary     Status: Abnormal   Collection Time: 02/11/17 11:33 AM  Result Value Ref Range   Glucose-Capillary 128 (H) 65 - 99 mg/dL  Basic metabolic panel     Status: Abnormal   Collection Time: 02/11/17 11:55 AM  Result Value Ref Range   Sodium 140 135 - 145 mmol/L   Potassium 3.8 3.5 - 5.1 mmol/L   Chloride 110 101 - 111 mmol/L   CO2 24 22 - 32 mmol/L   Glucose, Bld 106 (H) 65 - 99 mg/dL   BUN 24 (H) 6 - 20 mg/dL   Creatinine, Ser 1.81 (H) 0.61 - 1.24  mg/dL   Calcium 8.0 (L) 8.9 - 10.3 mg/dL   GFR calc non Af Amer 40 (L) >60 mL/min   GFR calc Af Amer 47 (L) >60 mL/min    Comment: (NOTE) The eGFR has been calculated using the CKD EPI equation. This calculation has  not been validated in all clinical situations. eGFR's persistently <60 mL/min signify possible Chronic Kidney Disease.    Anion gap 6 5 - 15  Glucose, capillary     Status: Abnormal   Collection Time: 02/11/17  5:13 PM  Result Value Ref Range   Glucose-Capillary 127 (H) 65 - 99 mg/dL    Current Facility-Administered Medications  Medication Dose Route Frequency Provider Last Rate Last Dose  . 0.9 %  sodium chloride infusion   Intravenous Continuous Wilhelmina Mcardle, MD   Stopped at 02/11/17 0016  . acetaminophen (TYLENOL) tablet 650 mg  650 mg Oral Q6H PRN Harrie Foreman, MD       Or  . acetaminophen (TYLENOL) suppository 650 mg  650 mg Rectal Q6H PRN Harrie Foreman, MD      . benztropine (COGENTIN) tablet 0.5 mg  0.5 mg Oral BID Gladstone Lighter, MD   0.5 mg at 02/11/17 1430  . clonazePAM (KLONOPIN) tablet 0.5 mg  0.5 mg Oral BID Clapacs, John T, MD      . divalproex (DEPAKOTE ER) 24 hr tablet 2,000 mg  2,000 mg Oral QHS Clapacs, John T, MD      . docusate sodium (COLACE) capsule 100 mg  100 mg Oral BID Harrie Foreman, MD   100 mg at 02/11/17 0859  . FLUoxetine (PROZAC) capsule 20 mg  20 mg Oral Daily Harrie Foreman, MD   20 mg at 02/11/17 0901  . gabapentin (NEURONTIN) capsule 300 mg  300 mg Oral QHS Harrie Foreman, MD   300 mg at 02/10/17 2224  . insulin aspart (novoLOG) injection 0-5 Units  0-5 Units Subcutaneous QHS Harrie Foreman, MD      . insulin aspart (novoLOG) injection 0-9 Units  0-9 Units Subcutaneous TID WC Harrie Foreman, MD      . multivitamins with iron tablet 1 tablet  1 tablet Oral Daily Harrie Foreman, MD   1 tablet at 02/11/17 0901  . OLANZapine (ZYPREXA) tablet 10 mg  10 mg Oral QHS Clapacs, John T, MD      .  ondansetron [facility]) tablet 4 mg  4 mg Oral Q6H PRN Harrie Foreman, MD       Or  . ondansetron [facility]) injection 4 mg  4 mg Intravenous Q6H PRN Harrie Foreman, MD      . pantoprazole (PROTONIX) EC tablet 40 mg  40 mg Oral Daily Harrie Foreman, MD   40 mg at 02/11/17 0900  . polyethylene glycol (MIRALAX / GLYCOLAX) packet 17 g  17 g Oral Daily Harrie Foreman, MD   17 g at 02/11/17 0900  . QUEtiapine (SEROQUEL) tablet 100 mg  100 mg Oral QHS Clapacs, John T, MD   100 mg at 02/10/17 2223  . rOPINIRole (REQUIP) tablet 2 mg  2 mg Oral QHS Harrie Foreman, MD   2 mg at 02/10/17 2225  . rosuvastatin (CRESTOR) tablet 20 mg  20 mg Oral QHS Harrie Foreman, MD   20 mg at 02/10/17 2223  . senna-docusate (Senokot-S) tablet 1 tablet  1 tablet Oral BID Harrie Foreman, MD   1 tablet at 02/11/17 0900    Musculoskeletal: Strength & Muscle Tone: decreased Gait & Station: unable to stand Patient leans: Backward  Psychiatric Specialty Exam: Physical Exam  Nursing note and vitals reviewed. Constitutional: He appears well-developed. He appears distressed.  HENT:  Head: Normocephalic and atraumatic.  Eyes: Pupils are equal, round, and  reactive to light. Conjunctivae are normal.  Neck: Normal range of motion.  Cardiovascular: Regular rhythm and normal heart sounds.   Respiratory: Effort normal. No respiratory distress.  GI: Soft.  Musculoskeletal: Normal range of motion.  Neurological: He is alert. He displays tremor.  Tremor is much worse and all over his body today. He tells me that it comes and goes.  Skin: Skin is warm and dry.  Psychiatric: Judgment normal. His affect is blunt. His speech is delayed. He is slowed. Thought content is not paranoid and not delusional. Cognition and memory are normal. He expresses no homicidal and no suicidal ideation.    Review of Systems  HENT: Negative.   Eyes: Negative.   Respiratory: Negative.   Cardiovascular: Negative.    Gastrointestinal: Negative.   Musculoskeletal: Positive for falls.  Skin: Negative.   Neurological: Positive for tremors and weakness.  Psychiatric/Behavioral: Positive for hallucinations. Negative for depression, memory loss, substance abuse and suicidal ideas. The patient is not nervous/anxious and does not have insomnia.     Blood pressure 117/64, pulse 77, temperature 98.4 F (36.9 C), temperature source Oral, resp. rate 17, height _0  (1.753 m), weight 104.3 kg (230 lb), SpO2 99 %.Body mass index is 33.97 kg/m.  General Appearance: Casual  Eye Contact:  Fair  Speech:  Slow  Volume:  Decreased  Mood:  Dysphoric  Affect:  Constricted  Thought Process:  Goal Directed  Orientation:  Full (Time, Place, and Person)  Thought Content:  Logical and Hallucinations: Auditory  Suicidal Thoughts:  No  Homicidal Thoughts:  No  Memory:  Immediate;   Fair Recent;   Fair Remote;   Fair  Judgement:  Fair  Insight:  Fair  Psychomotor Activity:  Decreased  Concentration:  Concentration: Fair  Recall:  [employer] of Knowledge:  Fair  Language:  Fair  Akathisia:  No  Handed:  Right  AIMS (if indicated):     Assets:  Desire for Improvement Housing  ADL's:  Impaired  Cognition:  Impaired,  Mild  Sleep:        Treatment Plan Summary: Medication management and Plan Patient has no new complaints and does not appear to have decompensated mentally although his tremor is worse. Unclear how much of this is related to discontinuing his Ingrezza. QT interval is improved. I am going to restart the Depakote at 2000 mg at night as he was previously taking it since he seemed to be tolerating it well even with his liver impairment and his ammonia was not elevated. I am going to increase the Zyprexa to 10 mg at night while leaving the Seroquel low at 100 to try and best control his psychosis without worsening his heart problems. I am also going to change his Ativan to Klonopin a half milligram twice a  day for now to help control the tremor a little bit. I will sign out to the psychiatrist over the weekend.  Disposition: No evidence of imminent risk to self or others at present.   Patient does not meet criteria for psychiatric inpatient admission.  Alethia Berthold, MD 02/11/2017 7:05 PM

## 2017-02-12 LAB — CBC WITH DIFFERENTIAL/PLATELET
BASOS ABS: 0 10*3/uL (ref 0–0.1)
BASOS PCT: 1 %
Eosinophils Absolute: 0 10*3/uL (ref 0–0.7)
Eosinophils Relative: 1 %
HEMATOCRIT: 27.6 % — AB (ref 40.0–52.0)
HEMOGLOBIN: 9 g/dL — AB (ref 13.0–18.0)
LYMPHS PCT: 14 %
Lymphs Abs: 0.4 10*3/uL — ABNORMAL LOW (ref 1.0–3.6)
MCH: 31.4 pg (ref 26.0–34.0)
MCHC: 32.7 g/dL (ref 32.0–36.0)
MCV: 96 fL (ref 80.0–100.0)
MONOS PCT: 17 %
Monocytes Absolute: 0.4 10*3/uL (ref 0.2–1.0)
NEUTROS ABS: 1.7 10*3/uL (ref 1.4–6.5)
NEUTROS PCT: 67 %
Platelets: 42 10*3/uL — ABNORMAL LOW (ref 150–440)
RBC: 2.87 MIL/uL — ABNORMAL LOW (ref 4.40–5.90)
RDW: 19.5 % — ABNORMAL HIGH (ref 11.5–14.5)
WBC: 2.5 10*3/uL — ABNORMAL LOW (ref 3.8–10.6)

## 2017-02-12 LAB — GLUCOSE, CAPILLARY
Glucose-Capillary: 113 mg/dL — ABNORMAL HIGH (ref 65–99)
Glucose-Capillary: 123 mg/dL — ABNORMAL HIGH (ref 65–99)
Glucose-Capillary: 107 mg/dL — ABNORMAL HIGH (ref 65–99)
Glucose-Capillary: 128 mg/dL — ABNORMAL HIGH (ref 65–99)

## 2017-02-12 LAB — BASIC METABOLIC PANEL
ANION GAP: 7 (ref 5–15)
BUN: 19 mg/dL (ref 6–20)
CHLORIDE: 113 mmol/L — AB (ref 101–111)
CO2: 20 mmol/L — ABNORMAL LOW (ref 22–32)
Calcium: 7.8 mg/dL — ABNORMAL LOW (ref 8.9–10.3)
Creatinine, Ser: 1.6 mg/dL — ABNORMAL HIGH (ref 0.61–1.24)
GFR calc Af Amer: 54 mL/min — ABNORMAL LOW (ref >60)
GFR, EST NON AFRICAN AMERICAN: 47 mL/min — AB (ref >60)
GLUCOSE: 124 mg/dL — AB (ref 65–99)
POTASSIUM: 3.8 mmol/L (ref 3.5–5.1)
Sodium: 140 mmol/L (ref 135–145)

## 2017-02-12 MED ORDER — METOPROLOL SUCCINATE ER 25 MG PO TB24
25.0000 mg | ORAL_TABLET | Freq: Two times a day (BID) | ORAL | Status: DC
  Administered 2017-02-12 – 2017-02-15 (×5): 25 mg via ORAL
  Filled 2017-02-12 (×6): qty 1

## 2017-02-12 MED ORDER — QUETIAPINE FUMARATE 25 MG PO TABS
100.0000 mg | ORAL_TABLET | Freq: Every day | ORAL | Status: DC
  Administered 2017-02-13 – 2017-02-18 (×6): 100 mg via ORAL
  Filled 2017-02-12 (×6): qty 4

## 2017-02-12 NOTE — Care Management (Signed)
Physical therapy is recommending patient discharge with home health physical therapy.  Patient may also benefit from having a nurse for skilled monitoring.  Patient admitted from [facility].  He is being treated for CML. Has required transfusion of packed cells.   Foley catheter is being discontinued and voiding trials.  Spoke with Lavada Mesi- home owner.  Agreeable to home health.  Patient has no choice.  Lawanda's preference is [facility]. Called heads up referral to [facility] for SN and PT.

## 2017-02-12 NOTE — Progress Notes (Signed)
Foley care given and pnt assisted to use bedpan earlier in shift. Pnt unable to have BM as he thought he had to go. Pnt peri care given  linen changed. Pnt resting no other issues or concerns at this time in the shift. Will continue to monitor and assess.

## 2017-02-12 NOTE — Evaluation (Signed)
Physical Therapy Evaluation Patient Details Name: Lawrence Johnston MRN: [id number] DOB: Mar 08, 1961 Today's Date: 02/12/2017   History of Present Illness  56 y/o male here from group home with weakness.  History includes hypertension, schizophrenia and atrial fibrillation.  Clinical Impression  Pt needed extra cuing and guidance, but once we got up to standing he did well walking ~150 ft including in the hallway ~75 ft w/o AD.  Pt with b/l UE tremors and needed consistent verbal and tactile cues t/o exam/treat. Pt very much struggled with bed mobility and to a lesser degree with getting to standing but with some cuing and training we able to improve ability to do mobility and to maintain sitting at EOB, though still was not very confident with this aspect of session.  Pt should be able to return to his group home if they are able to help with transfers, etc initially.     Follow Up Recommendations Home health PT    Equipment Recommendations       Recommendations for Other Services       Precautions / Restrictions Precautions Precautions: Fall Restrictions Weight Bearing Restrictions: No      Mobility  Bed Mobility Overal bed mobility: Needs Assistance Bed Mobility: Supine to Sit     Supine to sit: Max assist     General bed mobility comments: Pt was able to shift feet to EOB, but was uanble to lift himself up to sitting.  Heavy assist from PT to get to upright and pt still leaning back heavily and was inconsistent with ability to maintain balance, falling backward numerous time if PT was not directly assisting to keep weight forward  Transfers Overall transfer level: Needs assistance Equipment used: Rolling walker (2 wheeled) Transfers: Sit to/from Stand Sit to Stand: Min assist         General transfer comment: Pt needed 3 attempts to get to standing with light assist   Ambulation/Gait Ambulation/Gait assistance: Min assist;Min guard Ambulation Distance (Feet): 150  Feet Assistive device: Rolling walker (2 wheeled);None       General Gait Details: Pt walked ~50 ft with FWW and then was able to walk w/o AD in the hallway w/o needing rails, assist or having significant fatigue.   Stairs            Wheelchair Mobility    Modified Rankin (Stroke Patients Only)       Balance Overall balance assessment: Needs assistance Sitting-balance support: Bilateral upper extremity supported Sitting balance-Leahy Scale: Poor Sitting balance - Comments: Pt falling back and to the side consistently with sitting at EOB (both before and after ambulation)                                     Pertinent Vitals/Pain Pain Assessment: No/denies pain    Home Living Family/patient expects to be discharged to:: Group home                      Prior Function           Comments: Pt reports he is in bed most of the day but walks to meals and is able to be active if he wants     Hand Dominance        Extremity/Trunk Assessment   Upper Extremity Assessment Upper Extremity Assessment: Generalized weakness (significant tremmor R >L, grossly 4-/5)    Lower Extremity Assessment Lower  Extremity Assessment: Overall WFL for tasks assessed       Communication   Communication:  (delayed responses, needed excessive cuing)  Cognition Arousal/Alertness: Awake/alert Behavior During Therapy: Flat affect Overall Cognitive Status: Difficult to assess                                 General Comments: unsure how this session compares to baseline, but given his history today's awareness, etc is likely similar to PLOF      General Comments      Exercises     Assessment/Plan    PT Assessment Patient needs continued PT services  PT Problem List Decreased strength;Decreased range of motion;Decreased activity tolerance;Decreased balance;Decreased mobility;Decreased cognition;Decreased knowledge of use of DME;Decreased  safety awareness       PT Treatment Interventions DME instruction;Gait training;Stair training;Functional mobility training;Therapeutic activities;Therapeutic exercise;Balance training;Neuromuscular re-education;Cognitive remediation;Patient/family education    PT Goals (Current goals can be found in the Care Plan section)  Acute Rehab PT Goals Patient Stated Goal: go back to group home PT Goal Formulation: With patient Time For Goal Achievement: 02/26/17 Potential to Achieve Goals: Fair    Frequency Min 2X/week   Barriers to discharge        Co-evaluation               AM-PAC PT "6 Clicks" Daily Activity  Outcome Measure Difficulty turning over in bed (including adjusting bedclothes, sheets and blankets)?: Unable Difficulty moving from lying on back to sitting on the side of the bed? : Unable Difficulty sitting down on and standing up from a chair with arms (e.g., wheelchair, bedside commode, etc,.)?: Unable Help needed moving to and from a bed to chair (including a wheelchair)?: A Little Help needed walking in hospital room?: A Little Help needed climbing 3-5 steps with a railing? : A Little 6 Click Score: 12    End of Session Equipment Utilized During Treatment: Gait belt Activity Tolerance: Patient tolerated treatment well Patient left: with call bell/phone within reach;with bed alarm set Nurse Communication: Mobility status PT Visit Diagnosis: Muscle weakness (generalized) (M62.81);Difficulty in walking, not elsewhere classified (R26.2)    Time: 1791-5056 PT Time Calculation (min) (ACUTE ONLY): 27 min   Charges:   PT Evaluation $PT Eval Low Complexity: 1 Low PT Treatments $Therapeutic Activity: 8-22 mins   PT G Codes:   PT G-Codes **NOT FOR INPATIENT CLASS** Functional Assessment Tool Used: AM-PAC 6 Clicks Basic Mobility Functional Limitation: Mobility: Walking and moving around Mobility: Walking and Moving Around Current Status ([id number]): At least 60  percent but less than 80 percent impaired, limited or restricted Mobility: Walking and Moving Around Goal Status [phone]): At least 20 percent but less than 40 percent impaired, limited or restricted    Kreg Shropshire, DPT 02/12/2017, 12:46 PM

## 2017-02-12 NOTE — Progress Notes (Signed)
Chaplain responded to a consult for Advanced directives and met pt and family at bedside. [place] educated to pt on how to complete AD. Pt stated he needs time to review material and will contact [place] when ready to complete AD. [place] available to follow up as needed.   02/12/17 1500  Clinical Encounter Type  Visited With Patient and family together  Visit Type Initial;Other (Comment)  Referral From Nurse  Consult/Referral To Chaplain  Spiritual Encounters  Spiritual Needs Literature

## 2017-02-12 NOTE — Progress Notes (Signed)
[facility] [place] NAME: Lawrence Johnston    MR#:  [id number]  DATE OF BIRTH:  05/12/1960  SUBJECTIVE:  patient has diarrhea last night 2 times. Abdominal pain. Patient has continuous tremors of both hands.   CHIEF COMPLAINT:   Chief Complaint  Patient presents with  . Weakness     REVIEW OF SYSTEMS:  Review of Systems  Constitutional: Positive for malaise/fatigue. Negative for chills and fever.  HENT: Negative for congestion, ear discharge, hearing loss and nosebleeds.   Eyes: Negative for blurred vision.  Respiratory: Negative for cough, shortness of breath and wheezing.   Cardiovascular: Positive for leg swelling. Negative for chest pain and palpitations.  Gastrointestinal: Negative for abdominal pain, constipation, diarrhea, nausea and vomiting.  Genitourinary: Negative for dysuria and urgency.  Musculoskeletal: Positive for myalgias.  Neurological: Positive for tremors and weakness. Negative for dizziness, speech change, focal weakness, seizures and headaches.  Psychiatric/Behavioral: Negative for depression.    DRUG ALLERGIES:  No Known Allergies  VITALS:  Blood pressure 140/75, pulse (!) 102, temperature 99.4 F (37.4 C), temperature source Oral, resp. rate 18, height 5\' 9"  (1.753 m), weight 104.3 kg (230 lb), SpO2 94 %.  PHYSICAL EXAMINATION:  Physical Exam  GENERAL:  56 y.o.-year-old patient lying in the bed with no acute distress. Significant dyskinesias noted around the lip, tremors of hands and legs EYES: Pupils equal, round, reactive to light and accommodation. No scleral icterus. Extraocular muscles intact. Pale conjunctiva HEENT: Head atraumatic, normocephalic. Oropharynx and nasopharynx clear.  NECK:  Supple, no jugular venous distention. No thyroid enlargement, no tenderness.  LUNGS: Normal breath sounds bilaterally, no wheezing, rales,rhonchi or crepitation. No use of accessory muscles of respiration. Decreased  bibasilar breath sounds noted CARDIOVASCULAR: S1, S2 normal. No  rubs, or gallops. 2/6 systolic murmur present ABDOMEN: Soft, nontender, nondistended. Bowel sounds present. No organomegaly or mass.  EXTREMITIES: No cyanosis, or clubbing. 2+ bilateral pedal edema NEUROLOGIC: Cranial nerves II through XII are intact. Muscle strength 5/5 in all extremities. Global weakness present. Sensation intact. Gait not checked.  PSYCHIATRIC: The patient is alert and oriented x 3.  SKIN: No obvious rash, lesion, or ulcer.    LABORATORY PANEL:   CBC  Recent Labs Lab 02/10/17 2130  WBC 2.7*  HGB 9.4*  HCT 28.0*  PLT 60*   ------------------------------------------------------------------------------------------------------------------  Chemistries   Recent Labs Lab 02/10/17 1256  02/12/17 0404  NA  --   < > 140  K  --   < > 3.8  CL  --   < > 113*  CO2  --   < > 20*  GLUCOSE  --   < > 124*  BUN  --   < > 19  CREATININE  --   < > 1.60*  CALCIUM  --   < > 7.8*  AST 89*  --   --   ALT 20  --   --   ALKPHOS 50  --   --   BILITOT 1.2  --   --   < > = values in this interval not displayed. ------------------------------------------------------------------------------------------------------------------  Cardiac Enzymes  Recent Labs Lab 02/10/17 0204  TROPONINI <0.03   ------------------------------------------------------------------------------------------------------------------  RADIOLOGY:  No results found.  EKG:   Orders placed or performed during the hospital encounter of 02/10/17  . EKG 12-Lead  . EKG 12-Lead  . EKG 12-Lead  . EKG 12-Lead  . EKG 12-Lead  . EKG 12-Lead  . EKG 12-Lead  .  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 -Hemoglobin noted to be at 6.9. No evidence of infection noted.. Blood cultures are pending -2 units packed RBC transfused and admission -No active bleeding noted, could be secondary to [place] versus worsening CML -Stool for guaiac was positive. on Protonix  #2 acute renal failure-acute on chronic kidney disease, renal function worsened from creatinine of 1.5 -no obstruction noted on CT Patient's creatinine more than 4 on admission, today on 1.6. We will discontinue Foley catheter, and  voiding trials  #3 pancytopenia-secondary to worsening CML versus restarted on Gleevec about 6 weeks ago for symptoms have been persistent for almost 4 weeks now. -Hold Gleevec -Support with transfusion-received 2 units of packed RBC 4, hemoglobin improved from 6.9-9.4. -Currently no indication for platelet transfusion at this time unless there is active bleeding or platelets less than 20 K -Oncology consulted  #4 schizophrenia, schizoaffective disorder: Seen by Dr. Weber Cooks from Psych; restarted the Depakote, increase the Zyprexa, continue Seroquel, change and Ativan to Klonopin., Watch closely today. #5 diabetes mellitus type 2:, Controlled, continue sliding scale with coverage.   #6 DVT prophylaxis-Ted's and SCDs at this time  #7. deconditioning: Physical therapy consult today  All the records are reviewed and case discussed with Care Management/Social Workerr. Management plans discussed with the patient, family and they are in agreement.  CODE STATUS: Full Code  TOTAL TIME TAKING CARE OF THIS PATIENT: 37 minutes.   POSSIBLE D/C IN 2-3 DAYS, DEPENDING ON CLINICAL CONDITION.   Epifanio Lesches M.D on 02/12/2017 at 8:22 AM  Between 7am to 6pm - Pager - [phone]  After 6pm go to [email or web address] - password [email or web address]  Office  [phone]  CC: Primary care physician; Idelle Crouch, MD

## 2017-02-13 ENCOUNTER — Inpatient Hospital Stay: Payer: Medicare Other

## 2017-02-13 DIAGNOSIS — I10 Essential (primary) hypertension: Secondary | ICD-10-CM

## 2017-02-13 DIAGNOSIS — D61818 Other pancytopenia: Secondary | ICD-10-CM

## 2017-02-13 DIAGNOSIS — F209 Schizophrenia, unspecified: Secondary | ICD-10-CM

## 2017-02-13 DIAGNOSIS — Z79899 Other long term (current) drug therapy: Secondary | ICD-10-CM

## 2017-02-13 DIAGNOSIS — Z87891 Personal history of nicotine dependence: Secondary | ICD-10-CM

## 2017-02-13 DIAGNOSIS — F329 Major depressive disorder, single episode, unspecified: Secondary | ICD-10-CM

## 2017-02-13 DIAGNOSIS — E119 Type 2 diabetes mellitus without complications: Secondary | ICD-10-CM

## 2017-02-13 DIAGNOSIS — D61810 Antineoplastic chemotherapy induced pancytopenia: Secondary | ICD-10-CM

## 2017-02-13 DIAGNOSIS — T451X5A Adverse effect of antineoplastic and immunosuppressive drugs, initial encounter: Secondary | ICD-10-CM

## 2017-02-13 DIAGNOSIS — I4891 Unspecified atrial fibrillation: Secondary | ICD-10-CM

## 2017-02-13 DIAGNOSIS — E7800 Pure hypercholesterolemia, unspecified: Secondary | ICD-10-CM

## 2017-02-13 DIAGNOSIS — R140 Abdominal distension (gaseous): Secondary | ICD-10-CM

## 2017-02-13 DIAGNOSIS — K560 Paralytic ileus: Secondary | ICD-10-CM

## 2017-02-13 DIAGNOSIS — K219 Gastro-esophageal reflux disease without esophagitis: Secondary | ICD-10-CM

## 2017-02-13 DIAGNOSIS — D469 Myelodysplastic syndrome, unspecified: Secondary | ICD-10-CM

## 2017-02-13 DIAGNOSIS — C9210 Chronic myeloid leukemia, BCR/ABL-positive, not having achieved remission: Secondary | ICD-10-CM

## 2017-02-13 DIAGNOSIS — G4730 Sleep apnea, unspecified: Secondary | ICD-10-CM

## 2017-02-13 LAB — GLUCOSE, CAPILLARY
Glucose-Capillary: 116 mg/dL — ABNORMAL HIGH (ref 65–99)
Glucose-Capillary: 150 mg/dL — ABNORMAL HIGH (ref 65–99)
Glucose-Capillary: 110 mg/dL — ABNORMAL HIGH (ref 65–99)
Glucose-Capillary: 91 mg/dL (ref 65–99)

## 2017-02-13 MED ORDER — PIPERACILLIN-TAZOBACTAM 3.375 G IVPB
3.3750 g | Freq: Three times a day (TID) | INTRAVENOUS | Status: DC
  Administered 2017-02-13 – 2017-02-14 (×3): 3.375 g via INTRAVENOUS
  Filled 2017-02-13 (×3): qty 50

## 2017-02-13 MED ORDER — BISACODYL 10 MG RE SUPP
10.0000 mg | Freq: Once | RECTAL | Status: AC
  Administered 2017-02-13: 10 mg via RECTAL
  Filled 2017-02-13: qty 1

## 2017-02-13 MED ORDER — SODIUM CHLORIDE 0.9 % IV BOLUS (SEPSIS)
1000.0000 mL | Freq: Once | INTRAVENOUS | Status: AC
  Administered 2017-02-13: 1000 mL via INTRAVENOUS

## 2017-02-13 MED ORDER — DILTIAZEM HCL 30 MG PO TABS
30.0000 mg | ORAL_TABLET | Freq: Four times a day (QID) | ORAL | Status: DC
  Administered 2017-02-14: 30 mg via ORAL
  Filled 2017-02-13 (×2): qty 1

## 2017-02-13 MED ORDER — LACTULOSE 10 GM/15ML PO SOLN
30.0000 g | Freq: Two times a day (BID) | ORAL | Status: DC
  Administered 2017-02-14 – 2017-02-18 (×10): 30 g via ORAL
  Filled 2017-02-13 (×10): qty 60

## 2017-02-13 MED ORDER — METOPROLOL TARTRATE 5 MG/5ML IV SOLN
5.0000 mg | INTRAVENOUS | Status: DC | PRN
  Filled 2017-02-13: qty 5

## 2017-02-13 NOTE — Progress Notes (Signed)
Hematology/Oncology Progress Note [facility] [phone] [phone]  Patient Care Team: Idelle Crouch, MD as PCP - General (Internal Medicine)   Name of the patient: Lawrence Johnston  [id number]  1961-04-15  Date of visit: 02/13/17   INTERVAL HISTORY-  Patient was seen at bedside. RN at bedside attempting NG tube as he has distended abdomen and Xray demonstrates diffuse colonic gaseous distention.   Review of systems- ROS Deferred as patient is comfortable and RN is attempting NG tube.  No Known Allergies  Patient Active Problem List   Diagnosis Date Noted  . Symptomatic anemia 02/10/2017  . 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 MDS (myelodysplastic syndrome) ([place]) 03/11/2016  . 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 Schizophrenia ([place]) 06/19/2013     Past Medical History:  Diagnosis Date  . Arrhythmia    Atrial Fibrillation; Atrial Flutter w/RVR; SVT  . Depression   . Diabetes ([place])   . Dysrhythmia   . GERD (gastroesophageal reflux disease)   . Hypercholesteremia   . Hypertension   . Schizophrenia ([place])   . Sleep apnea   . Tremor      Past Surgical History:  Procedure Laterality Date  . CARDIOVERSION     x2, a-fib  . COLONOSCOPY WITH PROPOFOL N/A 02/25/2015   Procedure: COLONOSCOPY WITH PROPOFOL;  Surgeon: Josefine Class, MD;  Location: [facility] ENDOSCOPY;  Service: Endoscopy;  Laterality: N/A;  . COLONOSCOPY WITH PROPOFOL N/A 04/12/2015   Procedure: COLONOSCOPY WITH PROPOFOL;  Surgeon: Josefine Class, MD;  Location: [facility] ENDOSCOPY;  Service: Endoscopy;  Laterality: N/A;    Social History   Social History  . Marital status: Single    Spouse name: N/A  . Number of children: N/A  . Years of education: N/A   Occupational History  . Not on file.   Social History Main Topics  . Smoking status: Never  Smoker  . Smokeless tobacco: Never Used  . Alcohol use No  . Drug use: No  . Sexual activity: Not on file   Other Topics Concern  . Not on file   Social History Narrative  . No narrative on file     No family history on file.   Current Facility-Administered Medications:  .  0.9 %  sodium chloride infusion, , Intravenous, Continuous, Epifanio Lesches, MD, Last Rate: 75 mL/hr at 02/13/17 1319 .  acetaminophen (TYLENOL) tablet 650 mg, 650 mg, Oral, Q6H PRN **OR** acetaminophen (TYLENOL) suppository 650 mg, 650 mg, Rectal, Q6H PRN, Harrie Foreman, MD .  benztropine (COGENTIN) tablet 0.5 mg, 0.5 mg, Oral, BID, Gladstone Lighter, MD, 0.5 mg at 02/12/17 2339 .  clonazePAM (KLONOPIN) tablet 0.5 mg, 0.5 mg, Oral, BID, Clapacs, John T, MD, 0.5 mg at 02/12/17 2339 .  diltiazem (CARDIZEM) tablet 30 mg, 30 mg, Oral, Q6H, Epifanio Lesches, MD, Stopped at 02/13/17 1230 .  divalproex (DEPAKOTE ER) 24 hr tablet 2,000 mg, 2,000 mg, Oral, QHS, Clapacs, John T, MD, 2,000 mg at 02/12/17 2339 .  docusate sodium (COLACE) capsule 100 mg, 100 mg, Oral, BID, Harrie Foreman, MD, 100 mg at 02/12/17 2338 .  FLUoxetine (PROZAC) capsule 20 mg, 20 mg, Oral, Daily, Harrie Foreman, MD, 20 mg at 02/12/17 0911 .  gabapentin (NEURONTIN) capsule 300 mg, 300 mg, Oral, QHS, Harrie Foreman, MD, 300 mg at 02/12/17 2338 .  insulin aspart (novoLOG) injection 0-5 Units, 0-5 Units, Subcutaneous, QHS,  Harrie Foreman, MD .  insulin aspart (novoLOG) injection 0-9 Units, 0-9 Units, Subcutaneous, TID WC, Harrie Foreman, MD, 1 Units at 02/12/17 1240 .  lactulose (CHRONULAC) 10 GM/15ML solution 30 g, 30 g, Oral, BID, Epifanio Lesches, MD .  metoprolol succinate (TOPROL-XL) 24 hr tablet 25 mg, 25 mg, Oral, BID, Epifanio Lesches, MD, Stopped at 02/13/17 1000 .  multivitamins with iron tablet 1 tablet, 1 tablet, Oral, Daily, Harrie Foreman, MD, 1 tablet at 02/12/17 0911 .  OLANZapine (ZYPREXA)  tablet 10 mg, 10 mg, Oral, QHS, Clapacs, John T, MD, 10 mg at 02/12/17 2338 .  ondansetron (ZOFRAN) tablet 4 mg, 4 mg, Oral, Q6H PRN **OR** ondansetron (ZOFRAN) injection 4 mg, 4 mg, Intravenous, Q6H PRN, Harrie Foreman, MD .  pantoprazole (PROTONIX) EC tablet 40 mg, 40 mg, Oral, Daily, Harrie Foreman, MD, 40 mg at 02/12/17 0911 .  piperacillin-tazobactam (ZOSYN) IVPB 3.375 g, 3.375 g, Intravenous, Q8H, Epifanio Lesches, MD, Last Rate: 12.5 mL/hr at 02/13/17 1718, 3.375 g at 02/13/17 1718 .  polyethylene glycol (MIRALAX / GLYCOLAX) packet 17 g, 17 g, Oral, Daily, Harrie Foreman, MD, 17 g at 02/11/17 0900 .  QUEtiapine (SEROQUEL) tablet 100 mg, 100 mg, Oral, QHS, Epifanio Lesches, MD, 100 mg at 02/13/17 0000 .  rOPINIRole (REQUIP) tablet 2 mg, 2 mg, Oral, QHS, Harrie Foreman, MD, 2 mg at 02/12/17 2338 .  rosuvastatin (CRESTOR) tablet 20 mg, 20 mg, Oral, QHS, Harrie Foreman, MD, 20 mg at 02/12/17 2338   Physical exam:  Vitals:   02/13/17 0903 02/13/17 1230 02/13/17 1607 02/13/17 2027  BP: 98/69 112/64 (!) 126/94 (!) 170/136  Pulse: (!) 140  88 (!) 116  Resp: 18  18   Temp: 98.5 F (36.9 C)  98 F (36.7 C)   TempSrc: Oral  Oral   SpO2: 100%  97% 95%  Weight:      Height:       GENERAL:Alert, in acute distress.  EYES: no pallor or icterus OROPHARYNX: no thrush or ulceration;  NECK: supple, no masses felt LYMPH:  no palpable lymphadenopathy in the cervical, axillary or inguinal regions LUNGS: clear to auscultation and  No wheeze or crackles HEART/CVS: regular rate & rhythm and no murmurs; No lower extremity edema ABDOMEN: Diffusely distended and tympanitic with no audible bowel sounds. Musculoskeletal:no cyanosis of digits and no clubbing  PSYCH: alert  NEURO: no focal motor/sensory deficits SKIN:  no rashes or significant lesions     CMP Latest Ref Rng & Units 02/12/2017  Glucose 65 - 99 mg/dL 124(H)  BUN 6 - 20 mg/dL 19  Creatinine 0.61 - 1.24 mg/dL  1.60(H)  Sodium 135 - 145 mmol/L 140  Potassium 3.5 - 5.1 mmol/L 3.8  Chloride 101 - 111 mmol/L 113(H)  CO2 22 - 32 mmol/L 20(L)  Calcium 8.9 - 10.3 mg/dL 7.8(L)  Total Protein 6.5 - 8.1 g/dL -  Total Bilirubin 0.3 - 1.2 mg/dL -  Alkaline Phos 38 - 126 U/L -  AST 15 - 41 U/L -  ALT 17 - 63 U/L -   CBC Latest Ref Rng & Units 02/12/2017  WBC 3.8 - 10.6 K/uL 2.5(L)  Hemoglobin 13.0 - 18.0 g/dL 9.0(L)  Hematocrit 40.0 - 52.0 % 27.6(L)  Platelets 150 - 440 K/uL 42(L)    @IMAGES @  Ct Abdomen Pelvis Wo Contrast  Result Date: 02/10/2017 CLINICAL DATA:  Acute onset of dizziness. Status post fall in shower, with generalized weakness. Hypotension. Initial encounter.  EXAM: CT ABDOMEN AND PELVIS WITHOUT CONTRAST TECHNIQUE: Multidetector CT imaging of the abdomen and pelvis was performed following the standard protocol without IV contrast. COMPARISON:  None. FINDINGS: Lower chest: Mild bibasilar atelectasis is noted. The visualized portions of the mediastinum are unremarkable. Hepatobiliary: The liver is grossly unremarkable in appearance. The gallbladder is grossly unremarkable. The common bile duct remains normal in caliber. Pancreas: The pancreas is within normal limits. Spleen: The spleen is enlarged, measuring 17.7 cm in length. Adrenals/Urinary Tract: The adrenal glands are grossly unremarkable appearance. The kidneys are grossly unremarkable in appearance. There is no evidence of hydronephrosis. No renal or ureteral stones are identified. Nonspecific perinephric stranding is noted bilaterally. Stomach/Bowel: The stomach is unremarkable in appearance. The small bowel is within normal limits. The appendix is not visualized; there is no evidence for appendicitis. The colon is unremarkable in appearance. Vascular/Lymphatic: The abdominal aorta is unremarkable in appearance. The inferior vena cava is grossly unremarkable. Visualized retroperitoneal nodes remain normal in size. No pelvic sidewall  lymphadenopathy is identified. Reproductive: The bladder is mildly distended and grossly unremarkable. The prostate remains normal in size, with scattered calcifications. Other: Small to moderate volume ascites is noted within the abdomen and pelvis. Soft tissue edema along the abdominal wall raises concern for mild anasarca. Musculoskeletal: No acute osseous abnormalities are identified. Vacuum phenomenon is noted at L1-L2. Chronic bilateral pars defects are seen at L5, without evidence of anterolisthesis. The visualized musculature is unremarkable in appearance. IMPRESSION: 1. No acute abnormality seen to explain the patient's symptoms. 2. Small to moderate volume ascites noted within the abdomen and pelvis. 3. Splenomegaly. 4. Soft tissue edema along the abdominal wall raises concern for mild anasarca. 5. Mild bibasilar atelectasis. 6. Chronic bilateral pars defects at L5, without evidence of anterolisthesis. Electronically Signed   By: Garald Balding M.D.   On: 02/10/2017 05:01   Ct Head Wo Contrast  Result Date: 02/10/2017 CLINICAL DATA:  Acute onset of dizziness after fall. Generalized weakness. Initial encounter. EXAM: CT HEAD WITHOUT CONTRAST TECHNIQUE: Contiguous axial images were obtained from the base of the skull through the vertex without intravenous contrast. COMPARISON:  CT of the head performed 08/19/2015 FINDINGS: Brain: No evidence of acute infarction, hemorrhage, hydrocephalus, extra-axial collection or mass lesion/mass effect. Prominence of the ventricles and sulci reflects mild cortical volume loss. Mild cerebellar atrophy is noted. The brainstem and fourth ventricle are within normal limits. The basal ganglia are unremarkable in appearance. The cerebral hemispheres demonstrate grossly normal gray-white differentiation. No mass effect or midline shift is seen. Vascular: No hyperdense vessel or unexpected calcification. Skull: There is no evidence of fracture; visualized osseous structures  are unremarkable in appearance. Sinuses/Orbits: The visualized portions of the orbits are within normal limits. The paranasal sinuses and mastoid air cells are well-aerated. Other: No significant soft tissue abnormalities are seen. IMPRESSION: 1. No evidence of traumatic intracranial injury or fracture. 2. Mild cortical volume loss noted. Electronically Signed   By: Garald Balding M.D.   On: 02/10/2017 03:15   Dg Chest Port 1 View  Result Date: 02/13/2017 CLINICAL DATA:  NG tube placement. EXAM: PORTABLE CHEST 1 VIEW COMPARISON:  02/13/2017 FINDINGS: Catheter projects over the mid thorax, tip is not visualized. Cardiomediastinal silhouette is normal for portable technique. Mediastinal contours appear intact. There is no evidence of focal airspace consolidation, pleural effusion or pneumothorax. Low lung volumes Osseous structures are without acute abnormality. Soft tissues are grossly normal. IMPRESSION: The tip of the enteric catheter is not visualized. Low  lung volumes. Electronically Signed   By: Fidela Salisbury M.D.   On: 02/13/2017 15:15   Dg Chest Port 1 View  Result Date: 02/13/2017 CLINICAL DATA:  Dizziness, GI bleeding and hypotension. EXAM: PORTABLE CHEST 1 VIEW COMPARISON:  10/27/2011 FINDINGS: The heart size and mediastinal contours are within normal limits. Lung volumes are low bilaterally. There is elevation of the right hemidiaphragm. Bibasilar scarring and atelectasis present. There is no evidence of pulmonary edema, consolidation, pneumothorax, nodule or pleural fluid. The visualized skeletal structures are unremarkable. IMPRESSION: Low lung volumes with bibasilar scarring/ atelectasis. Electronically Signed   By: Aletta Edouard M.D.   On: 02/13/2017 13:57   Dg Abd Portable 1v  Result Date: 02/13/2017 CLINICAL DATA:  Abdominal distention.  GI bleed and hypotension. EXAM: PORTABLE ABDOMEN - 1 VIEW COMPARISON:  CT of the abdomen and pelvis on 02/10/2017 FINDINGS: Increased gaseous  distention of bowel since the recent CT study, especially of the colon. Some gas is also present in small bowel. Findings are consistent with ileus. No gross free air or pneumatosis identified. IMPRESSION: Increase gaseous distention of primarily the colon is consistent with ileus. Electronically Signed   By: Aletta Edouard M.D.   On: 02/13/2017 13:55    Assessment and plan- Patient is a 56 y.o. male with CML on Gleevec, pancytopenia, acute renal failure, hypotension. 1. CML: Continue to hold Gleevec at this time. 2. Pancytopenia: secondary to [place], but progression of disease is a possibility. Hold Gleevec as above. Continue to transfuse as indicated. Counts are stable today, no need for transfusion. 3. Ileus: s/p surgery evaluation. Defer surgery management.  4. Schizophrenia: Continue current medications as prescribed.  Will follow along patient's inpatient course.   Dr. Earlie Server, MD, PhD [facility] Pager- [phone] 02/13/2017

## 2017-02-13 NOTE — Progress Notes (Signed)
Pt noted with no output from NG tube. Placement checked, per xray, unable to visualize tip. Advanced cath 5 cm with no result. MD paged. No call back. [facility] paged. New orders per Dr. Doy Hutching for surgical consult. NG tube was removed and OG tube placed with minimal output. Awaiting consult from Dr. Tamala Julian.

## 2017-02-13 NOTE — Progress Notes (Signed)
NG tube placed per MD order. Pt is to remain NPO. See abd xray results.

## 2017-02-13 NOTE — Progress Notes (Signed)
Pharmacy Antibiotic Note  Lawrence Johnston is a 56 y.o. male admitted on 02/10/2017 with pneumonia.  Pharmacy has been consulted for zosyn dosing.  Plan: Zosyn 3.375g IV q8h (4 hour infusion).  Height: 5\' 9"  (175.3 cm) Weight: 230 lb (104.3 kg) IBW/kg (Calculated) : 70.7  Temp (24hrs), Avg:99.3 F (37.4 C), Min:98.5 F (36.9 C), Max:100 F (37.8 C)   Recent Labs Lab 02/10/17 0204 02/10/17 0530 02/10/17 2130 02/11/17 1155 02/12/17 0404 02/12/17 1436  WBC 2.3*  --  2.7*  --   --  2.5*  CREATININE 4.04*  --   --  1.81* 1.60*  --   LATICACIDVEN 5.0* 1.8  --   --   --   --     Estimated Creatinine Clearance: 61.3 mL/min (A) (by C-G formula based on SCr of 1.6 mg/dL (H)).    No Known Allergies  Antimicrobials this admission: Anti-infectives    Start     Dose/Rate Route Frequency Ordered Stop   02/13/17 1400  piperacillin-tazobactam (ZOSYN) IVPB 3.375 g     3.375 g 12.5 mL/hr over 240 Minutes Intravenous Every 8 hours 02/13/17 1329        Microbiology results: Recent Results (from the past 240 hour(s))  MRSA PCR Screening     Status: None   Collection Time: 02/10/17  4:54 AM  Result Value Ref Range Status   MRSA by PCR NEGATIVE NEGATIVE Final    Comment:        The GeneXpert MRSA Assay (FDA approved for NASAL specimens only), is one component of a comprehensive MRSA colonization surveillance program. It is not intended to diagnose MRSA infection nor to guide or monitor treatment for MRSA infections.      Thank you for allowing pharmacy to be a part of this patient's care.  [name] 02/13/2017 1:39 PM

## 2017-02-13 NOTE — H&P (Signed)
Lawrence Johnston is an 56 y.o. male.   Chief Complaint: Discomfort in the abdomen HPI: He was admitted on 02/10/2017 after a fall in which he was in the shower and fell sustaining a bruise of the right side of his rib cage. He had been having some generalized weakness and unable to walk at his nursing home. He also reported some associated dizziness and had of measured blood pressure of 90/40. He has now been in the hospital for several days. At the time of admission there was a report of a black bowel movement. The patient was unaware of any rectal bleeding. He has had 6 bowel movements since admission which were described as brown some mushy and some loose but none watery. He is not aware of passing any gas in the last day or 2. Today it was realized he had some abdominal distention and had a x-ray which demonstrated diffuse colonic gaseous distention. There was no significant amount of gas in small bowel or stomach. An attempt was made to pass a nasogastric tube however there was no drainage obtained. The tube was removed and was put in as an oral gastric tube and did have about 20 cc of a clear fluid coming out. After I was called our requested the nurse removed the orogastric tube. On further questioning he reports no nausea or vomiting. He did have some applesauce for breakfast and some mashed potatoes for lunch. He reports minimal abdominal discomfort.  PAST MEDICAL HISTORY: He reports recent findings of chronic myelocytic leukemia. He has had oncology evaluation and started on oral chemotherapy. According to records he is taking Gleevec.  He is also been anemic and was given blood transfusion since admission.   He reports a history of heart problems in which his heart was "going in circles". This was treated with catheterization and resolved.Marland Kitchen He reports no history of myocardial infarction. He reports no history of pulmonary disease. No history of hepatitis.  He does report a past history of  constipation and chronically takes MiraLAX  Other past medical history was reviewed as noted below. Past Medical History:  Diagnosis Date  . Arrhythmia    Atrial Fibrillation; Atrial Flutter w/RVR; SVT  . Depression   . Diabetes ([place])   . Dysrhythmia   . GERD (gastroesophageal reflux disease)   . Hypercholesteremia   . Hypertension   . Schizophrenia ([place])   . Sleep apnea   . Tremor     Past Surgical History:  Procedure Laterality Date  . CARDIOVERSION     x2, a-fib  . COLONOSCOPY WITH PROPOFOL N/A 02/25/2015   Procedure: COLONOSCOPY WITH PROPOFOL;  Surgeon: Josefine Class, MD;  Location: [facility] ENDOSCOPY;  Service: Endoscopy;  Laterality: N/A;  . COLONOSCOPY WITH PROPOFOL N/A 04/12/2015   Procedure: COLONOSCOPY WITH PROPOFOL;  Surgeon: Josefine Class, MD;  Location: [facility] ENDOSCOPY;  Service: Endoscopy;  Laterality: N/A;  He reports no history of abdominal surgery.  No family history on file. He reports family history of diabetes   Social History:  reports that he has never smoked. He has never used smokeless tobacco. He reports that he does not drink alcohol or use drugs. He reports he was married until about 20 years ago. 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  Allergies: No Known Allergies  Medications Prior to Admission  Medication Sig Dispense Refill  . benztropine (COGENTIN) 0.5 MG tablet Take 0.5 mg by mouth 2 (two) times daily.    Marland Kitchen  divalproex (DEPAKOTE ER) 500 MG 24 hr tablet Take 2,000 mg by mouth at bedtime.    Marland Kitchen FLUoxetine (PROZAC) 20 MG capsule Take 20 mg by mouth daily.    . fluPHENAZine (PROLIXIN) 5 MG tablet Take 5 mg by mouth 2 (two) times daily.    . furosemide (LASIX) 20 MG tablet Take 20 mg by mouth daily.    Marland Kitchen gabapentin (NEURONTIN) 300 MG capsule Take 300 mg by mouth at bedtime.     Marland Kitchen imatinib (GLEEVEC) 400 MG tablet Take 1 tablet (400 mg total) by mouth daily. Take with meals and large glass of  water.Caution:Chemotherapy. 30 tablet 5  . INGREZZA 80 MG CAPS Take 80 mg by mouth daily.    Marland Kitchen LORazepam (ATIVAN) 0.5 MG tablet Take 0.5 mg by mouth 2 (two) times daily.    . metFORMIN (GLUCOPHAGE) 500 MG tablet Take 500 mg by mouth 2 (two) times daily with a meal.    . metoprolol succinate (TOPROL-XL) 25 MG 24 hr tablet Take 25 mg by mouth 2 (two) times daily.     . Multiple Vitamins-Iron (MULTIVITAMINS WITH IRON) TABS tablet Take 1 tablet by mouth daily.    . pantoprazole (PROTONIX) 40 MG tablet Take 40 mg by mouth daily.    . polyethylene glycol (MIRALAX / GLYCOLAX) packet Take 17 g by mouth daily.    . QUEtiapine (SEROQUEL) 300 MG tablet Take 300 mg by mouth at bedtime.    Marland Kitchen rOPINIRole (REQUIP) 2 MG tablet Take 2 mg by mouth at bedtime.    . rosuvastatin (CRESTOR) 20 MG tablet Take 20 mg by mouth at bedtime.     . senna-docusate (SENOKOT-S) 8.6-50 MG tablet Take 1 tablet by mouth 2 (two) times daily.     . sitaGLIPtin (JANUVIA) 100 MG tablet Take 100 mg by mouth daily.     Marland Kitchen allopurinol (ZYLOPRIM) 300 MG tablet Take 1 tablet (300 mg total) by mouth daily. Start treatment 1 week prior to initiating Tasigna. (Patient not taking: Reported on 02/10/2017) 30 tablet 2   REVIEW of SYSTEMS: He reports that in recent weeks he has been weak and has not done any substantial walking. Does get from his bed at the nursing home up to a bedside chair and sits in a chair in his shower. He reports no other recent acute illness such as cough cold or sore throat. He reports no recent visual changes. He reports no difficulty swallowing, no swollen glands in his neck, no heartburn. He reports no nausea or vomiting. He has had limited oral intake since being in the hospital. He reports no dyspnea and no chest pains. He did has noted have a fall prior to admission and some dizziness. He did have some symptomatic urinary retention today and did have a catheter inserted draining 650 cc of urine. He reports he is not aware  of ankle edema. He reports no recent source or boils.    Results for orders placed or performed during the hospital encounter of 02/10/17 (from the past 48 hour(s))  Glucose, capillary     Status: Abnormal   Collection Time: 02/11/17 10:37 PM  Result Value Ref Range   Glucose-Capillary 122 (H) 65 - 99 mg/dL   Comment 1 Notify RN   Basic metabolic panel     Status: Abnormal   Collection Time: 02/12/17  4:04 AM  Result Value Ref Range   Sodium 140 135 - 145 mmol/L   Potassium 3.8 3.5 - 5.1 mmol/L   Chloride  113 (H) 101 - 111 mmol/L   CO2 20 (L) 22 - 32 mmol/L   Glucose, Bld 124 (H) 65 - 99 mg/dL   BUN 19 6 - 20 mg/dL   Creatinine, Ser 1.60 (H) 0.61 - 1.24 mg/dL   Calcium 7.8 (L) 8.9 - 10.3 mg/dL   GFR calc non Af Amer 47 (L) >60 mL/min   GFR calc Af Amer 54 (L) >60 mL/min    Comment: (NOTE) The eGFR has been calculated using the CKD EPI equation. This calculation has not been validated in all clinical situations. eGFR's persistently <60 mL/min signify possible Chronic Kidney Disease.    Anion gap 7 5 - 15  Glucose, capillary     Status: Abnormal   Collection Time: 02/12/17  7:54 AM  Result Value Ref Range   Glucose-Capillary 113 (H) 65 - 99 mg/dL  Glucose, capillary     Status: Abnormal   Collection Time: 02/12/17 12:06 PM  Result Value Ref Range   Glucose-Capillary 123 (H) 65 - 99 mg/dL  CBC with Differential     Status: Abnormal   Collection Time: 02/12/17  2:36 PM  Result Value Ref Range   WBC 2.5 (L) 3.8 - 10.6 K/uL   RBC 2.87 (L) 4.40 - 5.90 MIL/uL   Hemoglobin 9.0 (L) 13.0 - 18.0 g/dL   HCT 27.6 (L) 40.0 - 52.0 %   MCV 96.0 80.0 - 100.0 fL   MCH 31.4 26.0 - 34.0 pg   MCHC 32.7 32.0 - 36.0 g/dL   RDW 19.5 (H) 11.5 - 14.5 %   Platelets 42 (L) 150 - 440 K/uL   Neutrophils Relative % 67 %   Neutro Abs 1.7 1.4 - 6.5 K/uL   Lymphocytes Relative 14 %   Lymphs Abs 0.4 (L) 1.0 - 3.6 K/uL   Monocytes Relative 17 %   Monocytes Absolute 0.4 0.2 - 1.0 K/uL   Eosinophils  Relative 1 %   Eosinophils Absolute 0.0 0 - 0.7 K/uL   Basophils Relative 1 %   Basophils Absolute 0.0 0 - 0.1 K/uL  Glucose, capillary     Status: Abnormal   Collection Time: 02/12/17  5:04 PM  Result Value Ref Range   Glucose-Capillary 107 (H) 65 - 99 mg/dL  Glucose, capillary     Status: Abnormal   Collection Time: 02/12/17  9:10 PM  Result Value Ref Range   Glucose-Capillary 128 (H) 65 - 99 mg/dL  Glucose, capillary     Status: Abnormal   Collection Time: 02/13/17  9:59 AM  Result Value Ref Range   Glucose-Capillary 116 (H) 65 - 99 mg/dL  Glucose, capillary     Status: Abnormal   Collection Time: 02/13/17 12:33 PM  Result Value Ref Range   Glucose-Capillary 150 (H) 65 - 99 mg/dL  Glucose, capillary     Status: Abnormal   Collection Time: 02/13/17  4:32 PM  Result Value Ref Range   Glucose-Capillary 110 (H) 65 - 99 mg/dL   Dg Chest Port 1 View  Result Date: 02/13/2017 CLINICAL DATA:  NG tube placement. EXAM: PORTABLE CHEST 1 VIEW COMPARISON:  02/13/2017 FINDINGS: Catheter projects over the mid thorax, tip is not visualized. Cardiomediastinal silhouette is normal for portable technique. Mediastinal contours appear intact. There is no evidence of focal airspace consolidation, pleural effusion or pneumothorax. Low lung volumes Osseous structures are without acute abnormality. Soft tissues are grossly normal. IMPRESSION: The tip of the enteric catheter is not visualized. Low lung volumes. Electronically Signed  By: Fidela Salisbury M.D.   On: 02/13/2017 15:15   Dg Chest Port 1 View  Result Date: 02/13/2017 CLINICAL DATA:  Dizziness, GI bleeding and hypotension. EXAM: PORTABLE CHEST 1 VIEW COMPARISON:  10/27/2011 FINDINGS: The heart size and mediastinal contours are within normal limits. Lung volumes are low bilaterally. There is elevation of the right hemidiaphragm. Bibasilar scarring and atelectasis present. There is no evidence of pulmonary edema, consolidation, pneumothorax,  nodule or pleural fluid. The visualized skeletal structures are unremarkable. IMPRESSION: Low lung volumes with bibasilar scarring/ atelectasis. Electronically Signed   By: Aletta Edouard M.D.   On: 02/13/2017 13:57   Dg Abd Portable 1v  Result Date: 02/13/2017 CLINICAL DATA:  Abdominal distention.  GI bleed and hypotension. EXAM: PORTABLE ABDOMEN - 1 VIEW COMPARISON:  CT of the abdomen and pelvis on 02/10/2017 FINDINGS: Increased gaseous distention of bowel since the recent CT study, especially of the colon. Some gas is also present in small bowel. Findings are consistent with ileus. No gross free air or pneumatosis identified. IMPRESSION: Increase gaseous distention of primarily the colon is consistent with ileus. Electronically Signed   By: Aletta Edouard M.D.   On: 02/13/2017 13:55    X-ray REVIEW: I have reviewed his CT images which do demonstrate ascites and also fairly prominent amount of stool within the colon but there was no gaseous distention of the colon at that time. Abdominal film done today demonstrates marked gaseous distention of the colon but otherwise no evidence of bowel obstruction. Chest x-ray demonstrates low lung volumes and on one x-ray the tip of the NG tube was seen in the mid thoracic area.  Blood pressure (!) 126/94, pulse 88, temperature 98 F (36.7 C), temperature source Oral, resp. rate 18, height 5' 9"  (1.753 m), weight 230 lb (104.3 kg), SpO2 97 %.  Physical Exam: He is awake alert and oriented.  SKIN: Warm and dry without rash. There is a large area of ecchymosis of the right chest wall.    HEENT:  Head is normocephalic.  Pupils are equal reactive to light.  Extraocular movements are intact. Sclera is clear.  Pharynx is clear. He has no teeth.  NECK:  Supple with no palpable mass and no adenopathy.  HEART:  Regular rhythm S1-S2, rapid rate grade 2 systolic murmur which does not radiate into the neck.  LUNGS:  Clear without rales rhonchi or  wheezes.  ABDOMEN: Diffusely distended and tympanitic with no audible bowel sounds. There is minimal degree of tenderness on deep palpation. No palpable mass. No guarding. There is a trace of abdominal wall edema  RECTUM: External anal appearance without dermatitis. Digital exam demonstrates slightly hypertonic sphincter tone and no palpable rectal mass. There was no significant stool in the rectum. There is moderate diffuse enlargement of the prostate.  NEUROLOGIC:  Awake alert and moving all extremities.  He does have a violent intermittent rest tremor involving both hands.  EXTREMITIES:  Well-developed well-nourished with trace dependent edema.   Assessment/Plan Adynamic ileus currently of undetermined etiology Ascites Hypoalbuminemia Normocytic anemia Leukopenia Thrombocytopenia Chronic myelogenous leukemia Generalized weakness Reported history of a melanotic stool  I do not think he necessarily needs a nasogastric tube at present. I discussed with the nurse giving him a Dulcolax suppository to see if that helps and passed gas. If that fails he may benefit from small-volume soapsuds enema. We'll keep him nothing by mouth except for medicines with simple water until ileus is improved. There does not appear to be any  role for abdominal surgery at present.  Rochel Brome, MD 02/13/2017, 6:51 PM

## 2017-02-13 NOTE — Progress Notes (Signed)
Pt with noted tachycardia, unable to obtain accurate BP due to tremors. BP per dinamap reads 209/137, manual BP is only audible between 84 and 78. MD notified, Dr. Vianne Bulls at bedside. Initially ordered IV metoprolol, but verbal order to hold after BP read at 94/65. Pt states he feels fine, no issues.  1 liter bolus 0.9 NS ordered and infusing. Cardiac monitoring placed, per tele tech pt is reading A-fib, but difficult to discern due to elevated HR. Will continue to monitor.

## 2017-02-13 NOTE — Progress Notes (Signed)
[facility] [place] NAME: Lawrence Johnston    MR#:  [id number]  DATE OF BIRTH:  30-Oct-1960  SUBJECTIVE:  Hypotensive and tachycardic today with heart rate up to 1 40 bpm, blood pressure 98/78. Patient denies any complaints of shortness of breath or chest pain.  CHIEF COMPLAINT:   Chief Complaint  Patient presents with  . Weakness     REVIEW OF SYSTEMS:  Review of Systems  Constitutional: Negative for chills and fever.  HENT: Negative for congestion, ear discharge, hearing loss and nosebleeds.   Eyes: Negative for blurred vision.  Respiratory: Negative for cough, shortness of breath and wheezing.   Cardiovascular: Negative for chest pain, palpitations and leg swelling.  Gastrointestinal: Negative for abdominal pain, constipation, diarrhea, nausea and vomiting.  Genitourinary: Negative for dysuria and urgency.  Musculoskeletal: Negative for myalgias.  Neurological: Positive for tremors. Negative for dizziness, speech change, focal weakness, seizures and headaches.  Psychiatric/Behavioral: Negative for depression.    DRUG ALLERGIES:  No Known Allergies  VITALS:  Blood pressure 98/69, pulse (!) 140, temperature 98.5 F (36.9 C), temperature source Oral, resp. rate 18, height 5\' 9"  (1.753 m), weight 104.3 kg (230 lb), SpO2 100 %.  PHYSICAL EXAMINATION:  Physical Exam  GENERAL:  56 y.o.-year-old patient lying in the bed with no acute distress. Significant dyskinesias noted around the lip, tremors of hands and legs EYES: Pupils equal, round, reactive to light and accommodation. No scleral icterus. Extraocular muscles intact. Pale conjunctiva HEENT: Head atraumatic, normocephalic. Oropharynx and nasopharynx clear.  NECK:  Supple, no jugular venous distention. No thyroid enlargement, no tenderness.  LUNGS: Normal breath sounds bilaterally, no wheezing, rales,rhonchi or crepitation. No use of accessory muscles of respiration. Decreased  bibasilar breath sounds noted CARDIOVASCULAR: S1, S2 normal. No  rubs, or gallops. 2/6 systolic murmur present ABDOMEN: Soft, nontender, nondistended. Bowel sounds present. No organomegaly or mass.  EXTREMITIES: No cyanosis, or clubbing. 2+ bilateral pedal edema NEUROLOGIC: Cranial nerves II through XII are intact. Muscle strength 5/5 in all extremities. Global weakness present. Sensation intact. Gait not checked.  PSYCHIATRIC: The patient is alert and oriented x 3.  SKIN: No obvious rash, lesion, or ulcer.    LABORATORY PANEL:   CBC  Recent Labs Lab 02/12/17 1436  WBC 2.5*  HGB 9.0*  HCT 27.6*  PLT 42*   ------------------------------------------------------------------------------------------------------------------  Chemistries   Recent Labs Lab 02/10/17 1256  02/12/17 0404  NA  --   < > 140  K  --   < > 3.8  CL  --   < > 113*  CO2  --   < > 20*  GLUCOSE  --   < > 124*  BUN  --   < > 19  CREATININE  --   < > 1.60*  CALCIUM  --   < > 7.8*  AST 89*  --   --   ALT 20  --   --   ALKPHOS 50  --   --   BILITOT 1.2  --   --   < > = values in this interval not displayed. ------------------------------------------------------------------------------------------------------------------  Cardiac Enzymes  Recent Labs Lab 02/10/17 0204  TROPONINI <0.03   ------------------------------------------------------------------------------------------------------------------  RADIOLOGY:  No results found.  EKG:   Orders placed or performed during the hospital encounter of 02/10/17  . EKG 12-Lead  . EKG 12-Lead  . EKG 12-Lead  . EKG 12-Lead  . EKG 12-Lead  . EKG 12-Lead  .  EKG 12-Lead  . EKG 12-Lead    ASSESSMENT AND PLAN:   56 year old male with past medical history significant for CML started back on K VAC 6 weeks ago, hypertension, schizophrenia and atrial fibrillation not on anticoagulation presents to hospital secondary to worsening weakness  #1  hypotension-and SVT likely due to dehydration: Patient is receiving 1 L of fluid bolus to see if it helps.     #2 .acute renal failure-acute on chronic kidney disease, renal function worsened from creatinine of 1.5 -no obstruction noted on CT Patient's creatinine more than 4 on admission, today on 1.6. Foley is discontinued, patient is voiding.  #3 pancytopenia-secondary to worsening CML versus restarted on Gleevec about 6 weeks ago for symptoms have been persistent for almost 4 weeks now. -Hold Gleevec -Support with transfusion-received 2 units of packed RBC 4, hemoglobin improved from 6.9-9.4. -Currently no indication for platelet transfusion at this time unless there is active bleeding or platelets less than 20 K -Oncology consulted, seen by Dr. Grayland Ormond. Stopped  Gleevac due to  Pancytopenia. #4. schizophrenia, schizoaffective disorder: Seen by Dr. Weber Cooks from Psych; restarted the Depakote, increase the Zyprexa, continue Seroquel, changed and Ativan to Klonopin., Watch closely today. #5 .diabetes mellitus type 2:, Controlled, continue sliding scale with coverage.   #6 DVT prophylaxis-Ted's and SCDs at this time  #7. deconditioning: Physical therapy consulted home health PT.pt from group home and daughter requested SNF,does not want to go there.  All the records are reviewed and case discussed with Care Management/Social Workerr. Management plans discussed with the patient, family and they are in agreement.  CODE STATUS: Full Code  TOTAL TIME TAKING CARE OF THIS PATIENT: 37 minutes.   POSSIBLE D/C IN 2-3 DAYS, DEPENDING ON CLINICAL CONDITION.   Epifanio Lesches M.D on 02/13/2017 at 9:15 AM  Between 7am to 6pm - Pager - [phone]  After 6pm go to [email or web address] - password [email or web address] [facility]  Office  [phone]  CC: Primary care physician; Idelle Crouch, MD

## 2017-02-13 NOTE — Progress Notes (Signed)
Foley cath placed per MD order for acute urinary retention. Pt tolerated procedure.

## 2017-02-14 ENCOUNTER — Inpatient Hospital Stay: Payer: Medicare Other

## 2017-02-14 DIAGNOSIS — F203 Undifferentiated schizophrenia: Secondary | ICD-10-CM

## 2017-02-14 LAB — GLUCOSE, CAPILLARY
Glucose-Capillary: 109 mg/dL — ABNORMAL HIGH (ref 65–99)
Glucose-Capillary: 102 mg/dL — ABNORMAL HIGH (ref 65–99)

## 2017-02-14 MED ORDER — DILTIAZEM HCL 30 MG PO TABS
30.0000 mg | ORAL_TABLET | Freq: Once | ORAL | Status: AC
  Administered 2017-02-14: 30 mg via ORAL
  Filled 2017-02-14: qty 1

## 2017-02-14 MED ORDER — DILTIAZEM HCL 100 MG IV SOLR
5.0000 mg/h | INTRAVENOUS | Status: DC
  Administered 2017-02-14: 10 mg/h via INTRAVENOUS
  Administered 2017-02-14: 5 mg/h via INTRAVENOUS
  Filled 2017-02-14: qty 100

## 2017-02-14 MED ORDER — DILTIAZEM LOAD VIA INFUSION
10.0000 mg | Freq: Once | INTRAVENOUS | Status: AC
  Administered 2017-02-14: 10 mg via INTRAVENOUS
  Filled 2017-02-14 (×2): qty 10

## 2017-02-14 NOTE — Progress Notes (Signed)
Paged MD regarding sustained SVT, HR currently in 140's. Cardiazem 30mg  PO given at 3pm with no effect. Pt denies symptoms. Orders placed for add'l dose of cardiazem and cardiology consult.

## 2017-02-14 NOTE — Consult Note (Addendum)
[facility] Consult   Reason for Consult:  Consult for 56 year old man with a history of schizophrenia or schizoaffective disorder who is currently in the hospital for weakness and anemia Referring Physician:  Tressia Miners Patient Identification: Lawrence Johnston MRN:  [id number] Principal Diagnosis: Schizophrenia [facility]) Diagnosis:   Patient Active Problem List   Diagnosis Date Noted  . Antineoplastic chemotherapy induced pancytopenia ([place]) [D61.810, T45.1X5A]   . Symptomatic anemia [D64.9] 02/10/2017  . 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 MDS (myelodysplastic syndrome) ([place]) [D46.9] 03/11/2016  . 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 Schizophrenia ([place]) [F20.9] 06/19/2013    Total Time spent with patient: 30 minutes  Subjective:   Patient was seen on 10/11/2018to rationalize the dosing of his psychiatry medications. Patient presented to the ED with weakness of lower extremities and frequent falls. Was found to be anemic, and on treatment with Gleevec for chronic myelocytic leukemia.   His QTc was observed to be 550 ms on the 11th. Ongoing maintenance psychotropic regimen comprised fluphenazine 5 mg 2 times daily, fluoxetine 20 mg daily, Depakote extended release 2000 mg at bedtime, Cogentin 0.5 mg 2 times daily, lorazepam 0.5 mg 2 times daily, Ingrezza 80 mg daily.   Psychiatry advised to stop fluphenazine and Ingrezza in the context of his increased QTc interval. Depakote was stopped considering mild elevation in serum ammonia to 14. Thereafter Prolixin was restarted on the 11th with Zyprexa and Seroquel. Lorazepam was transitioned to clonazepam.  Zyprexa was started at 10 mg and Seroquel was started at 100 mg. New regimen advised in favor of optimizing QTC interval.  So patient today and spoke to him. Denies any auditory hallucinations and  was not responding to internal stimuli during the interview. Has tried walking with a walker and is confident that he will be able to continue the process. Continues to have  Oral lingual dyskinesia. He also continues to have tremor in both upper extremities and lower extremities. Patient is also on ropinirole. Wasn't able to tell me if he sees a neurologist who prescribes in the ropinirole.   Social history: Lives in a group home. Not clear to me whether he has a guardian but I think he probably doesn't from what I can see.  Medical history: Has multiple medical problems. Has recently diagnosed leukemia. Acutely he has pancytopenia which has now been a recurrent problem over the last couple months. Possible GI bleed. Significant worsening of renal insufficiency. History of tardive dyskinesia.  Substance abuse history: Not drinking not using any drugs I don't think there is been a significant past history  Past Psychiatric History: [name] Last time was in the distant past. He has been maintained on his current combination of psychiatric medicines that includes Seroquel Depakote Prolixin for at least a few years without any significant change.  Risk to Self: Is patient at risk for suicide?: No Risk to Others:   Prior Inpatient Therapy:   Prior Outpatient Therapy:    Past Medical History:  Past Medical History:  Diagnosis Date  . Arrhythmia    Atrial Fibrillation; Atrial Flutter w/RVR; SVT  . Depression   . Diabetes ([place])   . Dysrhythmia   . GERD (gastroesophageal reflux disease)   . Hypercholesteremia   . Hypertension   . Schizophrenia ([place])   . Sleep apnea   .  Tremor     Past Surgical History:  Procedure Laterality Date  . CARDIOVERSION     x2, a-fib  . COLONOSCOPY WITH PROPOFOL N/A 02/25/2015   Procedure: COLONOSCOPY WITH PROPOFOL;  Surgeon: Josefine Class, MD;  Location: [facility]  ENDOSCOPY;  Service: Endoscopy;  Laterality: N/A;  . COLONOSCOPY WITH PROPOFOL N/A 04/12/2015   Procedure: COLONOSCOPY WITH PROPOFOL;  Surgeon: Josefine Class, MD;  Location: [facility] ENDOSCOPY;  Service: Endoscopy;  Laterality: N/A;   Family History: No family history on file. Family Psychiatric  History: Positive for mood disorder Social History:  History  Alcohol Use No     History  Drug Use No    Social History   Social History  . Marital status: Single    Spouse name: N/A  . Number of children: N/A  . Years of education: N/A   Social History Main Topics  . Smoking status: Never Smoker  . Smokeless tobacco: Never Used  . Alcohol use No  . Drug use: No  . Sexual activity: Not Asked   Other Topics Concern  . None   Social History Narrative  . None   Additional Social History:    Allergies:  No Known Allergies  Labs:  Results for orders placed or performed during the hospital encounter of 02/10/17 (from the past 48 hour(s))  Glucose, capillary     Status: Abnormal   Collection Time: 02/12/17  5:04 PM  Result Value Ref Range   Glucose-Capillary 107 (H) 65 - 99 mg/dL  Glucose, capillary     Status: Abnormal   Collection Time: 02/12/17  9:10 PM  Result Value Ref Range   Glucose-Capillary 128 (H) 65 - 99 mg/dL  Glucose, capillary     Status: Abnormal   Collection Time: 02/13/17  9:59 AM  Result Value Ref Range   Glucose-Capillary 116 (H) 65 - 99 mg/dL  Glucose, capillary     Status: Abnormal   Collection Time: 02/13/17 12:33 PM  Result Value Ref Range   Glucose-Capillary 150 (H) 65 - 99 mg/dL  Glucose, capillary     Status: Abnormal   Collection Time: 02/13/17  4:32 PM  Result Value Ref Range   Glucose-Capillary 110 (H) 65 - 99 mg/dL  Glucose, capillary     Status: None   Collection Time: 02/13/17  9:38 PM  Result Value Ref Range   Glucose-Capillary 91 65 - 99 mg/dL   Comment 1 Notify RN   Glucose, capillary     Status: Abnormal   Collection Time:  02/14/17 11:42 AM  Result Value Ref Range   Glucose-Capillary 109 (H) 65 - 99 mg/dL    Current Facility-Administered Medications  Medication Dose Route Frequency Provider Last Rate Last Dose  . acetaminophen (TYLENOL) tablet 650 mg  650 mg Oral Q6H PRN Harrie Foreman, MD       Or  . acetaminophen (TYLENOL) suppository 650 mg  650 mg Rectal Q6H PRN Harrie Foreman, MD      . benztropine (COGENTIN) tablet 0.5 mg  0.5 mg Oral BID Gladstone Lighter, MD   0.5 mg at 02/14/17 1016  . clonazePAM (KLONOPIN) tablet 0.5 mg  0.5 mg Oral BID Clapacs, John T, MD   0.5 mg at 02/14/17 1014  . diltiazem (CARDIZEM) tablet 30 mg  30 mg Oral Q6H Epifanio Lesches, MD   30 mg at 02/14/17 1513  . diltiazem (CARDIZEM) tablet 30 mg  30 mg Oral Once Epifanio Lesches, MD      .  divalproex (DEPAKOTE ER) 24 hr tablet 2,000 mg  2,000 mg Oral QHS Clapacs, John T, MD   2,000 mg at 02/14/17 1018  . docusate sodium (COLACE) capsule 100 mg  100 mg Oral BID Harrie Foreman, MD   100 mg at 02/14/17 1015  . FLUoxetine (PROZAC) capsule 20 mg  20 mg Oral Daily Harrie Foreman, MD   20 mg at 02/14/17 1016  . gabapentin (NEURONTIN) capsule 300 mg  300 mg Oral QHS Harrie Foreman, MD   300 mg at 02/12/17 2338  . insulin aspart (novoLOG) injection 0-5 Units  0-5 Units Subcutaneous QHS Harrie Foreman, MD      . insulin aspart (novoLOG) injection 0-9 Units  0-9 Units Subcutaneous TID WC Harrie Foreman, MD   1 Units at 02/12/17 1240  . lactulose (CHRONULAC) 10 GM/15ML solution 30 g  30 g Oral BID Epifanio Lesches, MD   30 g at 02/14/17 1016  . metoprolol succinate (TOPROL-XL) 24 hr tablet 25 mg  25 mg Oral BID Epifanio Lesches, MD   25 mg at 02/14/17 1014  . multivitamins with iron tablet 1 tablet  1 tablet Oral Daily Harrie Foreman, MD   1 tablet at 02/14/17 1016  . OLANZapine (ZYPREXA) tablet 10 mg  10 mg Oral QHS Clapacs, Madie Reno, MD   10 mg at 02/12/17 2338  . ondansetron (ZOFRAN) tablet 4  mg  4 mg Oral Q6H PRN Harrie Foreman, MD       Or  . ondansetron [facility]) injection 4 mg  4 mg Intravenous Q6H PRN Harrie Foreman, MD      . pantoprazole (PROTONIX) EC tablet 40 mg  40 mg Oral Daily Harrie Foreman, MD   40 mg at 02/14/17 1014  . polyethylene glycol (MIRALAX / GLYCOLAX) packet 17 g  17 g Oral Daily Harrie Foreman, MD   17 g at 02/11/17 0900  . QUEtiapine (SEROQUEL) tablet 100 mg  100 mg Oral QHS Epifanio Lesches, MD   100 mg at 02/13/17 0000  . rOPINIRole (REQUIP) tablet 2 mg  2 mg Oral QHS Harrie Foreman, MD   2 mg at 02/12/17 2338  . rosuvastatin (CRESTOR) tablet 20 mg  20 mg Oral QHS Harrie Foreman, MD   20 mg at 02/12/17 2338    Musculoskeletal: Strength & Muscle Tone: decreased Gait & Station: unable to stand Patient leans: Backward  Psychiatric Specialty Exam: Physical Exam  Nursing note and vitals reviewed. Constitutional: He appears well-developed.  HENT:  Head: Normocephalic and atraumatic.  Eyes: Pupils are equal, round, and reactive to light. Conjunctivae are normal.  Neck: Normal range of motion.  Cardiovascular: Regular rhythm and normal heart sounds.   Respiratory: Effort normal. No respiratory distress.  GI: Soft.  Musculoskeletal: Normal range of motion.  Neurological: He is alert. He displays tremor.  Patient has oral lingual dyskinesia. Tremor in upper extremities seems to be a combination of rest tremor and action tremor. This is also present in both feet.   Skin: Skin is warm and dry.  Psychiatric: Judgment normal. He is not slowed. Thought content is not paranoid and not delusional. Cognition and memory are normal. He expresses no homicidal and no suicidal ideation.    Review of Systems  HENT: Negative.   Eyes: Negative.   Respiratory: Negative.   Cardiovascular: Negative.   Gastrointestinal: Negative.   Musculoskeletal: Positive for falls.  Skin: Negative.   Neurological: Positive for tremors and weakness.  Psychiatric/Behavioral: Positive for hallucinations. Negative for depression, memory loss, substance abuse and suicidal ideas. The patient is not nervous/anxious and does not have insomnia.     Blood pressure 129/77, pulse (!) 145, temperature 98.9 F (37.2 C), temperature source Oral, resp. rate 18, height 5\' 9"  (1.753 m), weight 265 lb 1.6 oz (120.2 kg), SpO2 97 %.Body mass index is 39.15 kg/m.  General Appearance: Casual  Eye Contact:  Fair  Speech:  Slow  Volume:  Decreased  Mood:  euthymic  Affect:  Much more reactive  Thought Process:  Goal Directed  Orientation:  Full (Time, Place, and Person)  Thought Content:  Does not seem to be responding to internal stimuli  Suicidal Thoughts:  No  Homicidal Thoughts:  No  Memory:  Immediate;   Fair Recent;   Fair Remote;   Fair  Judgement:  Fair  Insight:  Fair  Psychomotor Activity:  Decreased  Concentration:  Concentration: Fair  Recall:  [employer] of Knowledge:  Fair  Language:  Fair  Akathisia:  No  Handed:  Right  AIMS (if indicated):     Assets:  Desire for Improvement Housing  ADL's:  Impaired  Cognition:  Impaired,  Mild  Sleep:        Treatment Plan Summary: 55 year old gentleman with a history of schizophrenia and a psychotropic regimen comprising fluphenazine, fluoxetine, lorazepam and Ingrezza presenting with a history of weakness and falls. On treatment for CML. Psychiatry consulted for optimizing psychotropic medication regimen. QTc interval found to be prolonged > 500 ms.  Psychiatry advised primary teamto modify regimen to optimize QTc prolongation.  On his current regimen which lacks Ingrezza ( which could've been the culprit for causing the QTC prolongation), his QTc interval decreased to 396 ms on the 10/11 and increased to 450 ms on the 10/13.  Discussed this with primary team and an EKG was done again which showed a QTc interval of 448 ms.   Although initial QTc interval was greater than 500 ms, a change  from 396 ms to 450 ms is concerning. At the fact that the QTc interval has remained the same today is reassuring.  Advise primary team to continue the same medications. Zyprexais technically less QTC prolonging than most antipsychotics.   He seems to have orolingual tardive dyskinesia. The movements in his arms and legs seem to be more of a combination of parkinsonian symptoms and essential tremor. He takes ropinirole and I asked him if he has ever seen a neurologist. He was unable to clarify this.   Psychiatry will follow the patient.  Ramond Dial, MD 02/14/2017 4:53 PM

## 2017-02-14 NOTE — Progress Notes (Signed)
Dr. Burt Knack paged with cardiology. Pt still with sustained HR of 140's-150's. Dr. Burt Knack to call back after chart review.

## 2017-02-14 NOTE — Progress Notes (Signed)
Report given to 2A nurse. Pt will be transported shortly.

## 2017-02-14 NOTE — Progress Notes (Signed)
Orders placed for transfer pt to 2-A.

## 2017-02-14 NOTE — Progress Notes (Signed)
[facility] [place] NAME: Lawrence Johnston    MR#:  [id number]  DATE OF BIRTH:  07-25-1960  SUBJECTIVE:  lEss abdominal distention. Denies any complaints. Had good BM yesterday.vutals stable today.  CHIEF COMPLAINT:   Chief Complaint  Patient presents with  . Weakness     REVIEW OF SYSTEMS:  Review of Systems  Constitutional: Negative for chills and fever.  HENT: Negative for congestion, ear discharge, hearing loss and nosebleeds.   Eyes: Negative for blurred vision.  Respiratory: Negative for cough, shortness of breath and wheezing.   Cardiovascular: Negative for chest pain, palpitations and leg swelling.  Gastrointestinal: Negative for abdominal pain, constipation, diarrhea, nausea and vomiting.  Genitourinary: Negative for dysuria and urgency.  Musculoskeletal: Negative for myalgias.  Neurological: Positive for tremors. Negative for dizziness, speech change, focal weakness, seizures and headaches.  Psychiatric/Behavioral: Negative for depression.    DRUG ALLERGIES:  No Known Allergies  VITALS:  Blood pressure 118/82, pulse 76, temperature 98.6 F (37 C), temperature source Oral, resp. rate 18, height 5\' 9"  (1.753 m), weight 120.2 kg (265 lb 1.6 oz), SpO2 95 %.  PHYSICAL EXAMINATION:  Physical Exam  GENERAL:  56 y.o.-year-old patient lying in the bed with no acute distress. Significant dyskinesias noted around the lip, tremors of hands and legs EYES: Pupils equal, round, reactive to light and accommodation. No scleral icterus. Extraocular muscles intact. Pale conjunctiva HEENT: Head atraumatic, normocephalic. Oropharynx and nasopharynx clear.  NECK:  Supple, no jugular venous distention. No thyroid enlargement, no tenderness.  LUNGS: Normal breath sounds bilaterally, no wheezing, rales,rhonchi or crepitation. No use of accessory muscles of respiration. Decreased bibasilar breath sounds noted CARDIOVASCULAR: S1, S2 normal. No   rubs, or gallops. 2/6 systolic murmur present ABDOMEN: slight distension.. Bowel sounds present. No organomegaly or mass.  EXTREMITIES: No cyanosis, or clubbing. 2+ bilateral pedal edema NEUROLOGIC: Cranial nerves II through XII are intact. Muscle strength 5/5 in all extremities. Global weakness present. Sensation intact. Gait not checked.  PSYCHIATRIC: The patient is alert and oriented x 3.  SKIN: No obvious rash, lesion, or ulcer.    LABORATORY PANEL:   CBC  Recent Labs Lab 02/12/17 1436  WBC 2.5*  HGB 9.0*  HCT 27.6*  PLT 42*   ------------------------------------------------------------------------------------------------------------------  Chemistries   Recent Labs Lab 02/10/17 1256  02/12/17 0404  NA  --   < > 140  K  --   < > 3.8  CL  --   < > 113*  CO2  --   < > 20*  GLUCOSE  --   < > 124*  BUN  --   < > 19  CREATININE  --   < > 1.60*  CALCIUM  --   < > 7.8*  AST 89*  --   --   ALT 20  --   --   ALKPHOS 50  --   --   BILITOT 1.2  --   --   < > = values in this interval not displayed. ------------------------------------------------------------------------------------------------------------------  Cardiac Enzymes  Recent Labs Lab 02/10/17 0204  TROPONINI <0.03   ------------------------------------------------------------------------------------------------------------------  RADIOLOGY:  Dg Abd 1 View  Result Date: 02/14/2017 CLINICAL DATA:  Abdominal distension. EXAM: ABDOMEN - 1 VIEW COMPARISON:  Radiographs of February 13, 2017. FINDINGS: There is significantly decreased bowel distention compared to prior exam, consistent with improving ileus. Gas is noted in the rectum. No radio-opaque calculi or other significant radiographic abnormality are seen. IMPRESSION:  Significantly decreased bowel distention compared to prior exam, consistent with improving ileus. Electronically Signed   By: Marijo Conception, M.D.   On: 02/14/2017 08:32   Dg Chest Port 1  View  Result Date: 02/13/2017 CLINICAL DATA:  NG tube placement. EXAM: PORTABLE CHEST 1 VIEW COMPARISON:  02/13/2017 FINDINGS: Catheter projects over the mid thorax, tip is not visualized. Cardiomediastinal silhouette is normal for portable technique. Mediastinal contours appear intact. There is no evidence of focal airspace consolidation, pleural effusion or pneumothorax. Low lung volumes Osseous structures are without acute abnormality. Soft tissues are grossly normal. IMPRESSION: The tip of the enteric catheter is not visualized. Low lung volumes. Electronically Signed   By: Fidela Salisbury M.D.   On: 02/13/2017 15:15   Dg Chest Port 1 View  Result Date: 02/13/2017 CLINICAL DATA:  Dizziness, GI bleeding and hypotension. EXAM: PORTABLE CHEST 1 VIEW COMPARISON:  10/27/2011 FINDINGS: The heart size and mediastinal contours are within normal limits. Lung volumes are low bilaterally. There is elevation of the right hemidiaphragm. Bibasilar scarring and atelectasis present. There is no evidence of pulmonary edema, consolidation, pneumothorax, nodule or pleural fluid. The visualized skeletal structures are unremarkable. IMPRESSION: Low lung volumes with bibasilar scarring/ atelectasis. Electronically Signed   By: Aletta Edouard M.D.   On: 02/13/2017 13:57   Dg Abd Portable 1v  Result Date: 02/13/2017 CLINICAL DATA:  Abdominal distention.  GI bleed and hypotension. EXAM: PORTABLE ABDOMEN - 1 VIEW COMPARISON:  CT of the abdomen and pelvis on 02/10/2017 FINDINGS: Increased gaseous distention of bowel since the recent CT study, especially of the colon. Some gas is also present in small bowel. Findings are consistent with ileus. No gross free air or pneumatosis identified. IMPRESSION: Increase gaseous distention of primarily the colon is consistent with ileus. Electronically Signed   By: Aletta Edouard M.D.   On: 02/13/2017 13:55    EKG:   Orders placed or performed during the hospital encounter of  02/10/17  . EKG 12-Lead  . EKG 12-Lead  . EKG 12-Lead  . EKG 12-Lead  . EKG 12-Lead  . EKG 12-Lead  . EKG 12-Lead  . EKG 12-Lead  . EKG 12-Lead  . EKG 12-Lead  . EKG 12-Lead  . EKG 12-Lead  . EKG 12-Lead  . EKG 12-Lead  . EKG 12-Lead  . EKG 12-Lead    ASSESSMENT AND PLAN:   56 year old male with past medical history significant for CML started back on K VAC 6 weeks ago, hypertension, schizophrenia and atrial fibrillation not on anticoagulation presents to hospital secondary to worsening weakness  #.1 hypotension-and SVT likely due to dehydration: improved  after aggressive hydration.   #2 .acute renal failure-acute on chronic kidney disease, renal function worsened from creatinine of 1.5 -no obstruction noted on CT Patient's creatinine more than 4 on admission, today on 1.6. Urine retention with more than 600 mL of urine by bladder scan yesterday, reinserted Foley catheter, consult urology for urinary retention.  #3 pancytopenia-secondary to worsening CML versus restarted on Gleevec about 6 weeks ago for symptoms have been persistent for almost 4 weeks now. -Hold Gleevec -Support with transfusion-received 2 units of packed RBC 4, hemoglobin improved from 6.9-9.4. -Currently no indication for platelet transfusion at this time unless there is active bleeding or platelets less than 20 K -Oncology consulted, seen by Dr. Grayland Ormond. Stopped  Gleevac due to  Pancytopenia.  #4. schizophrenia, schizoaffective disorder: Seen by Dr. Weber Cooks from Psych; restarted the Depakote, increase the Zyprexa, continue Seroquel, changed  and Ativan to Klonopin., Watch closely  #5 .diabetes mellitus type 2:, Controlled, continue sliding scale with coverage.   #6 DVT prophylaxis-Ted's and SCDs at this time  #7. deconditioning: Physical therapy consulted home health PT.pt from group home and daughter requested SNF,does not want to go there.  #8/ adynamic ileus: Use stool softeners, abdominal x-ray  today showed less gaseous distention than yesterday, start clear liquids, decrease and stop IV fluids if tolerate by mouth Discussed with patient. 9/ bilateral atelectasis. Discontinue IV antibiotics as chest x-ray is negative for pneumonia. All the records are reviewed and case discussed with Care Management/Social Workerr. Management plans discussed with the patient, family and they are in agreement.  CODE STATUS: Full Code  TOTAL TIME TAKING CARE OF THIS PATIENT: 37 minutes.   POSSIBLE D/C IN 2-3 DAYS, DEPENDING ON CLINICAL CONDITION.   Epifanio Lesches M.D on 02/14/2017 at 10:09 AM  Between 7am to 6pm - Pager - [phone]  After 6pm go to [email or web address] - password [email or web address] [facility] [place] [facility]  Office  [phone]  CC: Primary care physician; Idelle Crouch, MD

## 2017-02-14 NOTE — Progress Notes (Signed)
Patient ID: Lawrence Johnston, male   DOB: 10-Jan-1961, 56 y.o.   MRN: [id number]       [facility] [facility] Day(s): 4.   Post op day(s):  n/a.   Interval History: Patient seen and examined, no acute events or new complaints overnight. Patient reports "passing flatus 36 times since yesterday", denies abdominal pain, nausea or vomiting. Refers last bowel movement was last night. Also refers having coffee and green jello this morning.   Review of Systems:  Constitutional: denies fever, chills  HEENT: denies cough or congestion  Respiratory: denies any shortness of breath  Cardiovascular: denies chest pain or palpitations  Gastrointestinal: denies abdominal pain, N/V, or diarrhea/and bowel function as per interval history Genitourinary: has foley catheter Musculoskeletal: denies pain, decreased motor or sensation Integumentary: denies rashes or skin discolorations Neurological: denies HA or vision/hearing changes   Vital signs in last 24 hours: [min-max] current  Temp:  [98 F (36.7 C)-98.6 F (37 C)] 98.6 F (37 C) (10/14 0754) Pulse Rate:  [76-116] 76 (10/14 0754) Resp:  [18] 18 (10/13 1607) BP: (112-170)/(64-136) 118/82 (10/14 0754) SpO2:  [95 %-97 %] 95 % (10/14 0754) Weight:  [120.2 kg (265 lb 1.6 oz)] 120.2 kg (265 lb 1.6 oz) (10/14 0500)     Height: 5\' 9"  (175.3 cm) Weight: 120.2 kg (265 lb 1.6 oz) BMI (Calculated): 39.13   Intake/Output this shift:  Total I/O In: 240 [P.O.:240] Out: -    Physical Exam:  Constitutional: alert, cooperative and no distress  HENT: normocephalic without obvious abnormality  Eyes:  EOM's grossly intact and symmetric  Respiratory: breathing non-labored at rest  Cardiovascular: regular rate and sinus rhythm  Gastrointestinal: soft, non-tender, and globose, dull to percussion. No guarding  Labs:  CBC Latest Ref Rng & Units 02/12/2017 02/10/2017 02/10/2017  WBC 3.8 - 10.6 K/uL 2.5(L) 2.7(L) 2.3(L)  Hemoglobin 13.0 - 18.0 g/dL 9.0(L)  9.4(L) 6.9(L)  Hematocrit 40.0 - 52.0 % 27.6(L) 28.0(L) 20.9(L)  Platelets 150 - 440 K/uL 42(L) 60(L) 59(L)   CMP Latest Ref Rng & Units 02/12/2017 02/11/2017 02/10/2017  Glucose 65 - 99 mg/dL 124(H) 106(H) 141(H)  BUN 6 - 20 mg/dL 19 24(H) 43(H)  Creatinine 0.61 - 1.24 mg/dL 1.60(H) 1.81(H) 4.04(H)  Sodium 135 - 145 mmol/L 140 140 140  Potassium 3.5 - 5.1 mmol/L 3.8 3.8 4.1  Chloride 101 - 111 mmol/L 113(H) 110 108  CO2 22 - 32 mmol/L 20(L) 24 19(L)  Calcium 8.9 - 10.3 mg/dL 7.8(L) 8.0(L) 7.7(L)  Total Protein 6.5 - 8.1 g/dL - - 5.2(L)  Total Bilirubin 0.3 - 1.2 mg/dL - - 1.2  Alkaline Phos 38 - 126 U/L - - 50  AST 15 - 41 U/L - - 89(H)  ALT 17 - 63 U/L - - 20    Imaging studies: No new pertinent imaging studies  Assessment/Plan:  56 y.o. male with adynamic ileus, complicated by pertinent comorbidities including ascites, leukopenia, thrombocytopenia, chronic myelogenous leukemia, among others. The patient has been feeling better since yesterday. Patient is passing stool and gas per rectum multiple times. No abdominal tenderness, nausea or vomiting. Patient tolerated clear liquid diet. No surgical management at this moment. Recommend to progress diet as tolerated.   All of the above findings and recommendations were discussed with the patient and questions were answered to his expressed satisfaction.

## 2017-02-14 NOTE — Progress Notes (Signed)
Called by Magda Paganini, RN taking care of Mr Satter regarding sustained narrow complex tachycardia. Chart reviewed, EKG and rhythm strips reviewed. Suspect he may have an atypical atrial flutter with HR 146 bpm. BP stable and patient reportedly asymptomatic. Order placed to DC or diltiazem and start Cardizem gtt with 10 mg bolus injection. Pt will be moved to a telemetry floor. Cardiology team will see for formal consultation tomorrow am.   Sherren Mocha 02/14/2017 7:08 PM

## 2017-02-14 NOTE — Progress Notes (Signed)
MD notified of EKG result. Dr. Drue Second will speak with attending.

## 2017-02-14 NOTE — Progress Notes (Signed)
Pt Alert and oriented. VSS prior to transfer except HR 148. RN assisted in transfer to rm 245.

## 2017-02-15 DIAGNOSIS — Z794 Long term (current) use of insulin: Secondary | ICD-10-CM

## 2017-02-15 DIAGNOSIS — K567 Ileus, unspecified: Secondary | ICD-10-CM

## 2017-02-15 DIAGNOSIS — R251 Tremor, unspecified: Secondary | ICD-10-CM

## 2017-02-15 DIAGNOSIS — I499 Cardiac arrhythmia, unspecified: Secondary | ICD-10-CM

## 2017-02-15 LAB — CBC WITH DIFFERENTIAL/PLATELET
BASOS PCT: 1 %
Basophils Absolute: 0 10*3/uL (ref 0–0.1)
EOS ABS: 0 10*3/uL (ref 0–0.7)
EOS PCT: 3 %
HCT: 26.2 % — ABNORMAL LOW (ref 40.0–52.0)
Hemoglobin: 8.5 g/dL — ABNORMAL LOW (ref 13.0–18.0)
LYMPHS ABS: 0.3 10*3/uL — AB (ref 1.0–3.6)
Lymphocytes Relative: 21 %
MCH: 31.2 pg (ref 26.0–34.0)
MCHC: 32.4 g/dL (ref 32.0–36.0)
MCV: 96.4 fL (ref 80.0–100.0)
MONOS PCT: 22 %
Monocytes Absolute: 0.3 10*3/uL (ref 0.2–1.0)
Neutro Abs: 0.8 10*3/uL — ABNORMAL LOW (ref 1.4–6.5)
Neutrophils Relative %: 53 %
PLATELETS: 40 10*3/uL — AB (ref 150–440)
RBC: 2.71 MIL/uL — ABNORMAL LOW (ref 4.40–5.90)
RDW: 19.9 % — ABNORMAL HIGH (ref 11.5–14.5)
WBC: 1.4 10*3/uL — AB (ref 3.8–10.6)

## 2017-02-15 LAB — GLUCOSE, CAPILLARY
Glucose-Capillary: 101 mg/dL — ABNORMAL HIGH (ref 65–99)
Glucose-Capillary: 127 mg/dL — ABNORMAL HIGH (ref 65–99)
Glucose-Capillary: 131 mg/dL — ABNORMAL HIGH (ref 65–99)
Glucose-Capillary: 92 mg/dL (ref 65–99)
Glucose-Capillary: 107 mg/dL — ABNORMAL HIGH (ref 65–99)

## 2017-02-15 MED ORDER — AMIODARONE HCL 200 MG PO TABS
200.0000 mg | ORAL_TABLET | Freq: Two times a day (BID) | ORAL | Status: DC
  Administered 2017-02-15 – 2017-02-19 (×9): 200 mg via ORAL
  Filled 2017-02-15 (×9): qty 1

## 2017-02-15 MED ORDER — AMIODARONE HCL IN DEXTROSE 360-4.14 MG/200ML-% IV SOLN
60.0000 mg/h | INTRAVENOUS | Status: DC
  Administered 2017-02-15: 60 mg/h via INTRAVENOUS

## 2017-02-15 MED ORDER — AMIODARONE HCL IN DEXTROSE 360-4.14 MG/200ML-% IV SOLN
30.0000 mg/h | INTRAVENOUS | Status: DC
  Administered 2017-02-15: 30 mg/h via INTRAVENOUS
  Filled 2017-02-15: qty 200

## 2017-02-15 MED ORDER — AMIODARONE HCL IN DEXTROSE 360-4.14 MG/200ML-% IV SOLN
INTRAVENOUS | Status: AC
  Administered 2017-02-15: 60 mg/h via INTRAVENOUS
  Filled 2017-02-15: qty 200

## 2017-02-15 NOTE — Progress Notes (Signed)
Physical Therapy Treatment Patient Details Name: Lawrence Johnston MRN: [id number] DOB: [birth year] Today's Date: 02/15/2017    History of Present Illness 56 y/o male here from group home with weakness.  History includes hypertension, schizophrenia and atrial fibrillation.    PT Comments    Pt lethargic, but agreeable to PT. Reports abdominal pain, but does not quantify; no notable distress. Pt does require re awakening several times during session. Pt agreeable to supine exercises only this session; participates with assist as needed. Movement throughout Bilateral lower extremities is slow and with effort, pt gets tremors in bilateral upper extremities. Continue PT to progress strength and endurance to improve functional mobility.    Follow Up Recommendations  Home health PT     Equipment Recommendations       Recommendations for Other Services       Precautions / Restrictions Precautions Precautions: Fall Restrictions Weight Bearing Restrictions: No    Mobility  Bed Mobility               General bed mobility comments: Not tested; refuses out of bed.   Transfers                    Ambulation/Gait                 Stairs            Wheelchair Mobility    Modified Rankin (Stroke Patients Only)       Balance                                            Cognition Arousal/Alertness: Lethargic Behavior During Therapy: Flat affect Overall Cognitive Status: Within Functional Limits for tasks assessed                                        Exercises General Exercises - Lower Extremity Ankle Circles/Pumps: AROM;Both;20 reps;Supine Quad Sets: Strengthening;Both;20 reps;Supine Gluteal Sets: Strengthening;Both;20 reps;Supine Short Arc Quad: AROM;Both;20 reps;Supine Heel Slides: AROM;Both;20 reps;Supine Hip ABduction/ADduction: AAROM;Both;20 reps;Supine Straight Leg Raises: AAROM;Both;20 reps;Supine     General Comments        Pertinent Vitals/Pain      Home Living                      Prior Function            PT Goals (current goals can now be found in the care plan section) Progress towards PT goals: Not progressing toward goals - comment    Frequency    Min 2X/week      PT Plan Current plan remains appropriate    Co-evaluation              AM-PAC PT "6 Clicks" Daily Activity  Outcome Measure  Difficulty turning over in bed (including adjusting bedclothes, sheets and blankets)?: Unable Difficulty moving from lying on back to sitting on the side of the bed? : Unable Difficulty sitting down on and standing up from a chair with arms (e.g., wheelchair, bedside commode, etc,.)?: Unable Help needed moving to and from a bed to chair (including a wheelchair)?: Total Help needed walking in hospital room?: Total Help needed climbing 3-5 steps with a railing? : Total 6 Click Score: 6  End of Session   Activity Tolerance: Patient limited by fatigue;Patient limited by lethargy Patient left: in bed;with call bell/phone within reach;with bed alarm set   PT Visit Diagnosis: Muscle weakness (generalized) (M62.81);Difficulty in walking, not elsewhere classified (R26.2)     Time: 2778-2423 PT Time Calculation (min) (ACUTE ONLY): 31 min  Charges:  $Therapeutic Exercise: 23-37 mins                    G Codes:        [name], PTA 02/15/2017, 3:00 PM

## 2017-02-15 NOTE — Consult Note (Signed)
[facility] Consult   Reason for Consult:  Consult for 56 year old man with a history of schizophrenia or schizoaffective disorder who is currently in the hospital for weakness and anemia Referring Physician:  Tressia Miners Patient Identification: Lawrence Johnston MRN:  [id number] Principal Diagnosis: Schizophrenia [facility]) Diagnosis:   Patient Active Problem List   Diagnosis Date Noted  . Antineoplastic chemotherapy induced pancytopenia ([place]) [D61.810, T45.1X5A]   . Symptomatic anemia [D64.9] 02/10/2017  . CML (chronic myeloid leukemia) ([place]) [C92.10] 03/22/2016  . 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 Schizophrenia ([place]) [F20.9] 06/19/2013    Total Time spent with patient: 20 minutes  Subjective:   Patient was seen on 10/11/2018to rationalize the dosing of his psychiatry medications. Patient presented to the ED with weakness of lower extremities and frequent falls. Was found to be anemic, and on treatment with Gleevec for chronic myelocytic leukemia.   His QTc was observed to be 550 ms on the 11th. Ongoing maintenance psychotropic regimen comprised fluphenazine 5 mg 2 times daily, fluoxetine 20 mg daily, Depakote extended release 2000 mg at bedtime, Cogentin 0.5 mg 2 times daily, lorazepam 0.5 mg 2 times daily, Ingrezza 80 mg daily.   Psychiatry advised to stop fluphenazine and Ingrezza in the context of his increased QTc interval. Depakote was stopped considering mild elevation in serum ammonia to 14. Thereafter Prolixin was restarted on the 11th with Zyprexa and Seroquel. Lorazepam was transitioned to clonazepam.  Zyprexa was started at 10 mg and Seroquel was started at 100 mg. New regimen advised in favor of optimizing QTC interval.  So patient today and spoke to him. Denies any auditory hallucinations and  was not responding to internal stimuli during the interview. Has tried walking with a walker and is confident that he will be able to continue the process. Continues to have  Oral lingual dyskinesia. He also continues to have tremor in both upper extremities and lower extremities. Patient is also on ropinirole. Wasn't able to tell me if he sees a neurologist who prescribes in the ropinirole.   Follow-up 56 year old man with chronic mental health problems. Patient has no complaints today, Monday the 15th. Denies any suicidal intent or plan although he admits to occasional suicidal thoughts. Still has hallucinations although they are only slightly worse than usual. Patient does not appear to be particularly distressed. Able to interact appropriately.  Social history: Lives in a group home. Not clear to me whether he has a guardian but I think he probably doesn't from what I can see.  Medical history: Has multiple medical problems. Has recently diagnosed leukemia. Acutely he has pancytopenia which has now been a recurrent problem over the last couple months. Possible GI bleed. Significant worsening of renal insufficiency. History of tardive dyskinesia.  Substance abuse history: Not drinking not using any drugs I don't think there is been a significant past history  Past Psychiatric History: [name] Last time was in the distant past. He has been maintained on his current combination of psychiatric medicines that includes Seroquel Depakote Prolixin for at least a few years without any significant change.  Risk to Self: Is patient at risk for suicide?: No Risk to Others:   Prior Inpatient Therapy:   Prior Outpatient Therapy:    Past Medical History:  Past Medical History:  Diagnosis  Date  . Arrhythmia    Atrial Fibrillation; Atrial Flutter w/RVR; SVT  . Depression   . Diabetes ([place])   . Dysrhythmia    . GERD (gastroesophageal reflux disease)   . Hypercholesteremia   . Hypertension   . Schizophrenia ([place])   . Sleep apnea   . Tremor     Past Surgical History:  Procedure Laterality Date  . CARDIOVERSION     x2, a-fib  . COLONOSCOPY WITH PROPOFOL N/A 02/25/2015   Procedure: COLONOSCOPY WITH PROPOFOL;  Surgeon: Josefine Class, MD;  Location: [facility] ENDOSCOPY;  Service: Endoscopy;  Laterality: N/A;  . COLONOSCOPY WITH PROPOFOL N/A 04/12/2015   Procedure: COLONOSCOPY WITH PROPOFOL;  Surgeon: Josefine Class, MD;  Location[facility] ENDOSCOPY;  Service: Endoscopy;  Laterality: N/A;   Family History: History reviewed. No pertinent family history. Family Psychiatric  History: Positive for mood disorder Social History:  History  Alcohol Use No     History  Drug Use No    Social History   Social History  . Marital status: Single    Spouse name: N/A  . Number of children: N/A  . Years of education: N/A   Social History Main Topics  . Smoking status: Never Smoker  . Smokeless tobacco: Never Used  . Alcohol use No  . Drug use: No  . Sexual activity: Not Asked   Other Topics Concern  . None   Social History Narrative  . None   Additional Social History:    Allergies:  No Known Allergies  Labs:  Results for orders placed or performed during the hospital encounter of 02/10/17 (from the past 48 hour(s))  Glucose, capillary     Status: None   Collection Time: 02/13/17  9:38 PM  Result Value Ref Range   Glucose-Capillary 91 65 - 99 mg/dL   Comment 1 Notify RN   Glucose, capillary     Status: Abnormal   Collection Time: 02/14/17 11:42 AM  Result Value Ref Range   Glucose-Capillary 109 (H) 65 - 99 mg/dL  Glucose, capillary     Status: Abnormal   Collection Time: 02/14/17  4:00 PM  Result Value Ref Range   Glucose-Capillary 101 (H) 65 - 99 mg/dL  Glucose, capillary     Status: Abnormal   Collection Time: 02/14/17  8:20 PM  Result Value Ref Range   Glucose-Capillary  102 (H) 65 - 99 mg/dL  Glucose, capillary     Status: Abnormal   Collection Time: 02/15/17  7:45 AM  Result Value Ref Range   Glucose-Capillary 127 (H) 65 - 99 mg/dL  CBC with Differential     Status: Abnormal   Collection Time: 02/15/17 10:52 AM  Result Value Ref Range   WBC 1.4 (LL) 3.8 - 10.6 K/uL    Comment: CRITICAL RESULT CALLED TO, READ BACK BY AND VERIFIED WITH: Abbigail Jackson @ 1118 02/15/17 TCH    RBC 2.71 (L) 4.40 - 5.90 MIL/uL   Hemoglobin 8.5 (L) 13.0 - 18.0 g/dL   HCT 26.2 (L) 40.0 - 52.0 %   MCV 96.4 80.0 - 100.0 fL   MCH 31.2 26.0 - 34.0 pg   MCHC 32.4 32.0 - 36.0 g/dL   RDW 19.9 (H) 11.5 - 14.5 %   Platelets 40 (L) 150 - 440 K/uL   Neutrophils Relative % 53 %   Neutro Abs 0.8 (L) 1.4 - 6.5 K/uL   Lymphocytes Relative 21 %   Lymphs Abs 0.3 (L) 1.0 - 3.6 K/uL  Monocytes Relative 22 %   Monocytes Absolute 0.3 0.2 - 1.0 K/uL   Eosinophils Relative 3 %   Eosinophils Absolute 0.0 0 - 0.7 K/uL   Basophils Relative 1 %   Basophils Absolute 0.0 0 - 0.1 K/uL  Glucose, capillary     Status: Abnormal   Collection Time: 02/15/17 11:22 AM  Result Value Ref Range   Glucose-Capillary 131 (H) 65 - 99 mg/dL  Glucose, capillary     Status: None   Collection Time: 02/15/17  4:10 PM  Result Value Ref Range   Glucose-Capillary 92 65 - 99 mg/dL    Current Facility-Administered Medications  Medication Dose Route Frequency Provider Last Rate Last Dose  . acetaminophen (TYLENOL) tablet 650 mg  650 mg Oral Q6H PRN Harrie Foreman, MD       Or  . acetaminophen (TYLENOL) suppository 650 mg  650 mg Rectal Q6H PRN Harrie Foreman, MD      . amiodarone (PACERONE) tablet 200 mg  200 mg Oral BID Teodoro Spray, MD   200 mg at 02/15/17 1316  . benztropine (COGENTIN) tablet 0.5 mg  0.5 mg Oral BID Gladstone Lighter, MD   0.5 mg at 02/15/17 0818  . clonazePAM (KLONOPIN) tablet 0.5 mg  0.5 mg Oral BID Clapacs, Madie Reno, MD   0.5 mg at 02/15/17 0818  . divalproex (DEPAKOTE ER) 24  hr tablet 2,000 mg  2,000 mg Oral QHS Clapacs, John T, MD   2,000 mg at 02/15/17 0819  . docusate sodium (COLACE) capsule 100 mg  100 mg Oral BID Harrie Foreman, MD   100 mg at 02/15/17 0818  . FLUoxetine (PROZAC) capsule 20 mg  20 mg Oral Daily Harrie Foreman, MD   20 mg at 02/15/17 3329  . gabapentin (NEURONTIN) capsule 300 mg  300 mg Oral QHS Harrie Foreman, MD   300 mg at 02/14/17 2124  . insulin aspart (novoLOG) injection 0-5 Units  0-5 Units Subcutaneous QHS Harrie Foreman, MD      . insulin aspart (novoLOG) injection 0-9 Units  0-9 Units Subcutaneous TID WC Harrie Foreman, MD   1 Units at 02/15/17 1147  . lactulose (CHRONULAC) 10 GM/15ML solution 30 g  30 g Oral BID Epifanio Lesches, MD   30 g at 02/15/17 5188  . multivitamins with iron tablet 1 tablet  1 tablet Oral Daily Harrie Foreman, MD   1 tablet at 02/15/17 [phone]  . OLANZapine (ZYPREXA) tablet 10 mg  10 mg Oral QHS Clapacs, Madie Reno, MD   10 mg at 02/14/17 2124  . ondansetron (ZOFRAN) tablet 4 mg  4 mg Oral Q6H PRN Harrie Foreman, MD       Or  . ondansetron [facility]) injection 4 mg  4 mg Intravenous Q6H PRN Harrie Foreman, MD      . pantoprazole (PROTONIX) EC tablet 40 mg  40 mg Oral Daily Harrie Foreman, MD   40 mg at 02/15/17 0818  . polyethylene glycol (MIRALAX / GLYCOLAX) packet 17 g  17 g Oral Daily Harrie Foreman, MD   17 g at 02/15/17 0820  . QUEtiapine (SEROQUEL) tablet 100 mg  100 mg Oral QHS Epifanio Lesches, MD   100 mg at 02/14/17 2125  . rOPINIRole (REQUIP) tablet 2 mg  2 mg Oral QHS Harrie Foreman, MD   2 mg at 02/14/17 2124  . rosuvastatin (CRESTOR) tablet 20 mg  20 mg Oral QHS Rosilyn Mings  S, MD   20 mg at 02/14/17 2124    Musculoskeletal: Strength & Muscle Tone: decreased Gait & Station: unable to stand Patient leans: Backward  Psychiatric Specialty Exam: Physical Exam  Nursing note and vitals reviewed. Constitutional: He appears well-developed.  HENT:   Head: Normocephalic and atraumatic.  Eyes: Pupils are equal, round, and reactive to light. Conjunctivae are normal.  Neck: Normal range of motion.  Cardiovascular: Regular rhythm and normal heart sounds.   Respiratory: Effort normal. No respiratory distress.  GI: Soft.  Musculoskeletal: Normal range of motion.  Neurological: He is alert. He displays tremor.  Patient has oral lingual dyskinesia. Tremor in upper extremities seems to be a combination of rest tremor and action tremor. This is also present in both feet.   Skin: Skin is warm and dry.  Psychiatric: Judgment normal. He is not slowed. Thought content is not paranoid and not delusional. Cognition and memory are normal. He expresses no homicidal and no suicidal ideation.    Review of Systems  HENT: Negative.   Eyes: Negative.   Respiratory: Negative.   Cardiovascular: Negative.   Gastrointestinal: Negative.   Musculoskeletal: Positive for falls.  Skin: Negative.   Neurological: Positive for tremors and weakness.  Psychiatric/Behavioral: Positive for hallucinations. Negative for depression, memory loss, substance abuse and suicidal ideas. The patient is not nervous/anxious and does not have insomnia.     Blood pressure 116/75, pulse 71, temperature 98.3 F (36.8 C), temperature source Oral, resp. rate 18, height 5\' 9"  (1.753 m), weight 118.5 kg (261 lb 4.8 oz), SpO2 98 %.Body mass index is 38.59 kg/m.  General Appearance: Casual  Eye Contact:  Fair  Speech:  Slow  Volume:  Decreased  Mood:  euthymic  Affect:  Much more reactive  Thought Process:  Goal Directed  Orientation:  Full (Time, Place, and Person)  Thought Content:  Does not seem to be responding to internal stimuli  Suicidal Thoughts:  No  Homicidal Thoughts:  No  Memory:  Immediate;   Fair Recent;   Fair Remote;   Fair  Judgement:  Fair  Insight:  Fair  Psychomotor Activity:  Decreased  Concentration:  Concentration: Fair  Recall:  [employer] of  Knowledge:  Fair  Language:  Fair  Akathisia:  No  Handed:  Right  AIMS (if indicated):     Assets:  Desire for Improvement Housing  ADL's:  Impaired  Cognition:  Impaired,  Mild  Sleep:        Treatment Plan Summary: No change to medication today. Supportive counseling. Review of notes from the weekend. I will follow as needed.  Psychiatry will follow the patient.  Alethia Berthold, MD 02/15/2017 8:27 PM

## 2017-02-15 NOTE — Progress Notes (Signed)
Dr Vianne Bulls was informed about pt WBC 1.4, no new order at this time , will continue to monitor.

## 2017-02-15 NOTE — Progress Notes (Signed)
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  INTERVAL HISTORY: Patient feels improved since hospital admission. He has no abdominal pain. He has a fair appetite. Patient offers no specific complaints today.  REVIEW OF SYSTEMS:   Review of Systems  Constitutional: Positive for malaise/fatigue. Negative for fever and weight loss.  Respiratory: Negative.  Negative for cough and shortness of breath.   Cardiovascular: Negative.  Negative for chest pain and leg swelling.  Gastrointestinal: Negative for abdominal pain, diarrhea, nausea and vomiting.  Genitourinary: Negative.   Musculoskeletal: Negative.   Skin: Negative.  Negative for rash.  Neurological: Positive for weakness.  Psychiatric/Behavioral: Negative.  The patient is not nervous/anxious.     As per HPI. Otherwise, a complete review of systems is negative.  PAST MEDICAL HISTORY: Past Medical History:  Diagnosis Date  . Arrhythmia    Atrial Fibrillation; Atrial Flutter w/RVR; SVT  . Depression   . Diabetes ([place])   . Dysrhythmia   . GERD (gastroesophageal reflux disease)   . Hypercholesteremia   . Hypertension   . Schizophrenia ([place])   . Sleep apnea   . Tremor     PAST SURGICAL HISTORY: Past Surgical History:  Procedure Laterality Date  . CARDIOVERSION     x2, a-fib  . COLONOSCOPY WITH PROPOFOL N/A 02/25/2015   Procedure: COLONOSCOPY WITH PROPOFOL;  Surgeon: Josefine Class, MD;  Location: [facility] ENDOSCOPY;  Service: Endoscopy;  Laterality: N/A;  . COLONOSCOPY WITH PROPOFOL N/A 04/12/2015   Procedure: COLONOSCOPY WITH PROPOFOL;  Surgeon: Josefine Class, MD;  Location: [facility] ENDOSCOPY;  Service: Endoscopy;  Laterality: N/A;    FAMILY HISTORY: History reviewed. No pertinent family  history.  ADVANCED DIRECTIVES (Y/N):  @ADVDIR @  HEALTH MAINTENANCE: Social History  Substance Use Topics  . Smoking status: Never Smoker  . Smokeless tobacco: Never Used  . Alcohol use No     Colonoscopy:  PAP:  Bone density:  Lipid panel:  No Known Allergies  Current Facility-Administered Medications  Medication Dose Route Frequency Provider Last Rate Last Dose  . acetaminophen (TYLENOL) tablet 650 mg  650 mg Oral Q6H PRN Harrie Foreman, MD       Or  . acetaminophen (TYLENOL) suppository 650 mg  650 mg Rectal Q6H PRN Harrie Foreman, MD      . amiodarone (NEXTERONE PREMIX) 360-4.14 MG/200ML-% (1.8 mg/mL) IV infusion  30 mg/hr Intravenous Continuous Pyreddy, Reatha Harps, MD 16.7 mL/hr at 02/15/17 0939 30 mg/hr at 02/15/17 0939  . amiodarone (PACERONE) tablet 200 mg  200 mg Oral BID Teodoro Spray, MD   200 mg at 02/15/17 1316  . benztropine (COGENTIN) tablet 0.5 mg  0.5 mg Oral BID Gladstone Lighter, MD   0.5 mg at 02/15/17 0818  . clonazePAM (KLONOPIN) tablet 0.5 mg  0.5 mg Oral BID Clapacs, Madie Reno, MD   0.5 mg at 02/15/17 0818  . divalproex (DEPAKOTE ER) 24 hr tablet 2,000 mg  2,000 mg Oral QHS Clapacs, John T, MD   2,000 mg at 02/15/17 0819  . docusate sodium (COLACE) capsule 100 mg  100 mg Oral BID Harrie Foreman, MD   100 mg at 02/15/17 0818  . FLUoxetine (PROZAC) capsule 20 mg  20 mg Oral Daily Harrie Foreman, MD   20 mg at 02/15/17 3614  . gabapentin (  NEURONTIN) capsule 300 mg  300 mg Oral QHS Harrie Foreman, MD   300 mg at 02/14/17 2124  . insulin aspart (novoLOG) injection 0-5 Units  0-5 Units Subcutaneous QHS Harrie Foreman, MD      . insulin aspart (novoLOG) injection 0-9 Units  0-9 Units Subcutaneous TID WC Harrie Foreman, MD   1 Units at 02/15/17 1147  . lactulose (CHRONULAC) 10 GM/15ML solution 30 g  30 g Oral BID Epifanio Lesches, MD   30 g at 02/15/17 8182  . metoprolol succinate (TOPROL-XL) 24 hr tablet 25 mg  25 mg Oral BID Epifanio Lesches, MD   25 mg at 02/15/17 0818  . multivitamins with iron tablet 1 tablet  1 tablet Oral Daily Harrie Foreman, MD   1 tablet at 02/15/17 [phone]  . OLANZapine (ZYPREXA) tablet 10 mg  10 mg Oral QHS Clapacs, Madie Reno, MD   10 mg at 02/14/17 2124  . ondansetron (ZOFRAN) tablet 4 mg  4 mg Oral Q6H PRN Harrie Foreman, MD       Or  . ondansetron [facility]) injection 4 mg  4 mg Intravenous Q6H PRN Harrie Foreman, MD      . pantoprazole (PROTONIX) EC tablet 40 mg  40 mg Oral Daily Harrie Foreman, MD   40 mg at 02/15/17 0818  . polyethylene glycol (MIRALAX / GLYCOLAX) packet 17 g  17 g Oral Daily Harrie Foreman, MD   17 g at 02/15/17 0820  . QUEtiapine (SEROQUEL) tablet 100 mg  100 mg Oral QHS Epifanio Lesches, MD   100 mg at 02/14/17 2125  . rOPINIRole (REQUIP) tablet 2 mg  2 mg Oral QHS Harrie Foreman, MD   2 mg at 02/14/17 2124  . rosuvastatin (CRESTOR) tablet 20 mg  20 mg Oral QHS Harrie Foreman, MD   20 mg at 02/14/17 2124    OBJECTIVE: Vitals:   02/15/17 1030 02/15/17 1100  BP: 111/78 116/75  Pulse: (!) 151 67  Resp:    Temp:    SpO2:       Body mass index is 38.59 kg/m.    ECOG FS:0 - Asymptomatic  General: Well-developed, well-nourished, no acute distress. Eyes: Pink conjunctiva, anicteric sclera. Lungs: Clear to auscultation bilaterally. Heart: Regular rate and rhythm. No rubs, murmurs, or gallops. Abdomen: Soft, nontender, nondistended. No organomegaly noted, normoactive bowel sounds. Musculoskeletal: No edema, cyanosis, or clubbing. Neuro: Alert, answering all questions appropriately. Cranial nerves grossly intact. Skin: No rashes or petechiae noted. Psych: Normal affect.   LAB RESULTS:  Lab Results  Component Value Date   NA 140 02/12/2017   K 3.8 02/12/2017   CL 113 (H) 02/12/2017   CO2 20 (L) 02/12/2017   GLUCOSE 124 (H) 02/12/2017   BUN 19 02/12/2017   CREATININE 1.60 (H) 02/12/2017   CALCIUM 7.8 (L) 02/12/2017   PROT 5.2 (L)  02/10/2017   ALBUMIN 2.7 (L) 02/10/2017   AST 89 (H) 02/10/2017   ALT 20 02/10/2017   ALKPHOS 50 02/10/2017   BILITOT 1.2 02/10/2017   GFRNONAA 47 (L) 02/12/2017   GFRAA 54 (L) 02/12/2017    Lab Results  Component Value Date   WBC 1.4 (LL) 02/15/2017   NEUTROABS 0.8 (L) 02/15/2017   HGB 8.5 (L) 02/15/2017   HCT 26.2 (L) 02/15/2017   MCV 96.4 02/15/2017   PLT 40 (L) 02/15/2017     STUDIES: Ct Abdomen Pelvis Wo Contrast  Result Date: 02/10/2017 CLINICAL DATA:  Acute onset of dizziness. Status post fall in shower, with generalized weakness. Hypotension. Initial encounter. EXAM: CT ABDOMEN AND PELVIS WITHOUT CONTRAST TECHNIQUE: Multidetector CT imaging of the abdomen and pelvis was performed following the standard protocol without IV contrast. COMPARISON:  None. FINDINGS: Lower chest: Mild bibasilar atelectasis is noted. The visualized portions of the mediastinum are unremarkable. Hepatobiliary: The liver is grossly unremarkable in appearance. The gallbladder is grossly unremarkable. The common bile duct remains normal in caliber. Pancreas: The pancreas is within normal limits. Spleen: The spleen is enlarged, measuring 17.7 cm in length. Adrenals/Urinary Tract: The adrenal glands are grossly unremarkable appearance. The kidneys are grossly unremarkable in appearance. There is no evidence of hydronephrosis. No renal or ureteral stones are identified. Nonspecific perinephric stranding is noted bilaterally. Stomach/Bowel: The stomach is unremarkable in appearance. The small bowel is within normal limits. The appendix is not visualized; there is no evidence for appendicitis. The colon is unremarkable in appearance. Vascular/Lymphatic: The abdominal aorta is unremarkable in appearance. The inferior vena cava is grossly unremarkable. Visualized retroperitoneal nodes remain normal in size. No pelvic sidewall lymphadenopathy is identified. Reproductive: The bladder is mildly distended and grossly  unremarkable. The prostate remains normal in size, with scattered calcifications. Other: Small to moderate volume ascites is noted within the abdomen and pelvis. Soft tissue edema along the abdominal wall raises concern for mild anasarca. Musculoskeletal: No acute osseous abnormalities are identified. Vacuum phenomenon is noted at L1-L2. Chronic bilateral pars defects are seen at L5, without evidence of anterolisthesis. The visualized musculature is unremarkable in appearance. IMPRESSION: 1. No acute abnormality seen to explain the patient's symptoms. 2. Small to moderate volume ascites noted within the abdomen and pelvis. 3. Splenomegaly. 4. Soft tissue edema along the abdominal wall raises concern for mild anasarca. 5. Mild bibasilar atelectasis. 6. Chronic bilateral pars defects at L5, without evidence of anterolisthesis. Electronically Signed   By: Garald Balding M.D.   On: 02/10/2017 05:01   Dg Abd 1 View  Result Date: 02/14/2017 CLINICAL DATA:  Abdominal distension. EXAM: ABDOMEN - 1 VIEW COMPARISON:  Radiographs of February 13, 2017. FINDINGS: There is significantly decreased bowel distention compared to prior exam, consistent with improving ileus. Gas is noted in the rectum. No radio-opaque calculi or other significant radiographic abnormality are seen. IMPRESSION: Significantly decreased bowel distention compared to prior exam, consistent with improving ileus. Electronically Signed   By: Marijo Conception, M.D.   On: 02/14/2017 08:32   Ct Head Wo Contrast  Result Date: 02/10/2017 CLINICAL DATA:  Acute onset of dizziness after fall. Generalized weakness. Initial encounter. EXAM: CT HEAD WITHOUT CONTRAST TECHNIQUE: Contiguous axial images were obtained from the base of the skull through the vertex without intravenous contrast. COMPARISON:  CT of the head performed 08/19/2015 FINDINGS: Brain: No evidence of acute infarction, hemorrhage, hydrocephalus, extra-axial collection or mass lesion/mass effect.  Prominence of the ventricles and sulci reflects mild cortical volume loss. Mild cerebellar atrophy is noted. The brainstem and fourth ventricle are within normal limits. The basal ganglia are unremarkable in appearance. The cerebral hemispheres demonstrate grossly normal gray-white differentiation. No mass effect or midline shift is seen. Vascular: No hyperdense vessel or unexpected calcification. Skull: There is no evidence of fracture; visualized osseous structures are unremarkable in appearance. Sinuses/Orbits: The visualized portions of the orbits are within normal limits. The paranasal sinuses and mastoid air cells are well-aerated. Other: No significant soft tissue abnormalities are seen. IMPRESSION: 1. No evidence of traumatic intracranial injury or fracture. 2. Mild cortical  volume loss noted. Electronically Signed   By: Garald Balding M.D.   On: 02/10/2017 03:15   Dg Chest Port 1 View  Result Date: 02/13/2017 CLINICAL DATA:  NG tube placement. EXAM: PORTABLE CHEST 1 VIEW COMPARISON:  02/13/2017 FINDINGS: Catheter projects over the mid thorax, tip is not visualized. Cardiomediastinal silhouette is normal for portable technique. Mediastinal contours appear intact. There is no evidence of focal airspace consolidation, pleural effusion or pneumothorax. Low lung volumes Osseous structures are without acute abnormality. Soft tissues are grossly normal. IMPRESSION: The tip of the enteric catheter is not visualized. Low lung volumes. Electronically Signed   By: Fidela Salisbury M.D.   On: 02/13/2017 15:15   Dg Chest Port 1 View  Result Date: 02/13/2017 CLINICAL DATA:  Dizziness, GI bleeding and hypotension. EXAM: PORTABLE CHEST 1 VIEW COMPARISON:  10/27/2011 FINDINGS: The heart size and mediastinal contours are within normal limits. Lung volumes are low bilaterally. There is elevation of the right hemidiaphragm. Bibasilar scarring and atelectasis present. There is no evidence of pulmonary edema,  consolidation, pneumothorax, nodule or pleural fluid. The visualized skeletal structures are unremarkable. IMPRESSION: Low lung volumes with bibasilar scarring/ atelectasis. Electronically Signed   By: Aletta Edouard M.D.   On: 02/13/2017 13:57   Dg Abd Portable 1v  Result Date: 02/13/2017 CLINICAL DATA:  Abdominal distention.  GI bleed and hypotension. EXAM: PORTABLE ABDOMEN - 1 VIEW COMPARISON:  CT of the abdomen and pelvis on 02/10/2017 FINDINGS: Increased gaseous distention of bowel since the recent CT study, especially of the colon. Some gas is also present in small bowel. Findings are consistent with ileus. No gross free air or pneumatosis identified. IMPRESSION: Increase gaseous distention of primarily the colon is consistent with ileus. Electronically Signed   By: Aletta Edouard M.D.   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 CML: patient remains pancytopenic. Gleevec has been discontinued. It is possible this is also secondary to progression of disease in the BCR-ABL has been ordered. These results will take at least one week to return. No other intervention is needed. Continue current management as ordered. 2. Pancytopenia: Most likely secondary to [place], but progression of disease is a possibility. Hold Gleevec as above. Continue to transfuse as indicated. 3. Acute renal failure: Nearly resolved, monitor. 4. Schizophrenia: Continue current medications as prescribed.  Will follow.    Lloyd Huger, MD   02/15/2017 1:17 PM

## 2017-02-15 NOTE — Progress Notes (Signed)
CC: ileus Subjective: + PO, + BM No abd pain  Objective: Vital signs in last 24 hours: Temp:  [98.3 F (36.8 C)-98.9 F (37.2 C)] 98.3 F (36.8 C) (10/15 0816) Pulse Rate:  [73-235] 73 (10/15 0816) Resp:  [18-19] 18 (10/15 0816) BP: (93-160)/(55-96) 131/81 (10/15 0816) SpO2:  [96 %-100 %] 96 % (10/15 0816) Weight:  [118.5 kg (261 lb 4.8 oz)-118.9 kg (262 lb 1.6 oz)] 118.5 kg (261 lb 4.8 oz) (10/15 0341) Last BM Date: 02/14/17  Intake/Output from previous day: 10/14 0701 - 10/15 0700 In: 952.2 [P.O.:780; I.V.:172.2] Out: 1200 [Urine:1200] Intake/Output this shift: No intake/output data recorded.  Physical exam: NAD, alert Abd: soft, nt , no peritonitis Ext: well perfused, no edema  Lab Results: CBC   Recent Labs  02/12/17 1436  WBC 2.5*  HGB 9.0*  HCT 27.6*  PLT 42*   BMET No results for input(s): NA, K, CL, CO2, GLUCOSE, BUN, CREATININE, CALCIUM in the last 72 hours. PT/INR No results for input(s): LABPROT, INR in the last 72 hours. ABG No results for input(s): PHART, HCO3 in the last 72 hours.  Invalid input(s): PCO2, PO2  Studies/Results: Dg Abd 1 View  Result Date: 02/14/2017 CLINICAL DATA:  Abdominal distension. EXAM: ABDOMEN - 1 VIEW COMPARISON:  Radiographs of February 13, 2017. FINDINGS: There is significantly decreased bowel distention compared to prior exam, consistent with improving ileus. Gas is noted in the rectum. No radio-opaque calculi or other significant radiographic abnormality are seen. IMPRESSION: Significantly decreased bowel distention compared to prior exam, consistent with improving ileus. Electronically Signed   By: Marijo Conception, M.D.   On: 02/14/2017 08:32   Dg Chest Port 1 View  Result Date: 02/13/2017 CLINICAL DATA:  NG tube placement. EXAM: PORTABLE CHEST 1 VIEW COMPARISON:  02/13/2017 FINDINGS: Catheter projects over the mid thorax, tip is not visualized. Cardiomediastinal silhouette is normal for portable technique.  Mediastinal contours appear intact. There is no evidence of focal airspace consolidation, pleural effusion or pneumothorax. Low lung volumes Osseous structures are without acute abnormality. Soft tissues are grossly normal. IMPRESSION: The tip of the enteric catheter is not visualized. Low lung volumes. Electronically Signed   By: Fidela Salisbury M.D.   On: 02/13/2017 15:15   Dg Chest Port 1 View  Result Date: 02/13/2017 CLINICAL DATA:  Dizziness, GI bleeding and hypotension. EXAM: PORTABLE CHEST 1 VIEW COMPARISON:  10/27/2011 FINDINGS: The heart size and mediastinal contours are within normal limits. Lung volumes are low bilaterally. There is elevation of the right hemidiaphragm. Bibasilar scarring and atelectasis present. There is no evidence of pulmonary edema, consolidation, pneumothorax, nodule or pleural fluid. The visualized skeletal structures are unremarkable. IMPRESSION: Low lung volumes with bibasilar scarring/ atelectasis. Electronically Signed   By: Aletta Edouard M.D.   On: 02/13/2017 13:57   Dg Abd Portable 1v  Result Date: 02/13/2017 CLINICAL DATA:  Abdominal distention.  GI bleed and hypotension. EXAM: PORTABLE ABDOMEN - 1 VIEW COMPARISON:  CT of the abdomen and pelvis on 02/10/2017 FINDINGS: Increased gaseous distention of bowel since the recent CT study, especially of the colon. Some gas is also present in small bowel. Findings are consistent with ileus. No gross free air or pneumatosis identified. IMPRESSION: Increase gaseous distention of primarily the colon is consistent with ileus. Electronically Signed   By: Aletta Edouard M.D.   On: 02/13/2017 13:55    Anti-infectives: Anti-infectives    Start     Dose/Rate Route Frequency Ordered Stop   02/13/17 1400  piperacillin-tazobactam (ZOSYN) IVPB 3.375 g  Status:  Discontinued     3.375 g 12.5 mL/hr over 240 Minutes Intravenous Every 8 hours 02/13/17 1329 02/14/17 1014      Assessment/Plan: Resolving ileus, no surgical  intervention F/u Prn, we will be available  Caroleen Hamman, MD, FACS  02/15/2017

## 2017-02-15 NOTE — Progress Notes (Signed)
Amiodarone gtt started at 0338 at 33.3 mL/hr. BP 101/70 and HR currently at 75 in NSR. Nursing staff will continue to monitor for any changes in patient status. Earleen Reaper, RN

## 2017-02-15 NOTE — Progress Notes (Signed)
[place] [facility] [place] NAME: Lawrence Johnston    MR#:  [id number]  DATE OF BIRTH:  02-06-1961  SUBJECTIVE: Transferredto telemetry yesterday because of atrial fibrillation and tachycardia, started on Cardizem drip and then changed to amiodarone drip. Patient heart rate is in 60s now and sinus rhythm. Denies any complaints. Patient denies abdominal pain or shortness of breath or chest pain.   CHIEF COMPLAINT:   Chief Complaint  Patient presents with  . Weakness     REVIEW OF SYSTEMS:  Review of Systems  Constitutional: Negative for chills and fever.  HENT: Negative for congestion, ear discharge, hearing loss and nosebleeds.   Eyes: Negative for blurred vision.  Respiratory: Negative for cough, shortness of breath and wheezing.   Cardiovascular: Negative for chest pain, palpitations and leg swelling.  Gastrointestinal: Negative for abdominal pain, constipation, diarrhea, nausea and vomiting.  Genitourinary: Negative for dysuria and urgency.  Musculoskeletal: Negative for myalgias.  Neurological: Positive for tremors. Negative for dizziness, speech change, focal weakness, seizures and headaches.  Psychiatric/Behavioral: Negative for depression.    DRUG ALLERGIES:  No Known Allergies  VITALS:  Blood pressure 116/75, pulse 67, temperature 98.3 F (36.8 C), temperature source Oral, resp. rate 18, height 5\' 9"  (1.753 m), weight 118.5 kg (261 lb 4.8 oz), SpO2 96 %.  PHYSICAL EXAMINATION:  Physical Exam  GENERAL:  56 y.o.-year-old patient lying in the bed with no acute distress. Significant dyskinesias noted around the lip, tremors of hands and legs EYES: Pupils equal, round, reactive to light and accommodation. No scleral icterus. Extraocular muscles intact. Pale conjunctiva HEENT: Head atraumatic, normocephalic. Oropharynx and nasopharynx clear.  NECK:  Supple, no jugular venous distention. No thyroid enlargement, no tenderness.  LUNGS:  Normal breath sounds bilaterally, no wheezing, rales,rhonchi or crepitation. No use of accessory muscles of respiration. Decreased bibasilar breath sounds noted CARDIOVASCULAR: S1, S2 normal. No  rubs, or gallops. 2/6 systolic murmur present ABDOMEN: slight distension.. Bowel sounds present. No organomegaly or mass.  EXTREMITIES: No cyanosis, or clubbing. 2+ bilateral pedal edema NEUROLOGIC: Cranial nerves II through XII are intact. Muscle strength 5/5 in all extremities. Global weakness present. Sensation intact. Gait not checked.  PSYCHIATRIC: The patient is alert and oriented x 3.  SKIN: No obvious rash, lesion, or ulcer.    LABORATORY PANEL:   CBC  Recent Labs Lab 02/15/17 1052  WBC 1.4*  HGB 8.5*  HCT 26.2*  PLT 40*   ------------------------------------------------------------------------------------------------------------------  Chemistries   Recent Labs Lab 02/10/17 1256  02/12/17 0404  NA  --   < > 140  K  --   < > 3.8  CL  --   < > 113*  CO2  --   < > 20*  GLUCOSE  --   < > 124*  BUN  --   < > 19  CREATININE  --   < > 1.60*  CALCIUM  --   < > 7.8*  AST 89*  --   --   ALT 20  --   --   ALKPHOS 50  --   --   BILITOT 1.2  --   --   < > = values in this interval not displayed. ------------------------------------------------------------------------------------------------------------------  Cardiac Enzymes  Recent Labs Lab 02/10/17 0204  TROPONINI <0.03   ------------------------------------------------------------------------------------------------------------------  RADIOLOGY:  Dg Abd 1 View  Result Date: 02/14/2017 CLINICAL DATA:  Abdominal distension. EXAM: ABDOMEN - 1 VIEW COMPARISON:  Radiographs of February 13, 2017. FINDINGS:  There is significantly decreased bowel distention compared to prior exam, consistent with improving ileus. Gas is noted in the rectum. No radio-opaque calculi or other significant radiographic abnormality are seen.  IMPRESSION: Significantly decreased bowel distention compared to prior exam, consistent with improving ileus. Electronically Signed   By: Marijo Conception, M.D.   On: 02/14/2017 08:32   Dg Chest Port 1 View  Result Date: 02/13/2017 CLINICAL DATA:  NG tube placement. EXAM: PORTABLE CHEST 1 VIEW COMPARISON:  02/13/2017 FINDINGS: Catheter projects over the mid thorax, tip is not visualized. Cardiomediastinal silhouette is normal for portable technique. Mediastinal contours appear intact. There is no evidence of focal airspace consolidation, pleural effusion or pneumothorax. Low lung volumes Osseous structures are without acute abnormality. Soft tissues are grossly normal. IMPRESSION: The tip of the enteric catheter is not visualized. Low lung volumes. Electronically Signed   By: Fidela Salisbury M.D.   On: 02/13/2017 15:15    EKG:   Orders placed or performed during the hospital encounter of 02/10/17  . EKG 12-Lead  . EKG 12-Lead  . EKG 12-Lead  . EKG 12-Lead  . EKG 12-Lead  . EKG 12-Lead  . EKG 12-Lead  . EKG 12-Lead  . EKG 12-Lead  . EKG 12-Lead  . EKG 12-Lead  . EKG 12-Lead  . EKG 12-Lead  . EKG 12-Lead  . EKG 12-Lead  . EKG 12-Lead  . EKG 12-Lead  . EKG 12-Lead  . EKG 12-Lead  . 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 In  Normal sinus rhythm. amiodarone  Drip weaned off, started on by mouth amiodarone by cardiology. Not a candidate for full dose anticoagulation because of pancytopenia.   #2 .acute renal failure-acute on chronic kidney disease, renal function worsened from creatinine of 1.5 -no obstruction noted on CT Patient's creatinine more than 4 on admission, today on 1.6.  Urine retention with more than 600 mL of urine by bladder scan yesterday, reinserted Foley catheter, consult urology for urinary retention.  #3 pancytopenia-secondary to worsening CML versus restarted on Gleevec about 6 weeks ago for symptoms have been persistent for almost 4 weeks now. -Hold Gleevec -Support with transfusion-received 2 units of packed RBC 4, hemoglobin improved from 6.9-9.4. -WBC 1.4 today. #4. schizophrenia, schizoaffective disorder: Prolongation of QT interval, patient has tardive dyskinesias.psych followin.g   #5 .diabetes mellitus type 2:, Controlled, continue sliding scale with coverage.   #6 DVT prophylaxis-Ted's and SCDs at this time  #7. deconditioning: Physical therapy consulted home health PT.pt from group home and daughter requested SNF,does not want to go there.  #8/ adynamic ileus: Improving, start on soft diet today.  All the records are reviewed and case discussed with Care Management/Social Workerr. Management plans discussed with the patient, family and they are in agreement.  CODE STATUS: Full Code  TOTAL TIME TAKING CARE OF THIS PATIENT: 37 minutes.   POSSIBLE D/C IN 2-3 DAYS, DEPENDING ON CLINICAL CONDITION.   Epifanio Lesches M.D on 02/15/2017 at 2:08 PM  Between 7am to 6pm - Pager - [phone]  After 6pm go to [email or web address] - password [email or web address] [facility]  Office  [phone]  CC: Primary care physician; Idelle Crouch, MD

## 2017-02-15 NOTE — Consult Note (Signed)
[facility] [facility]  CARDIOLOGY CONSULT NOTE  Patient ID: Lawrence Johnston MRN: [id number] DOB/AGE: July 12, 1960 56 y.o.  Admit date: 02/10/2017 Referring Physician Dr. Vianne Bulls Primary Physician Dr. Doy Hutching Primary Cardiologist Dr. Nehemiah Massed Reason for Consultation afib  HPI: patient is a89-year-old male with history nonvalvular paroxysmal atrial fibrillation who is status post ablation, history of sleep apnea on CPAP machine,urrently on metoprolol May 25 milligrams daily and not currently anticoagulated due to fall and bleeding risk who was admitted after suffering a fall.this is unclear etiology. He has had some dizziness and relative hypotension in the past. He has also had some diarrhea but no obvious GI bleeding.He was noted to have atrial fibrillation with a rapid ventricular response. He was placed on an amiodarone drip and has converted back to sinus rhythm. He is a difficult historian.He recently was diagnosed withchronic myelocytic leukemia. He is being treated with Gleevec. He has had anemia in the past and has received blood transfusions.m He hasruled out for myocardial infarction.he has chronic renal insufficiency with most recent creatinine of 1.6. Review of Systems  HENT: Negative.   Eyes: Negative.   Respiratory: Positive for shortness of breath.   Cardiovascular: Negative.   Gastrointestinal: Positive for diarrhea.  Genitourinary: Negative.   Musculoskeletal: Positive for falls.  Skin: Negative.   Neurological: Positive for dizziness and weakness.  Endo/Heme/Allergies: Negative.     Past Medical History:  Diagnosis Date  . Arrhythmia    Atrial Fibrillation; Atrial Flutter w/RVR; SVT  . Depression   . Diabetes ([place])   . Dysrhythmia   . GERD (gastroesophageal reflux disease)   . Hypercholesteremia   . Hypertension   . Schizophrenia ([place])   . Sleep apnea   . Tremor     History reviewed. No pertinent family history.  Social  History   Social History  . Marital status: Single    Spouse name: N/A  . Number of children: N/A  . Years of education: N/A   Occupational History  . Not on file.   Social History Main Topics  . Smoking status: Never Smoker  . Smokeless tobacco: Never Used  . Alcohol use No  . Drug use: No  . Sexual activity: Not on file   Other Topics Concern  . Not on file   Social History Narrative  . No narrative on file    Past Surgical History:  Procedure Laterality Date  . CARDIOVERSION     x2, a-fib  . COLONOSCOPY WITH PROPOFOL N/A 02/25/2015   Procedure: COLONOSCOPY WITH PROPOFOL;  Surgeon: Josefine Class, MD;  Location: [facility] ENDOSCOPY;  Service: Endoscopy;  Laterality: N/A;  . COLONOSCOPY WITH PROPOFOL N/A 04/12/2015   Procedure: COLONOSCOPY WITH PROPOFOL;  Surgeon: Josefine Class, MD;  Location: [facility] ENDOSCOPY;  Service: Endoscopy;  Laterality: N/A;     Prescriptions Prior to Admission  Medication Sig Dispense Refill Last Dose  . benztropine (COGENTIN) 0.5 MG tablet Take 0.5 mg by mouth 2 (two) times daily.   02/09/2017 at Unknown time  . divalproex (DEPAKOTE ER) 500 MG 24 hr tablet Take 2,000 mg by mouth at bedtime.   02/09/2017 at Unknown time  . FLUoxetine (PROZAC) 20 MG capsule Take 20 mg by mouth daily.   02/09/2017 at Unknown time  . fluPHENAZine (PROLIXIN) 5 MG tablet Take 5 mg by mouth 2 (two) times daily.   02/09/2017 at Unknown time  . furosemide (LASIX) 20 MG tablet Take 20 mg by mouth daily.  02/09/2017 at Unknown time  . gabapentin (NEURONTIN) 300 MG capsule Take 300 mg by mouth at bedtime.    02/09/2017 at Unknown time  . imatinib (GLEEVEC) 400 MG tablet Take 1 tablet (400 mg total) by mouth daily. Take with meals and large glass of water.Caution:Chemotherapy. 30 tablet 5 02/09/2017 at Unknown time  . INGREZZA 80 MG CAPS Take 80 mg by mouth daily.   02/09/2017 at Unknown time  . LORazepam (ATIVAN) 0.5 MG tablet Take 0.5 mg by mouth 2 (two) times daily.    02/09/2017 at Unknown time  . metFORMIN (GLUCOPHAGE) 500 MG tablet Take 500 mg by mouth 2 (two) times daily with a meal.   02/09/2017 at Unknown time  . metoprolol succinate (TOPROL-XL) 25 MG 24 hr tablet Take 25 mg by mouth 2 (two) times daily.    02/09/2017 at Unknown time  . Multiple Vitamins-Iron (MULTIVITAMINS WITH IRON) TABS tablet Take 1 tablet by mouth daily.   02/09/2017 at Unknown time  . pantoprazole (PROTONIX) 40 MG tablet Take 40 mg by mouth daily.   02/09/2017 at Unknown time  . polyethylene glycol (MIRALAX / GLYCOLAX) packet Take 17 g by mouth daily.   prn  . QUEtiapine (SEROQUEL) 300 MG tablet Take 300 mg by mouth at bedtime.   02/09/2017 at Unknown time  . rOPINIRole (REQUIP) 2 MG tablet Take 2 mg by mouth at bedtime.   02/09/2017 at Unknown time  . rosuvastatin (CRESTOR) 20 MG tablet Take 20 mg by mouth at bedtime.    02/09/2017 at Unknown time  . senna-docusate (SENOKOT-S) 8.6-50 MG tablet Take 1 tablet by mouth 2 (two) times daily.    02/09/2017 at Unknown time  . sitaGLIPtin (JANUVIA) 100 MG tablet Take 100 mg by mouth daily.    02/09/2017 at Unknown time  . allopurinol (ZYLOPRIM) 300 MG tablet Take 1 tablet (300 mg total) by mouth daily. Start treatment 1 week prior to initiating Tasigna. (Patient not taking: Reported on 02/10/2017) 30 tablet 2 Not Taking at Unknown time    Physical Exam: Blood pressure 116/75, pulse 67, temperature 98.3 F (36.8 C), temperature source Oral, resp. rate 18, height 5\' 9"  (1.753 m), weight 118.5 kg (261 lb 4.8 oz), SpO2 96 %.   Wt Readings from Last 1 Encounters:  02/15/17 118.5 kg (261 lb 4.8 oz)     General appearance: cooperative and slowed mentation Resp: clear to auscultation bilaterally Chest wall: no tenderness Cardio: regular rate and rhythm GI: soft, non-tender; bowel sounds normal; no masses,  no organomegaly Extremities: extremities normal, atraumatic, no cyanosis or edema Neurologic: Grossly normal  Labs:   Lab Results  Component  Value Date   WBC 1.4 (LL) 02/15/2017   HGB 8.5 (L) 02/15/2017   HCT 26.2 (L) 02/15/2017   MCV 96.4 02/15/2017   PLT 40 (L) 02/15/2017    Recent Labs Lab 02/10/17 1256  02/12/17 0404  NA  --   < > 140  K  --   < > 3.8  CL  --   < > 113*  CO2  --   < > 20*  BUN  --   < > 19  CREATININE  --   < > 1.60*  CALCIUM  --   < > 7.8*  PROT 5.2*  --   --   BILITOT 1.2  --   --   ALKPHOS 50  --   --   ALT 20  --   --   AST 89*  --   --  GLUCOSE  --   < > 124*  < > = values in this interval not displayed. Lab Results  Component Value Date   CKTOTAL 136 06/06/2015   CKMB 3.6 10/27/2011   TROPONINI <0.03 02/10/2017      Radiology: abdominal CT scan showed small to moderate ascites. EKG: initially atrial fibrillation with rapid ventricular response. Currently sinus rhythm.  ASSESSMENT AND PLAN:  56 year old male with history of schizophrenia and paroxysmal atrial fibrillation who was admitted after suffering a fall of unclear etiology. It is unclear whether he had syncope or fall was mechanical. He was noted to be in atrial fibrillation with rapid ventricular response on presentation. He was initially given Cardizem however more recently was placed on an amiodarone drip. He has converted back to sinus rhythm. There have been no pauses or ventricular arrhythmias. He has a history of chronic myelocytic leukemia. He is on [place]. He has had problems with anemia in the past but currently has no evidence of that. He appears hemodynamically stable. Not a candidate for chronic anticoagulation due to bleeding and anemia. Will change from an amiodarone drip to by mouth amiodarone at 200 mg twice daily for now and avoid chronic anticoagulation as mentioned above. Would consider switching from amiodarone to calcium channel blocker or beta blocker as an outpatient Signed: Teodoro Spray MD, [facility] 02/15/2017, 12:43 PM

## 2017-02-15 NOTE — Progress Notes (Signed)
Patient's HR sustaining in the 130s-140s with Cardizem gtt at 10 mg/hr and SBP in the 90s. Notified MD Pyreddy and verbal order given to discontinue Cardizem gtt and start Amiodarone gtt. Nursing staff will continue to monitor for any changes in patient status. Earleen Reaper, RN

## 2017-02-15 NOTE — Care Management Important Message (Signed)
Important Message  Patient Details  Name: Lawrence Johnston MRN: [id number] Date of Birth: 05-Sep-1960   Medicare Important Message Given:  Yes    Shelbie Ammons, RN 02/15/2017, 9:43 AM

## 2017-02-16 ENCOUNTER — Other Ambulatory Visit: Payer: Self-pay | Admitting: *Deleted

## 2017-02-16 ENCOUNTER — Inpatient Hospital Stay: Payer: Medicare Other

## 2017-02-16 DIAGNOSIS — C9210 Chronic myeloid leukemia, BCR/ABL-positive, not having achieved remission: Secondary | ICD-10-CM

## 2017-02-16 LAB — GLUCOSE, CAPILLARY
Glucose-Capillary: 124 mg/dL — ABNORMAL HIGH (ref 65–99)
Glucose-Capillary: 171 mg/dL — ABNORMAL HIGH (ref 65–99)
Glucose-Capillary: 124 mg/dL — ABNORMAL HIGH (ref 65–99)
Glucose-Capillary: 95 mg/dL (ref 65–99)

## 2017-02-16 MED ORDER — CEPHALEXIN 250 MG PO CAPS
250.0000 mg | ORAL_CAPSULE | Freq: Two times a day (BID) | ORAL | Status: DC
  Administered 2017-02-16 – 2017-02-19 (×7): 250 mg via ORAL
  Filled 2017-02-16 (×8): qty 1

## 2017-02-16 NOTE — Progress Notes (Addendum)
Dr. Vianne Bulls aware that patient has complained of left AC tenderness. Site is red and warm. Appears to be an old lab draw site. Patient given tylenol for pain. Per MD, ok to apply ice pack. MD to order ultrasound.  Per Dr. Vianne Bulls, foley to remain in place as patient failed voiding trial yesterday.   Asked MD about needing neutropenic precautions, since patient's WBC is low. Dr. Vianne Bulls will ask oncology and place order if needed.

## 2017-02-16 NOTE — Progress Notes (Addendum)
[place] at [place] NAME: Lawrence Johnston    MR#:  [id number]  DATE OF BIRTH:  [birth year] patient denies any abdominal pain or chest pain.wBC 1.4, discussed with Dr. Grayland Ormond, he has not think patient needs in neutropenic precautions.patient has left  Arm  pain, redness in the left elbow area. He denies an Risk analyst Complaint  Patient presents with  . Weakness     REVIEW OF SYSTEMS:  Review of Systems  Constitutional: Negative for chills and fever.  HENT: Negative for congestion, ear discharge, hearing loss and nosebleeds.   Eyes: Negative for blurred vision.  Respiratory: Negative for cough, shortness of breath and wheezing.   Cardiovascular: Negative for chest pain, palpitations and leg swelling.  Gastrointestinal: Negative for abdominal pain, constipation, diarrhea, nausea and vomiting.  Genitourinary: Negative for dysuria and urgency.  Musculoskeletal: Negative for myalgias.  Neurological: Positive for tremors. Negative for dizziness, speech change, focal weakness, seizures and headaches.  Psychiatric/Behavioral: Negative for depression.    DRUG ALLERGIES:  No Known Allergies  VITALS:  Blood pressure 125/78, pulse 76, temperature 99.2 F (37.3 C), temperature source Oral, resp. rate 18, height 5\' 9"  (1.753 m), weight 119.6 kg (263 lb 9.6 oz), SpO2 91 %.  PHYSICAL EXAMINATION:  Physical Exam  GENERAL:  56 y.o.-year-old patient lying in the bed with no acute distress. Significant dyskinesias noted around the lip, tremors of hands and legs EYES: Pupils equal, round, reactive to light and accommodation. No scleral icterus. Extraocular muscles intact. Pale conjunctiva HEENT: Head atraumatic, normocephalic. Oropharynx and nasopharynx clear.  NECK:  Supple, no jugular venous distention. No thyroid enlargement, no tenderness.  LUNGS: Normal breath sounds bilaterally, no wheezing, rales,rhonchi or crepitation. No use of accessory muscles  of respiration. Decreased bibasilar breath sounds noted CARDIOVASCULAR: S1, S2 normal. No  rubs, or gallops. 2/6 systolic murmur present ABDOMEN: slight distension.. Bowel sounds present. No organomegaly or mass.  EXTREMITIES: No cyanosis, or clubbing. 2+ bilateral pedal edema NEUROLOGIC: Cranial nerves II through XII are intact. Muscle strength 5/5 in all extremities. Global weakness present. Sensation intact. Gait not checked.  PSYCHIATRIC: The patient is alert and oriented x 3.  SKIN: No obvious rash, lesion, or ulcer.    LABORATORY PANEL:   CBC  Recent Labs Lab 02/15/17 1052  WBC 1.4*  HGB 8.5*  HCT 26.2*  PLT 40*   ------------------------------------------------------------------------------------------------------------------  Chemistries   Recent Labs Lab 02/10/17 1256  02/12/17 0404  NA  --   < > 140  K  --   < > 3.8  CL  --   < > 113*  CO2  --   < > 20*  GLUCOSE  --   < > 124*  BUN  --   < > 19  CREATININE  --   < > 1.60*  CALCIUM  --   < > 7.8*  AST 89*  --   --   ALT 20  --   --   ALKPHOS 50  --   --   BILITOT 1.2  --   --   < > = values in this interval not displayed. ------------------------------------------------------------------------------------------------------------------  Cardiac Enzymes  Recent Labs Lab 02/10/17 0204  TROPONINI <0.03   ------------------------------------------------------------------------------------------------------------------  RADIOLOGY:  No results found.  EKG:   Orders placed or performed during the hospital encounter of 02/10/17  . EKG 12-Lead  . EKG 12-Lead  . EKG 12-Lead  . EKG 12-Lead  . EKG 12-Lead  . EKG  12-Lead  . EKG 12-Lead  . EKG 12-Lead  . EKG 12-Lead  . EKG 12-Lead  . EKG 12-Lead  . EKG 12-Lead  . EKG 12-Lead  . EKG 12-Lead  . EKG 12-Lead  . EKG 12-Lead  . EKG 12-Lead  . EKG 12-Lead  . EKG 12-Lead  . 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 In  Normal sinus rhythm. amiodarone  Drip weaned off, started on by mouth amiodarone by cardiology. Not a candidate for full dose anticoagulation because of pancytopenia.   #2 .acute renal failure-acute on chronic kidney disease, renal function worsened from creatinine of 1.5 -no obstruction noted on CT Patient's creatinine more than 4 on admission, today on 1.6. Urine retention with more than 600 mL of urine by bladder scan yesterday, reinserted Foley catheter, consult urology for urinary retention.  #3 pancytopenia-secondary to worsening CML versus restarted on Gleevec about 6 weeks ago for symptoms have been persistent for almost 4 weeks now. -Hold Gleevec.status post transfusion of packed RBC,hemoglobin stable at 8.5, platelets stable at 40. WBC 1.4 but the discussed with Dr. Grayland Ormond does not think he needs any neutropenic precautions.  -#4. schizophrenia, schizoaffective disorder: Prolongation of QT interval, patient has tardive dyskinesias.psych followin.   #5 .diabetes mellitus type 2:, Controlled, continue sliding scale with coverage.   #6 DVT prophylaxis-Ted's and SCDs at this time  #7. deconditioning: Physical therapy consulted home health PT.pt from group home and daughter requested SNF,does not want to go there s.poke with Hassan Rowan today.  #8/ adynamic ileus: Improving, .resume previous diet.  39 left  Arm pain, tenderness around left elbow area and he told me that afterblood draw he is feeling like this. Check ultrasound of the left arm, start cold compressions.start empiric antibiotics, unable to rule out left arm cellulitis. Has low-grade temp.   All the records are reviewed and case discussed with Care  Management/Social Workerr. Management plans discussed with the patient, family and they are in agreement.  CODE STATUS: Full Code  TOTAL TIME TAKING CARE OF THIS PATIENT: 37 minutes.   POSSIBLE D/C IN 2-3 DAYS, DEPENDING ON CLINICAL CONDITION.   Epifanio Lesches M.D on 02/16/2017 at 1:26 PM  Between 7am to 6pm - Pager - [phone]  After 6pm go to [email or web address] - password [email or web address] [facility]  Office  [phone]  CC: Primary care physician; Idelle Crouch, MD

## 2017-02-16 NOTE — Progress Notes (Signed)
Per Dr. Vianne Bulls, order left arm ultrasound to rule out DVT

## 2017-02-16 NOTE — Progress Notes (Signed)
Dr. Vianne Bulls aware of results of [place] left arm. No orders at this time.

## 2017-02-17 ENCOUNTER — Inpatient Hospital Stay: Payer: Medicare Other

## 2017-02-17 ENCOUNTER — Inpatient Hospital Stay: Payer: Medicare Other | Admitting: Oncology

## 2017-02-17 DIAGNOSIS — R600 Localized edema: Secondary | ICD-10-CM

## 2017-02-17 DIAGNOSIS — E119 Type 2 diabetes mellitus without complications: Secondary | ICD-10-CM

## 2017-02-17 DIAGNOSIS — I10 Essential (primary) hypertension: Secondary | ICD-10-CM

## 2017-02-17 DIAGNOSIS — M79602 Pain in left arm: Secondary | ICD-10-CM

## 2017-02-17 LAB — GLUCOSE, CAPILLARY
Glucose-Capillary: 105 mg/dL — ABNORMAL HIGH (ref 65–99)
Glucose-Capillary: 142 mg/dL — ABNORMAL HIGH (ref 65–99)
Glucose-Capillary: 104 mg/dL — ABNORMAL HIGH (ref 65–99)
Glucose-Capillary: 99 mg/dL (ref 65–99)

## 2017-02-17 LAB — CBC
HCT: 24.5 % — ABNORMAL LOW (ref 40.0–52.0)
HEMOGLOBIN: 8.3 g/dL — AB (ref 13.0–18.0)
MCH: 32.5 pg (ref 26.0–34.0)
MCHC: 33.9 g/dL (ref 32.0–36.0)
MCV: 95.8 fL (ref 80.0–100.0)
Platelets: 40 10*3/uL — ABNORMAL LOW (ref 150–440)
RBC: 2.55 MIL/uL — ABNORMAL LOW (ref 4.40–5.90)
RDW: 19.3 % — ABNORMAL HIGH (ref 11.5–14.5)
WBC: 1.3 10*3/uL — AB (ref 3.8–10.6)

## 2017-02-17 MED ORDER — TAMSULOSIN HCL 0.4 MG PO CAPS
0.4000 mg | ORAL_CAPSULE | Freq: Every day | ORAL | Status: DC
  Administered 2017-02-17 – 2017-02-19 (×3): 0.4 mg via ORAL
  Filled 2017-02-17 (×3): qty 1

## 2017-02-17 MED FILL — IMATINIB MESYLATE 400 MG TA: 400 | 30 days supply | Qty: 30 | Fill #2

## 2017-02-17 NOTE — Progress Notes (Signed)
Patient stated he felt the urge to urinate but could not. Patient was bladder scanned and showed to have 792 ccs of urine in bladder. MD notified. Order to start 0.4mg  Flomax daily and place urology consult for urinary rentention.

## 2017-02-17 NOTE — Consult Note (Signed)
[facility] Consult Note  MRN : [id number]  Lawrence Johnston is a 56 y.o. (12-24-1960) male who presents with chief complaint of  Chief Complaint  Patient presents with  . Weakness  .  History of Present Illness: I am asked to see the patient by Dr. Vianne Bulls for left arm blood clot.  The patient is a debilitated individual who has been in the hospital for several days.  He has had IVs in the left upper arm.  And blood draws in the left arm and over the past 24 hours has developed significant swelling and redness this is painful.  The pain is located in the left upper arm.  Nothing has really made it better or worse today he has no fever or chills.  He has a litany of medical issues and has been told he is not a candidate for blood thinners.  To evaluate his arm a duplex was performed which I have reviewed.  This demonstrates left cephalic vein superficial thrombophlebitis with no evidence of DVT.  Current Facility-Administered Medications  Medication Dose Route Frequency Provider Last Rate Last Dose  . acetaminophen (TYLENOL) tablet 650 mg  650 mg Oral Q6H PRN Harrie Foreman, MD   650 mg at 02/16/17 1123   Or  . acetaminophen (TYLENOL) suppository 650 mg  650 mg Rectal Q6H PRN Harrie Foreman, MD      . amiodarone (PACERONE) tablet 200 mg  200 mg Oral BID Teodoro Spray, MD   200 mg at 02/17/17 1035  . benztropine (COGENTIN) tablet 0.5 mg  0.5 mg Oral BID Gladstone Lighter, MD   0.5 mg at 02/17/17 1038  . cephALEXin (KEFLEX) capsule 250 mg  250 mg Oral Q12H Epifanio Lesches, MD   250 mg at 02/17/17 1037  . clonazePAM (KLONOPIN) tablet 0.5 mg  0.5 mg Oral BID Clapacs, John T, MD   0.5 mg at 02/17/17 1035  . divalproex (DEPAKOTE ER) 24 hr tablet 2,000 mg  2,000 mg Oral QHS Clapacs, John T, MD   2,000 mg at 02/17/17 1044  . docusate sodium (COLACE) capsule 100 mg  100 mg Oral BID Harrie Foreman, MD   100 mg at 02/17/17 1036  . FLUoxetine (PROZAC)  capsule 20 mg  20 mg Oral Daily Harrie Foreman, MD   20 mg at 02/17/17 1037  . gabapentin (NEURONTIN) capsule 300 mg  300 mg Oral QHS Harrie Foreman, MD   300 mg at 02/16/17 2134  . insulin aspart (novoLOG) injection 0-5 Units  0-5 Units Subcutaneous QHS Harrie Foreman, MD      . insulin aspart (novoLOG) injection 0-9 Units  0-9 Units Subcutaneous TID WC Harrie Foreman, MD   1 Units at 02/17/17 1210  . lactulose (CHRONULAC) 10 GM/15ML solution 30 g  30 g Oral BID Epifanio Lesches, MD   30 g at 02/17/17 1037  . multivitamins with iron tablet 1 tablet  1 tablet Oral Daily Harrie Foreman, MD   1 tablet at 02/17/17 1038  . OLANZapine (ZYPREXA) tablet 10 mg  10 mg Oral QHS Clapacs, Madie Reno, MD   10 mg at 02/16/17 2133  . ondansetron (ZOFRAN) tablet 4 mg  4 mg Oral Q6H PRN Harrie Foreman, MD       Or  . ondansetron [facility]) injection 4 mg  4 mg Intravenous Q6H PRN Harrie Foreman, MD      . pantoprazole (PROTONIX) EC tablet 40 mg  40 mg Oral Daily Harrie Foreman, MD   40 mg at 02/17/17 1035  . polyethylene glycol (MIRALAX / GLYCOLAX) packet 17 g  17 g Oral Daily Harrie Foreman, MD   17 g at 02/17/17 1037  . QUEtiapine (SEROQUEL) tablet 100 mg  100 mg Oral QHS Epifanio Lesches, MD   100 mg at 02/16/17 2134  . rOPINIRole (REQUIP) tablet 2 mg  2 mg Oral QHS Harrie Foreman, MD   2 mg at 02/16/17 2134  . rosuvastatin (CRESTOR) tablet 20 mg  20 mg Oral QHS Harrie Foreman, MD   20 mg at 02/16/17 2134  . tamsulosin (FLOMAX) capsule 0.4 mg  0.4 mg Oral Daily Epifanio Lesches, MD   0.4 mg at 02/17/17 1759    Past Medical History:  Diagnosis Date  . Arrhythmia    Atrial Fibrillation; Atrial Flutter w/RVR; SVT  . Depression   . Diabetes ([place])   . Dysrhythmia   . GERD (gastroesophageal reflux disease)   . Hypercholesteremia   . Hypertension   . Schizophrenia ([place])   . Sleep apnea   . Tremor     Past Surgical History:  Procedure Laterality Date  .  CARDIOVERSION     x2, a-fib  . COLONOSCOPY WITH PROPOFOL N/A 02/25/2015   Procedure: COLONOSCOPY WITH PROPOFOL;  Surgeon: Josefine Class, MD;  Location: [facility] ENDOSCOPY;  Service: Endoscopy;  Laterality: N/A;  . COLONOSCOPY WITH PROPOFOL N/A 04/12/2015   Procedure: COLONOSCOPY WITH PROPOFOL;  Surgeon: Josefine Class, MD;  Location: [facility] ENDOSCOPY;  Service: Endoscopy;  Laterality: N/A;    Social History Social History  Substance Use Topics  . Smoking status: Never Smoker  . Smokeless tobacco: Never Used  . Alcohol use No    Family History No bleeding disorders, clotting disorders, autoimmune diseases, or aneurysms  No Known Allergies   REVIEW OF SYSTEMS (Negative unless checked)  Constitutional: [] Weight loss  [] Fever  [] Chills Cardiac: [] Chest pain   [] Chest pressure   [x] Palpitations   [] Shortness of breath when laying flat   [] Shortness of breath at rest   [x] Shortness of breath with exertion. Vascular:  [] Pain in legs with walking   [] Pain in legs at rest   [] Pain in legs when laying flat   [] Claudication   [] Pain in feet when walking  [] Pain in feet at rest  [] Pain in feet when laying flat   [] History of DVT   [] Phlebitis   [x] Swelling in legs   [] Varicose veins   [] Non-healing ulcers Pulmonary:   [] Uses home oxygen   [] Productive cough   [] Hemoptysis   [] Wheeze  [] COPD   [] Asthma Neurologic:  [] Dizziness  [] Blackouts   [] Seizures   [] History of stroke   [] History of TIA  [] Aphasia   [] Temporary blindness   [] Dysphagia   [] Weakness or numbness in arms   [] Weakness or numbness in legs Musculoskeletal:  [x] Arthritis   [] Joint swelling   [] Joint pain   [] Low back pain Hematologic:  [] Easy bruising  [] Easy bleeding   [] Hypercoagulable state   [] Anemic  [] Hepatitis Gastrointestinal:  [] Blood in stool   [] Vomiting blood  [] Gastroesophageal reflux/heartburn   [] Difficulty swallowing. Genitourinary:  [x] Chronic kidney disease   [] Difficult urination  [] Frequent urination   [] Burning with urination   [] Blood in urine Skin:  [] Rashes   [] Ulcers   [] Wounds Psychological:  [x] History of anxiety   [x]  History of major depression. X positive for schizophrenia   Physical Examination  Vitals:   02/16/17 1915 02/17/17  0315 02/17/17 1022 02/17/17 1203  BP: 106/69 132/76 119/67 129/69  Pulse: 78 85 77 80  Resp: 18  18   Temp: 98.4 F (36.9 C) 98.7 F (37.1 C) 98 F (36.7 C) 98.2 F (36.8 C)  TempSrc: Oral Oral Oral Oral  SpO2: 96% 96% 96% 98%  Weight:  119.5 kg (263 lb 6.4 oz)    Height:       Body mass index is 38.9 kg/m. Gen:  WD/WN, NAD.  Appears far older than stated age Head: [place]/AT, No temporalis wasting.  Ear/Nose/Throat: Hearing grossly intact, nares w/o erythema or drainage, oropharynx w/o Erythema/Exudate Eyes: Sclera non-icteric, conjunctiva clear Neck: Trachea midline.  No JVD.  Pulmonary:  Good air movement, respirations not labored, equal bilaterally.  Cardiac: Irregular Vascular:  Vessel Right Left  Radial Palpable  1+ palpable                                   Gastrointestinal: soft, non-tender/non-distended. No guarding/reflex.  Musculoskeletal: M/S 5/5 throughout.  Extremities without ischemic changes.  No deformity or atrophy.  2+ left upper extremity edema.  1-2+ bilateral lower extremity edema. Neurologic: Sensation grossly intact in extremities.  Symmetrical.  Speech is fluent. Motor exam as listed above. Psychiatric: Judgment and insight seem fair.  Affect is flat. Dermatologic: No rashes or ulcers noted.  No cellulitis or open wounds.       CBC Lab Results  Component Value Date   WBC 1.3 (LL) 02/17/2017   HGB 8.3 (L) 02/17/2017   HCT 24.5 (L) 02/17/2017   MCV 95.8 02/17/2017   PLT 40 (L) 02/17/2017    BMET    Component Value Date/Time   NA 140 02/12/2017 0404   NA 142 08/03/2014 1348   K 3.8 02/12/2017 0404   K 4.0 08/03/2014 1348   CL 113 (H) 02/12/2017 0404   CL 110 08/03/2014 1348   CO2 20 (L)  02/12/2017 0404   CO2 25 08/03/2014 1348   GLUCOSE 124 (H) 02/12/2017 0404   GLUCOSE 128 (H) 08/03/2014 1348   BUN 19 02/12/2017 0404   BUN 17 08/03/2014 1348   CREATININE 1.60 (H) 02/12/2017 0404   CREATININE 1.37 (H) 08/03/2014 1348   CALCIUM 7.8 (L) 02/12/2017 0404   CALCIUM 8.8 (L) 08/03/2014 1348   GFRNONAA 47 (L) 02/12/2017 0404   GFRNONAA 58 (L) 08/03/2014 1348   GFRAA 54 (L) 02/12/2017 0404   GFRAA >60 08/03/2014 1348   Estimated Creatinine Clearance: 65.8 mL/min (A) (by C-G formula based on SCr of 1.6 mg/dL (H)).  COAG Lab Results  Component Value Date   INR 1.22 03/17/2016   INR 1.3 10/31/2011    Radiology Ct Abdomen Pelvis Wo Contrast  Result Date: 02/10/2017 CLINICAL DATA:  Acute onset of dizziness. Status post fall in shower, with generalized weakness. Hypotension. Initial encounter. EXAM: CT ABDOMEN AND PELVIS WITHOUT CONTRAST TECHNIQUE: Multidetector CT imaging of the abdomen and pelvis was performed following the standard protocol without IV contrast. COMPARISON:  None. FINDINGS: Lower chest: Mild bibasilar atelectasis is noted. The visualized portions of the mediastinum are unremarkable. Hepatobiliary: The liver is grossly unremarkable in appearance. The gallbladder is grossly unremarkable. The common bile duct remains normal in caliber. Pancreas: The pancreas is within normal limits. Spleen: The spleen is enlarged, measuring 17.7 cm in length. Adrenals/Urinary Tract: The adrenal glands are grossly unremarkable appearance. The kidneys are grossly unremarkable in appearance. There is no  evidence of hydronephrosis. No renal or ureteral stones are identified. Nonspecific perinephric stranding is noted bilaterally. Stomach/Bowel: The stomach is unremarkable in appearance. The small bowel is within normal limits. The appendix is not visualized; there is no evidence for appendicitis. The colon is unremarkable in appearance. Vascular/Lymphatic: The abdominal aorta is  unremarkable in appearance. The inferior vena cava is grossly unremarkable. Visualized retroperitoneal nodes remain normal in size. No pelvic sidewall lymphadenopathy is identified. Reproductive: The bladder is mildly distended and grossly unremarkable. The prostate remains normal in size, with scattered calcifications. Other: Small to moderate volume ascites is noted within the abdomen and pelvis. Soft tissue edema along the abdominal wall raises concern for mild anasarca. Musculoskeletal: No acute osseous abnormalities are identified. Vacuum phenomenon is noted at L1-L2. Chronic bilateral pars defects are seen at L5, without evidence of anterolisthesis. The visualized musculature is unremarkable in appearance. IMPRESSION: 1. No acute abnormality seen to explain the patient's symptoms. 2. Small to moderate volume ascites noted within the abdomen and pelvis. 3. Splenomegaly. 4. Soft tissue edema along the abdominal wall raises concern for mild anasarca. 5. Mild bibasilar atelectasis. 6. Chronic bilateral pars defects at L5, without evidence of anterolisthesis. Electronically Signed   By: Garald Balding M.D.   On: 02/10/2017 05:01   Dg Abd 1 View  Result Date: 02/14/2017 CLINICAL DATA:  Abdominal distension. EXAM: ABDOMEN - 1 VIEW COMPARISON:  Radiographs of February 13, 2017. FINDINGS: There is significantly decreased bowel distention compared to prior exam, consistent with improving ileus. Gas is noted in the rectum. No radio-opaque calculi or other significant radiographic abnormality are seen. IMPRESSION: Significantly decreased bowel distention compared to prior exam, consistent with improving ileus. Electronically Signed   By: Marijo Conception, M.D.   On: 02/14/2017 08:32   Ct Head Wo Contrast  Result Date: 02/10/2017 CLINICAL DATA:  Acute onset of dizziness after fall. Generalized weakness. Initial encounter. EXAM: CT HEAD WITHOUT CONTRAST TECHNIQUE: Contiguous axial images were obtained from the base  of the skull through the vertex without intravenous contrast. COMPARISON:  CT of the head performed 08/19/2015 FINDINGS: Brain: No evidence of acute infarction, hemorrhage, hydrocephalus, extra-axial collection or mass lesion/mass effect. Prominence of the ventricles and sulci reflects mild cortical volume loss. Mild cerebellar atrophy is noted. The brainstem and fourth ventricle are within normal limits. The basal ganglia are unremarkable in appearance. The cerebral hemispheres demonstrate grossly normal gray-white differentiation. No mass effect or midline shift is seen. Vascular: No hyperdense vessel or unexpected calcification. Skull: There is no evidence of fracture; visualized osseous structures are unremarkable in appearance. Sinuses/Orbits: The visualized portions of the orbits are within normal limits. The paranasal sinuses and mastoid air cells are well-aerated. Other: No significant soft tissue abnormalities are seen. IMPRESSION: 1. No evidence of traumatic intracranial injury or fracture. 2. Mild cortical volume loss noted. Electronically Signed   By: Garald Balding M.D.   On: 02/10/2017 03:15   US Venous Img Upper Uni Left  Result Date: 02/16/2017 CLINICAL DATA:  Left upper extremity pain and redness EXAM: LEFT UPPER EXTREMITY VENOUS DOPPLER ULTRASOUND TECHNIQUE: Gray-scale sonography with graded compression, as well as color Doppler and duplex ultrasound were performed to evaluate the upper extremity deep venous system from the level of the subclavian vein and including the jugular, axillary, basilic, radial, ulnar and upper cephalic vein. Spectral Doppler was utilized to evaluate flow at rest and with distal augmentation maneuvers. COMPARISON:  None. FINDINGS: Contralateral Subclavian Vein: Respiratory phasicity is normal and symmetric with  the symptomatic side. No evidence of thrombus. Normal compressibility. Internal Jugular Vein: No evidence of thrombus. Normal compressibility, respiratory  phasicity and response to augmentation. Subclavian Vein: No evidence of thrombus. Normal compressibility, respiratory phasicity and response to augmentation. Axillary Vein: No evidence of thrombus. Normal compressibility, respiratory phasicity and response to augmentation. Cephalic Vein: Occlusive thrombus in the mid to distal segments. Basilic Vein: No evidence of thrombus. Normal compressibility, respiratory phasicity and response to augmentation. Brachial Veins: No evidence of thrombus. Normal compressibility, respiratory phasicity and response to augmentation. Radial Veins: No evidence of thrombus. Normal compressibility, respiratory phasicity and response to augmentation. Ulnar Veins: No evidence of thrombus. Normal compressibility, respiratory phasicity and response to augmentation. Venous Reflux:  None visualized. Other Findings:  None visualized. IMPRESSION: 1. No evidence of DVT within the left upper extremity. 2. Occlusive thrombus in the mid to distal cephalic vein. Electronically Signed   By: Titus Dubin M.D.   On: 02/16/2017 16:00   Dg Chest Port 1 View  Result Date: 02/13/2017 CLINICAL DATA:  NG tube placement. EXAM: PORTABLE CHEST 1 VIEW COMPARISON:  02/13/2017 FINDINGS: Catheter projects over the mid thorax, tip is not visualized. Cardiomediastinal silhouette is normal for portable technique. Mediastinal contours appear intact. There is no evidence of focal airspace consolidation, pleural effusion or pneumothorax. Low lung volumes Osseous structures are without acute abnormality. Soft tissues are grossly normal. IMPRESSION: The tip of the enteric catheter is not visualized. Low lung volumes. Electronically Signed   By: Fidela Salisbury M.D.   On: 02/13/2017 15:15   Dg Chest Port 1 View  Result Date: 02/13/2017 CLINICAL DATA:  Dizziness, GI bleeding and hypotension. EXAM: PORTABLE CHEST 1 VIEW COMPARISON:  10/27/2011 FINDINGS: The heart size and mediastinal contours are within normal  limits. Lung volumes are low bilaterally. There is elevation of the right hemidiaphragm. Bibasilar scarring and atelectasis present. There is no evidence of pulmonary edema, consolidation, pneumothorax, nodule or pleural fluid. The visualized skeletal structures are unremarkable. IMPRESSION: Low lung volumes with bibasilar scarring/ atelectasis. Electronically Signed   By: Aletta Edouard M.D.   On: 02/13/2017 13:57   Dg Abd Portable 1v  Result Date: 02/13/2017 CLINICAL DATA:  Abdominal distention.  GI bleed and hypotension. EXAM: PORTABLE ABDOMEN - 1 VIEW COMPARISON:  CT of the abdomen and pelvis on 02/10/2017 FINDINGS: Increased gaseous distention of bowel since the recent CT study, especially of the colon. Some gas is also present in small bowel. Findings are consistent with ileus. No gross free air or pneumatosis identified. IMPRESSION: Increase gaseous distention of primarily the colon is consistent with ileus. Electronically Signed   By: Aletta Edouard M.D.   On: 02/13/2017 13:55      Assessment/Plan 1.  Superficial thrombophlebitis of the left arm.  This is likely related to a previous IV or blood draw.  This is not a deep venous thrombosis and would not require anticoagulation.  In this patient, he is not a candidate for anticoagulation anyway so there would not be a role for an SVC filter or other device at this time.  A superficial thrombophlebitis would not be treated with any percutaneous thrombectomy procedure.  Elevate the arm as tolerated.  Use warm compresses.  If he can tolerate aspirin, aspirin would be recommended.  No other recommendations from a vascular point of view 2.  Diabetes.  Stable on outpatient medications and blood glucose control important in reducing the progression of atherosclerotic disease. Also, involved in wound healing. On appropriate medications. 3.  Hypertension.  Stable on outpatient medications and blood pressure control important in reducing the progression  of atherosclerotic disease. On appropriate oral medications. 4.  Atrial fibrillation/flutter.  Followed by cardiology.  Has already been told he is not a candidate for anticoagulation.   Leotis Pain, MD  02/17/2017 6:40 PM    This note was created with Dragon medical transcription system.  Any error is purely unintentional

## 2017-02-17 NOTE — Plan of Care (Signed)
Problem: Physical Regulation: Goal: Will remain free from infection Outcome: Progressing Pt will continue on antibiotics

## 2017-02-17 NOTE — Clinical Social Work Note (Addendum)
CSW spoke with patient and he is requesting to go to SNF for short term rehab before he is able to return back to the group home.  Patient gave CSW permission to begin bed search in [place]. Patient is a manual screen for [facility], awaiting [facility] number, patient can not discharge to SNF until [facility] number is received.  Jones Broom. [place], MSW, [place]  02/17/2017 5:15 PM

## 2017-02-17 NOTE — NC FL2 (Signed)
[place] LEVEL OF CARE SCREENING TOOL     IDENTIFICATION  Patient Name: Lawrence Johnston Birthdate: 06/14/1960 Sex: male Admission Date (Current Location): 02/10/2017  [place] and [place] Number:  Engineering geologist and Address:  [facility], [address] [place], [place] [address]      Provider Number: [id number]  Attending Physician Name and Address:  Epifanio Lesches, MD  Relative Name and Phone Number:     [name] Mother [phone]       Current Level of Care: Hospital Recommended Level of Care: [place] Prior Approval Number:    Date Approved/Denied:   PASRR Number:  Pending  Discharge Plan: SNF    Current Diagnoses: Patient Active Problem List   Diagnosis Date Noted  . Antineoplastic chemotherapy induced pancytopenia ([place])   . Symptomatic anemia 02/10/2017  . 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 Schizophrenia ([place]) 06/19/2013    Orientation RESPIRATION BLADDER Height & Weight     Self, Time, Situation, Place  Normal Continent Weight: 263 lb 6.4 oz (119.5 kg) Height:  5\' 9"  (175.3 cm)  BEHAVIORAL SYMPTOMS/MOOD NEUROLOGICAL BOWEL NUTRITION STATUS      Continent Diet (Cardiac)  AMBULATORY STATUS COMMUNICATION OF NEEDS Skin   Limited Assist Verbally Normal                       Personal Care Assistance Level of Assistance  Bathing, Feeding, Dressing Bathing Assistance: Limited assistance Feeding assistance: Independent Dressing Assistance: Limited assistance     Functional Limitations Info  Sight, Speech, Hearing Sight Info: Adequate Hearing Info: Adequate Speech Info: Adequate    SPECIAL CARE FACTORS FREQUENCY  PT (By licensed PT)     PT Frequency:  (2x a week)               Contractures Contractures Info: Not present    Additional Factors Info  Code Status, Allergies Code Status Info: Full Code Allergies Info: NKA           Current Medications (02/17/2017):  This is the current hospital active medication list Current Facility-Administered Medications  Medication Dose Route Frequency Provider Last Rate Last Dose  . acetaminophen (TYLENOL) tablet 650 mg  650 mg Oral Q6H PRN Harrie Foreman, MD   650 mg at 02/16/17 1123   Or  . acetaminophen (TYLENOL) suppository 650 mg  650 mg Rectal Q6H PRN Harrie Foreman, MD      . amiodarone (PACERONE) tablet 200 mg  200 mg Oral BID Teodoro Spray, MD   200 mg at 02/17/17 1035  . benztropine (COGENTIN) tablet 0.5 mg  0.5 mg Oral BID Gladstone Lighter, MD   0.5 mg at 02/17/17 1038  . cephALEXin (KEFLEX) capsule 250 mg  250 mg Oral Q12H Epifanio Lesches, MD   250 mg at 02/17/17 1037  . clonazePAM (KLONOPIN) tablet 0.5 mg  0.5 mg Oral BID Clapacs, John T, MD   0.5 mg at 02/17/17 1035  . divalproex (DEPAKOTE ER) 24 hr tablet 2,000 mg  2,000 mg Oral QHS Clapacs, John T, MD   2,000 mg at 02/17/17 1044  . docusate sodium (COLACE) capsule 100 mg  100 mg Oral BID Harrie Foreman, MD   100 mg at 02/17/17 1036  . FLUoxetine (PROZAC) capsule 20 mg  20 mg Oral  Daily Harrie Foreman, MD   20 mg at 02/17/17 1037  . gabapentin (NEURONTIN) capsule 300 mg  300 mg Oral QHS Harrie Foreman, MD   300 mg at 02/16/17 2134  . insulin aspart (novoLOG) injection 0-5 Units  0-5 Units Subcutaneous QHS Harrie Foreman, MD      . insulin aspart (novoLOG) injection 0-9 Units  0-9 Units Subcutaneous TID WC Harrie Foreman, MD   1 Units at 02/17/17 1210  . lactulose (CHRONULAC) 10 GM/15ML solution 30 g  30 g Oral BID Epifanio Lesches, MD   30 g at 02/17/17 1037  . multivitamins with iron tablet 1 tablet  1 tablet Oral Daily Harrie Foreman, MD   1 tablet at 02/17/17 1038  . OLANZapine (ZYPREXA) tablet 10 mg  10 mg  Oral QHS Clapacs, Madie Reno, MD   10 mg at 02/16/17 2133  . ondansetron (ZOFRAN) tablet 4 mg  4 mg Oral Q6H PRN Harrie Foreman, MD       Or  . ondansetron [facility]) injection 4 mg  4 mg Intravenous Q6H PRN Harrie Foreman, MD      . pantoprazole (PROTONIX) EC tablet 40 mg  40 mg Oral Daily Harrie Foreman, MD   40 mg at 02/17/17 1035  . polyethylene glycol (MIRALAX / GLYCOLAX) packet 17 g  17 g Oral Daily Harrie Foreman, MD   17 g at 02/17/17 1037  . QUEtiapine (SEROQUEL) tablet 100 mg  100 mg Oral QHS Epifanio Lesches, MD   100 mg at 02/16/17 2134  . rOPINIRole (REQUIP) tablet 2 mg  2 mg Oral QHS Harrie Foreman, MD   2 mg at 02/16/17 2134  . rosuvastatin (CRESTOR) tablet 20 mg  20 mg Oral QHS Harrie Foreman, MD   20 mg at 02/16/17 2134     Discharge Medications: Please see discharge summary for a list of discharge medications.  Relevant Imaging Results:  Relevant Lab Results:   Additional Information SSN [id number]  Ross Ludwig, [place]

## 2017-02-17 NOTE — Progress Notes (Signed)
[facility] [place] NAME: Lawrence Johnston    MR#:  [id number]  DATE OF BIRTH:  1960-07-18 deniesAbdominal pain, chest pain. He denies an Risk analyst Complaint  Patient presents with  . Weakness     REVIEW OF SYSTEMS:  Review of Systems  Constitutional: Negative for chills and fever.  HENT: Negative for congestion, ear discharge, hearing loss and nosebleeds.   Eyes: Negative for blurred vision.  Respiratory: Negative for cough, shortness of breath and wheezing.   Cardiovascular: Negative for chest pain, palpitations and leg swelling.  Gastrointestinal: Negative for abdominal pain, constipation, diarrhea, nausea and vomiting.  Genitourinary: Negative for dysuria and urgency.  Musculoskeletal: Negative for myalgias.  Neurological: Positive for tremors. Negative for dizziness, speech change, focal weakness, seizures and headaches.  Psychiatric/Behavioral: Negative for depression.    DRUG ALLERGIES:  No Known Allergies  VITALS:  Blood pressure 129/69, pulse 80, temperature 98.2 F (36.8 C), temperature source Oral, resp. rate 18, height 5\' 9"  (1.753 m), weight 119.5 kg (263 lb 6.4 oz), SpO2 98 %.  PHYSICAL EXAMINATION:  Physical Exam  GENERAL:  56 y.o.-year-old patient lying in the bed with no acute distress. Significant dyskinesias noted around the lip, tremors of hands and legs EYES: Pupils equal, round, reactive to light and accommodation. No scleral icterus. Extraocular muscles intact. Pale conjunctiva HEENT: Head atraumatic, normocephalic. Oropharynx and nasopharynx clear.  NECK:  Supple, no jugular venous distention. No thyroid enlargement, no tenderness.  LUNGS: Normal breath sounds bilaterally, no wheezing, rales,rhonchi or crepitation. No use of accessory muscles of respiration. Decreased bibasilar breath sounds noted CARDIOVASCULAR: S1, S2 normal. No  rubs, or gallops. 2/6 systolic murmur present ABDOMEN: slight distension..  Bowel sounds present. No organomegaly or mass.  EXTREMITIES: No cyanosis, or clubbing. 2+ bilateral pedal edema NEUROLOGIC: Cranial nerves II through XII are intact. Muscle strength 5/5 in all extremities. Global weakness present. Sensation intact. Gait not checked.  PSYCHIATRIC: The patient is alert and oriented x 3.  SKIN: No obvious rash, lesion, or ulcer.    LABORATORY PANEL:   CBC  Recent Labs Lab 02/17/17 1314  WBC 1.3*  HGB 8.3*  HCT 24.5*  PLT 40*   ------------------------------------------------------------------------------------------------------------------  Chemistries   Recent Labs Lab 02/12/17 0404  NA 140  K 3.8  CL 113*  CO2 20*  GLUCOSE 124*  BUN 19  CREATININE 1.60*  CALCIUM 7.8*   ------------------------------------------------------------------------------------------------------------------  Cardiac Enzymes No results for input(s): TROPONINI in the last 168 hours. ------------------------------------------------------------------------------------------------------------------  RADIOLOGY:  US Venous Img Upper Uni Left  Result Date: 02/16/2017 CLINICAL DATA:  Left upper extremity pain and redness EXAM: LEFT UPPER EXTREMITY VENOUS DOPPLER ULTRASOUND TECHNIQUE: Gray-scale sonography with graded compression, as well as color Doppler and duplex ultrasound were performed to evaluate the upper extremity deep venous system from the level of the subclavian vein and including the jugular, axillary, basilic, radial, ulnar and upper cephalic vein. Spectral Doppler was utilized to evaluate flow at rest and with distal augmentation maneuvers. COMPARISON:  None. FINDINGS: Contralateral Subclavian Vein: Respiratory phasicity is normal and symmetric with the symptomatic side. No evidence of thrombus. Normal compressibility. Internal Jugular Vein: No evidence of thrombus. Normal compressibility, respiratory phasicity and response to augmentation. Subclavian Vein: No  evidence of thrombus. Normal compressibility, respiratory phasicity and response to augmentation. Axillary Vein: No evidence of thrombus. Normal compressibility, respiratory phasicity and response to augmentation. Cephalic Vein: Occlusive thrombus in the mid to distal segments. Basilic Vein: No evidence of thrombus.  Normal compressibility, respiratory phasicity and response to augmentation. Brachial Veins: No evidence of thrombus. Normal compressibility, respiratory phasicity and response to augmentation. Radial Veins: No evidence of thrombus. Normal compressibility, respiratory phasicity and response to augmentation. Ulnar Veins: No evidence of thrombus. Normal compressibility, respiratory phasicity and response to augmentation. Venous Reflux:  None visualized. Other Findings:  None visualized. IMPRESSION: 1. No evidence of DVT within the left upper extremity. 2. Occlusive thrombus in the mid to distal cephalic vein. Electronically Signed   By: Titus Dubin M.D.   On: 02/16/2017 16:00    EKG:   Orders placed or performed during the hospital encounter of 02/10/17  . EKG 12-Lead  . EKG 12-Lead  . EKG 12-Lead  . EKG 12-Lead  . EKG 12-Lead  . EKG 12-Lead  . EKG 12-Lead  . EKG 12-Lead  . EKG 12-Lead  . EKG 12-Lead  . EKG 12-Lead  . EKG 12-Lead  . EKG 12-Lead  . EKG 12-Lead  . EKG 12-Lead  . EKG 12-Lead  . EKG 12-Lead  . EKG 12-Lead  . EKG 12-Lead  . 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 In  Normal sinus rhythm. amiodarone  Drip weaned off, started on by mouth amiodarone by cardiology. Not a candidate for full dose anticoagulation because of pancytopenia.   #2 .acute renal failure-acute on  chronic kidney disease, renal function worsened from creatinine of 1.5 -no obstruction noted on CT Patient's creatinine more than 4 on admission, today on 1.6. Urine retention with more than 600 mL of urine by bladder scan Failed voiding trials on time,, DC foley,try voiding trials #3 pancytopenia-secondary to worsening CML versus restarted on Gleevec about 6 weeks ago for symptoms have been persistent for almost 4 weeks now. -Hold Gleevec.status post transfusion of packed RBC,hemoglobin stable at 8.5, platelets stable at 40. WBC 1.4 but the discussed with Dr. Grayland Ormond does not think he needs any neutropenic precautions.  -#4. schizophrenia, schizoaffective disorder: Prolongation of QT interval, patient has tardive dyskinesias.psych followin.   #5 .diabetes mellitus type 2:, Controlled, continue sliding scale with coverage.   #6 DVT prophylaxis-Ted's and SCDs at this time  #7. deconditioning: Physical therapy consulted home health PT.pt from group home and daughter requested SNF,does not want to go there s.poke with Hassan Rowan today.  #8/ adynamic ileus: Improving, .resume previous diet.  39 left  Arm pain, tenderness around left elbow area ; on Keflex for cellulitis, patient also has thrombosis in cephalic vein on the left side, consult vascular, continue Keflex for 3 days. All the records are reviewed and case discussed with Care Management/Social Workerr. Management plans discussed with the patient, family and they are in agreement.  CODE STATUS: Full Code  TOTAL TIME TAKING CARE OF THIS PATIENT: 37 minutes.   POSSIBLE D/C IN 2-3 DAYS, DEPENDING ON CLINICAL CONDITION.   Epifanio Lesches M.D on 02/17/2017 at 2:49 PM  Between 7am to 6pm - Pager - [phone]  After 6pm go to [email or web address] - password [email or web address] [facility]  Office  [phone]  CC: Primary care physician; Idelle Crouch, MD

## 2017-02-17 NOTE — Progress Notes (Signed)
Took foley out, pt tolerated well. Educated pt to call when he felt the urge to void. Also communicated with nurse tech to let us know if he voids. Will continue to monitor.

## 2017-02-17 NOTE — Progress Notes (Signed)
Rounded with MD. Pt had no complaints. Per MD, we can try to take out the foley and monitor pt. Will continue to monitor.

## 2017-02-18 LAB — GLUCOSE, CAPILLARY
Glucose-Capillary: 104 mg/dL — ABNORMAL HIGH (ref 65–99)
Glucose-Capillary: 141 mg/dL — ABNORMAL HIGH (ref 65–99)
Glucose-Capillary: 125 mg/dL — ABNORMAL HIGH (ref 65–99)
Glucose-Capillary: 104 mg/dL — ABNORMAL HIGH (ref 65–99)

## 2017-02-18 MED ORDER — BISACODYL 10 MG RE SUPP
10.0000 mg | Freq: Every day | RECTAL | Status: DC
  Administered 2017-02-18: 10 mg via RECTAL
  Filled 2017-02-18: qty 1

## 2017-02-18 MED ORDER — SENNOSIDES-DOCUSATE SODIUM 8.6-50 MG PO TABS
1.0000 | ORAL_TABLET | Freq: Two times a day (BID) | ORAL | Status: DC
  Administered 2017-02-18 – 2017-02-19 (×3): 1 via ORAL
  Filled 2017-02-18 (×3): qty 1

## 2017-02-18 MED ORDER — DIVALPROEX SODIUM ER 500 MG PO TB24
2000.0000 mg | ORAL_TABLET | Freq: Every day | ORAL | Status: DC
  Administered 2017-02-18: 2000 mg via ORAL
  Filled 2017-02-18 (×2): qty 4

## 2017-02-18 NOTE — Progress Notes (Addendum)
[facility] [place] NAME: Lawrence Johnston    MR#:  [id number]  DATE OF BIRTH:  12-26-1960  SUBJECTIVE:  CHIEF COMPLAINT:   Chief Complaint  Patient presents with  . Weakness   - Urinary retention, Foley catheter inserted. Appreciate urology input -Tremors and dyskinesia have improved -Once to go to SNF  REVIEW OF SYSTEMS:  Review of Systems  Constitutional: Positive for malaise/fatigue. Negative for chills and fever.  HENT: Negative for congestion, ear discharge, hearing loss, nosebleeds and sinus pain.   Respiratory: Negative for cough, shortness of breath and wheezing.   Cardiovascular: Negative for chest pain and palpitations.  Gastrointestinal: Positive for constipation. Negative for abdominal pain, diarrhea, nausea and vomiting.  Genitourinary: Negative for dysuria.  Neurological: Positive for tremors. Negative for dizziness, seizures and headaches.    DRUG ALLERGIES:  No Known Allergies  VITALS:  Blood pressure 136/67, pulse 88, temperature 98.5 F (36.9 C), temperature source Oral, resp. rate 18, height 5\' 9"  (1.753 m), weight 119.4 kg (263 lb 3.2 oz), SpO2 96 %.  PHYSICAL EXAMINATION:  Physical Exam  GENERAL:  56 y.o.-year-old patient lying in the bed with no acute distress.  EYES: Pupils equal, round, reactive to light and accommodation. No scleral icterus. Extraocular muscles intact.  HEENT: Head atraumatic, normocephalic. Oropharynx and nasopharynx clear. Orolingual dyskinesias noted NECK:  Supple, no jugular venous distention. No thyroid enlargement, no tenderness.  LUNGS: Normal breath sounds bilaterally, no wheezing, rales,rhonchi or crepitation. No use of accessory muscles of respiration. Decreased bibasilar breath sounds CARDIOVASCULAR: S1, S2 normal. No murmurs, rubs, or gallops.  ABDOMEN: Soft, nontender but distended. Hypoactive bowel sounds noted. No organomegaly or mass.  EXTREMITIES: No pedal edema,  cyanosis, or clubbing. Bilateral upper extremity tremors noted Left cubital fossa with Some redness and swelling and induration noted NEUROLOGIC: Cranial nerves II through XII are intact. Muscle strength 5/5 in all extremities. Sensation intact. Gait not checked.  PSYCHIATRIC: The patient is alert and oriented x 3.  SKIN: No obvious rash, lesion, or ulcer.    LABORATORY PANEL:   CBC  Recent Labs Lab 02/17/17 1314  WBC 1.3*  HGB 8.3*  HCT 24.5*  PLT 40*   ------------------------------------------------------------------------------------------------------------------  Chemistries   Recent Labs Lab 02/12/17 0404  NA 140  K 3.8  CL 113*  CO2 20*  GLUCOSE 124*  BUN 19  CREATININE 1.60*  CALCIUM 7.8*   ------------------------------------------------------------------------------------------------------------------  Cardiac Enzymes No results for input(s): TROPONINI in the last 168 hours. ------------------------------------------------------------------------------------------------------------------  RADIOLOGY:  US Venous Img Upper Uni Left  Result Date: 02/16/2017 CLINICAL DATA:  Left upper extremity pain and redness EXAM: LEFT UPPER EXTREMITY VENOUS DOPPLER ULTRASOUND TECHNIQUE: Gray-scale sonography with graded compression, as well as color Doppler and duplex ultrasound were performed to evaluate the upper extremity deep venous system from the level of the subclavian vein and including the jugular, axillary, basilic, radial, ulnar and upper cephalic vein. Spectral Doppler was utilized to evaluate flow at rest and with distal augmentation maneuvers. COMPARISON:  None. FINDINGS: Contralateral Subclavian Vein: Respiratory phasicity is normal and symmetric with the symptomatic side. No evidence of thrombus. Normal compressibility. Internal Jugular Vein: No evidence of thrombus. Normal compressibility, respiratory phasicity and response to augmentation. Subclavian Vein: No  evidence of thrombus. Normal compressibility, respiratory phasicity and response to augmentation. Axillary Vein: No evidence of thrombus. Normal compressibility, respiratory phasicity and response to augmentation. Cephalic Vein: Occlusive thrombus in the mid to distal segments. Basilic Vein: No evidence  of thrombus. Normal compressibility, respiratory phasicity and response to augmentation. Brachial Veins: No evidence of thrombus. Normal compressibility, respiratory phasicity and response to augmentation. Radial Veins: No evidence of thrombus. Normal compressibility, respiratory phasicity and response to augmentation. Ulnar Veins: No evidence of thrombus. Normal compressibility, respiratory phasicity and response to augmentation. Venous Reflux:  None visualized. Other Findings:  None visualized. IMPRESSION: 1. No evidence of DVT within the left upper extremity. 2. Occlusive thrombus in the mid to distal cephalic vein. Electronically Signed   By: Titus Dubin M.D.   On: 02/16/2017 16:00    EKG:   Orders placed or performed during the hospital encounter of 02/10/17  . EKG 12-Lead  . EKG 12-Lead  . EKG 12-Lead  . EKG 12-Lead  . EKG 12-Lead  . EKG 12-Lead  . EKG 12-Lead  . EKG 12-Lead  . EKG 12-Lead  . EKG 12-Lead  . EKG 12-Lead  . EKG 12-Lead  . EKG 12-Lead  . EKG 12-Lead  . EKG 12-Lead  . EKG 12-Lead  . EKG 12-Lead  . EKG 12-Lead  . EKG 12-Lead  . 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 fib in the hospital. -Converted back to sinus rhythm and rate is well controlled. -Off the amiodarone drip and on oral amiodarone. -Appreciate cardiology consult.  -not a candidate for oral anticoagulation due to anemia and  thrombocytopenia.  #2 .acute renal failure-acute on chronic kidney disease, renal function worsened from creatinine of 1.5 - resolved now -no obstruction noted on CT Patient's creatinine more than 4 on admission,   #3 Acute urinary retention- reinserted Foley catheter, appreciate urology for urinary retention. - continue foley, started flomax- voiding trial in 1 week as outpatient  #4 pancytopenia-secondary to worsening CML versus restarted on Gleevec about 6 weeks ago for symptoms have been persistent for almost 4 weeks now. -Hold Gleevec -Supported with transfusion-received 2 units of packed RBC, hemoglobin improved from 6.9-9.4. - Appreciate oncology consult. As needed transfusions.  #5. schizophrenia, schizoaffective disorder: Prolongation of QT interval, patient has tardive dyskinesias and tremors. -Medications have been adjusted. Currently on Prozac, Seroquel and zyprexa. -Will verify with psych if needs to be on Depakote or not -Cogentin and ropinirole helping with the dyskinesias   #6 diabetes mellitus type 2:, Controlled, continue sliding scale with coverage.  #7. deconditioning: Physical therapy consulted home health PT.pt from group home and daughter requested SNF Social worker consulted.  #8/ adynamic ileus: Improving, started laxatives,  --- on soft diet.   #9 DVT Prophylaxis- TEDs and SCDs  #10 left arm superficial thrombophlebitis-appreciate vascular consult. -Continue warm compresses and keep the arm elevated -On Keflex- will finish off in 2 days - no indication for anticoagulation or SVC filter     All the records are reviewed and case discussed with Care Management/Social Workerr. Management plans discussed with the patient, family and they are in agreement.  CODE STATUS: Full Code  TOTAL TIME TAKING CARE OF THIS PATIENT: 38 minutes.   POSSIBLE D/C IN 1-2 DAYS, DEPENDING ON CLINICAL CONDITION.   Gladstone Lighter M.D on 02/18/2017 at 9:33  AM  Between 7am to 6pm - Pager - [phone]  After 6pm go to [email or web address] - password [email or web address]  [employer]  [phone]  CC: Primary care physician; Idelle Crouch, MD

## 2017-02-18 NOTE — Progress Notes (Signed)
Nutrition Brief Note  Patient identified for approaching length of stay of 10 days.  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 Patient felt he got a little too big and wanted to lose weight. He reports 20 pound weight loss over 2 months. Unable to corroborate this from chart. Patient has been eating well since admitted. Ate 100% of meatloaf and potatoes last night, had pancakes this morning. Seems to be eating normal again.   Wt Readings from Last 15 Encounters:  02/18/17 263 lb 3.2 oz (119.4 kg)  12/30/16 222 lb 11.2 oz (101 kg)  12/10/16 226 lb (102.5 kg)  10/28/16 222 lb (100.7 kg)  09/30/16 224 lb (101.6 kg)  08/26/16 221 lb 8 oz (100.5 kg)  05/07/16 241 lb 1.2 oz (109.4 kg)  03/24/16 227 lb 4.7 oz (103.1 kg)  03/11/16 227 lb 1.2 oz (103 kg)  08/19/15 224 lb (101.6 kg)  06/06/15 240 lb (108.9 kg)  04/12/15 240 lb (108.9 kg)  02/25/15 240 lb (108.9 kg)  01/09/15 240 lb (108.9 kg)  06/19/13 282 lb 3 oz (128 kg)    Body mass index is 38.87 kg/m. Patient meets criteria for obese class II based on current BMI.   Current diet order is heart healthy/carb modified, patient is consuming approximately 85-100% of meals at this time. Labs and medications reviewed.   No nutrition interventions warranted at this time. If nutrition issues arise, please consult RD.   Satira Anis. Trinten Boudoin, MS, RD LDN Inpatient Clinical Dietitian Pager [phone]

## 2017-02-18 NOTE — Clinical Social Work Note (Signed)
CSW presented bed offers to patient's Aunt and mother and they chose [facility].  CSW awaiting for [facility] number, [facility] can accept patient once [facility] number has been received.  Jones Broom. [place], MSW, [place]  02/18/2017 6:03 PM

## 2017-02-18 NOTE — Consult Note (Signed)
Spoke with nurse WY:[id number] for urinary retention. Patient has foley and has failed trial of void. No inpatient urologic intervention indicated. Patient should be discharged home with foley. Will arrange outpatient urological follow up.

## 2017-02-19 LAB — BASIC METABOLIC PANEL
Anion gap: 5 (ref 5–15)
BUN: 8 mg/dL (ref 6–20)
CALCIUM: 8 mg/dL — AB (ref 8.9–10.3)
CO2: 24 mmol/L (ref 22–32)
Chloride: 112 mmol/L — ABNORMAL HIGH (ref 101–111)
Creatinine, Ser: 0.97 mg/dL (ref 0.61–1.24)
GFR calc Af Amer: >60 mL/min (ref >60)
GLUCOSE: 111 mg/dL — AB (ref 65–99)
Potassium: 3.3 mmol/L — ABNORMAL LOW (ref 3.5–5.1)
Sodium: 141 mmol/L (ref 135–145)

## 2017-02-19 LAB — CBC
HCT: 24.1 % — ABNORMAL LOW (ref 40.0–52.0)
Hemoglobin: 7.9 g/dL — ABNORMAL LOW (ref 13.0–18.0)
MCH: 31.2 pg (ref 26.0–34.0)
MCHC: 32.6 g/dL (ref 32.0–36.0)
MCV: 95.7 fL (ref 80.0–100.0)
PLATELETS: 45 10*3/uL — AB (ref 150–440)
RBC: 2.52 MIL/uL — ABNORMAL LOW (ref 4.40–5.90)
RDW: 18.7 % — AB (ref 11.5–14.5)
WBC: 0.9 10*3/uL — CL (ref 3.8–10.6)

## 2017-02-19 LAB — BCR-ABL1, CML/ALL, PCR, QUANT: b3a2 transcript: 3.938 %

## 2017-02-19 LAB — GLUCOSE, CAPILLARY
Glucose-Capillary: 98 mg/dL (ref 65–99)
Glucose-Capillary: 134 mg/dL — ABNORMAL HIGH (ref 65–99)
Glucose-Capillary: 106 mg/dL — ABNORMAL HIGH (ref 65–99)

## 2017-02-19 MED ORDER — TAMSULOSIN HCL 0.4 MG PO CAPS: 0.4000 mg | ORAL_CAPSULE | Freq: Every day | ORAL | 2 refills | Status: AC

## 2017-02-19 MED ORDER — CLONAZEPAM 0.5 MG PO TABS: 0.5000 mg | ORAL_TABLET | Freq: Two times a day (BID) | ORAL | 0 refills | Status: AC

## 2017-02-19 MED ORDER — AMIODARONE HCL 200 MG PO TABS: 200.0000 mg | ORAL_TABLET | Freq: Two times a day (BID) | ORAL | 0 refills | Status: AC

## 2017-02-19 MED ORDER — OLANZAPINE 10 MG PO TABS: 10.0000 mg | ORAL_TABLET | Freq: Every day | ORAL | 2 refills | Status: AC

## 2017-02-19 MED ORDER — QUETIAPINE FUMARATE 100 MG PO TABS: 100.0000 mg | ORAL_TABLET | Freq: Every day | ORAL | 2 refills | Status: AC

## 2017-02-19 MED ORDER — POTASSIUM CHLORIDE CRYS ER 20 MEQ PO TBCR: 40.0000 meq | EXTENDED_RELEASE_TABLET | Freq: Once | ORAL | Status: DC

## 2017-02-19 NOTE — Discharge Summary (Signed)
[facility] [place] NAME: Lawrence Johnston    MR#:  [id number]  DATE OF BIRTH:  09/27/1960  DATE OF ADMISSION:  02/10/2017   ADMITTING PHYSICIAN: Harrie Foreman, MD  DATE OF DISCHARGE: 02/19/17  PRIMARY CARE PHYSICIAN: Idelle Crouch, MD   ADMISSION DIAGNOSIS:   Lactic acidosis [E87.2] Dizziness [R42] Anemia due to acute blood loss [D62] Gastrointestinal hemorrhage associated with anorectal source [K62.5] Hypotension, unspecified hypotension type [I95.9] Acute renal failure, unspecified acute renal failure type ([place]) [N17.9]  DISCHARGE DIAGNOSIS:   Principal Problem:   Schizophrenia ([place]) Active Problems:   Neuroleptic-induced tardive dyskinesia   CML (chronic myeloid leukemia) (HCC)   Symptomatic anemia   Antineoplastic chemotherapy induced pancytopenia ([place])   SECONDARY DIAGNOSIS:   Past Medical History:  Diagnosis Date  . Arrhythmia    Atrial Fibrillation; Atrial Flutter w/RVR; SVT  . Depression   . Diabetes ([place])   . Dysrhythmia   . GERD (gastroesophageal reflux disease)   . Hypercholesteremia   . Hypertension   . Schizophrenia ([place])   . Sleep apnea   . Tremor     HOSPITAL COURSE:   56 year old male with past medical history significant for CML started back on K VAC 6 weeks ago, hypertension, schizophrenia and atrial fibrillation not on anticoagulation presents to hospital secondary to worsening weakness  #.1 .Atrial fibrillation with RVR: New onset A. fib in the hospital. -Converted back to sinus rhythm and rate is well controlled. -Off the amiodarone drip and on oral amiodarone. -Appreciate cardiology consult.  -not a candidate for oral anticoagulation due to anemia and thrombocytopenia.  #2 .acute renal failure-acute on chronic kidney disease, renal function worsened from creatinine of 1.5 - resolved now -no obstruction noted on CT Patient's creatinine more than 4 on admission,   #3 Acute  urinary retention- reinserted Foley catheter, appreciate urology for urinary retention. - continue foley, started flomax- voiding trial in 1 week as outpatient  #4 pancytopenia-secondary to worsening CML versus restarted on Gleevec about 6 weeks ago for symptoms have been persistent for almost 4 weeks now. -Hold Gleevec - Appears likely more from progression of his CML. Gleevec has been off for almost 2 weeks now. Will need a bone marrow biopsy as outpatient. Discussed with hematology about discharge. Recommended discharge and outpatient follow-up in 3 days. Neutropenic precautions as outpatient and advised to come to emergency room if has a fever. -Supported low hb with transfusion-received 2 units of packed RBC, hemoglobin improved from 6.9-9.4. - Appreciate oncology consult. As needed transfusions.  #5. schizophrenia, schizoaffective disorder: Prolongation of QT interval, patient has tardive dyskinesias and tremors. -Medications have been adjusted. Currently on Prozac, depakote, Seroquel and zyprexa. -Cogentin and ropinirole helping with the dyskinesias  #6 diabetes mellitus type 2:, Controlled .  #7. deconditioning: Physical therapy consulted home health PT.pt from group home and mother requested SNF Social worker consulted.  #8/ adynamic ileus: Improved, had several bowel movements today --- on soft diet.   #9 left arm superficial thrombophlebitis-appreciate vascular consult. -Continue warm compresses and keep the arm elevated -On Keflex- will finish off in 2 days - no indication for anticoagulation or SVC filter  Being discharged to rehab today- [facility]  DISCHARGE CONDITIONS:   Guarded  CONSULTS OBTAINED:   Treatment Team:  Gonzella Lex, MD Lloyd Huger, MD Ramond Dial, MD Teodoro Spray, MD  DRUG ALLERGIES:   No Known Allergies DISCHARGE MEDICATIONS:   Allergies as of 02/19/2017  No Known Allergies     Medication List    STOP  taking these medications   allopurinol 300 MG tablet Commonly known as:  ZYLOPRIM   fluPHENAZine 5 MG tablet Commonly known as:  PROLIXIN   furosemide 20 MG tablet Commonly known as:  LASIX   imatinib 400 MG tablet Commonly known as:  GLEEVEC   INGREZZA 80 MG Caps Generic drug:  Valbenazine Tosylate   LORazepam 0.5 MG tablet Commonly known as:  ATIVAN   sitaGLIPtin 100 MG tablet Commonly known as:  JANUVIA     TAKE these medications   amiodarone 200 MG tablet Commonly known as:  PACERONE Take 1 tablet (200 mg total) by mouth 2 (two) times daily. X 1 week and then decrease to once a day   benztropine 0.5 MG tablet Commonly known as:  COGENTIN Take 0.5 mg by mouth 2 (two) times daily.   clonazePAM 0.5 MG tablet Commonly known as:  KLONOPIN Take 1 tablet (0.5 mg total) by mouth 2 (two) times daily.   divalproex 500 MG 24 hr tablet Commonly known as:  DEPAKOTE ER Take 2,000 mg by mouth at bedtime.   FLUoxetine 20 MG capsule Commonly known as:  PROZAC Take 20 mg by mouth daily.   gabapentin 300 MG capsule Commonly known as:  NEURONTIN Take 300 mg by mouth at bedtime.   metFORMIN 500 MG tablet Commonly known as:  GLUCOPHAGE Take 500 mg by mouth 2 (two) times daily with a meal.   metoprolol succinate 25 MG 24 hr tablet Commonly known as:  TOPROL-XL Take 25 mg by mouth 2 (two) times daily.   multivitamins with iron Tabs tablet Take 1 tablet by mouth daily.   OLANZapine 10 MG tablet Commonly known as:  ZYPREXA Take 1 tablet (10 mg total) by mouth at bedtime.   pantoprazole 40 MG tablet Commonly known as:  PROTONIX Take 40 mg by mouth daily.   polyethylene glycol packet Commonly known as:  MIRALAX / GLYCOLAX Take 17 g by mouth daily.   QUEtiapine 100 MG tablet Commonly known as:  SEROQUEL Take 1 tablet (100 mg total) by mouth at bedtime. What changed:  medication strength  how much to take   rOPINIRole 2 MG tablet Commonly known as:  REQUIP Take  2 mg by mouth at bedtime.   rosuvastatin 20 MG tablet Commonly known as:  CRESTOR Take 20 mg by mouth at bedtime.   senna-docusate 8.6-50 MG tablet Commonly known as:  Senokot-S Take 1 tablet by mouth 2 (two) times daily.   tamsulosin 0.4 MG Caps capsule Commonly known as:  FLOMAX Take 1 capsule (0.4 mg total) by mouth daily.        DISCHARGE INSTRUCTIONS:   1. PCP f/u in 1 week 2. Oncology f/u in 3 days  DIET:   Cardiac diet  ACTIVITY:   Activity as tolerated  OXYGEN:   Home Oxygen: No.  Oxygen Delivery: room air  DISCHARGE LOCATION:   nursing home   If you experience worsening of your admission symptoms, develop shortness of breath, life threatening emergency, suicidal or homicidal thoughts you must seek medical attention immediately by calling 911 or calling your MD immediately  if symptoms less severe.  You Must read complete instructions/literature along with all the possible adverse reactions/side effects for all the Medicines you take and that have been prescribed to you. Take any new Medicines after you have completely understood and accpet all the possible adverse reactions/side effects.   Please note  You were cared for by a hospitalist during your hospital stay. If you have any questions about your discharge medications or the care you received while you were in the hospital after you are discharged, you can call the unit and asked to speak with the hospitalist on call if the hospitalist that took care of you is not available. Once you are discharged, your primary care physician will handle any further medical issues. Please note that NO REFILLS for any discharge medications will be authorized once you are discharged, as it is imperative that you return to your primary care physician (or establish a relationship with a primary care physician if you do not have one) for your aftercare needs so that they can reassess your need for medications and monitor your  lab values.    On the day of Discharge:  VITAL SIGNS:   Blood pressure 128/79, pulse 78, temperature 98.4 F (36.9 C), temperature source Oral, resp. rate 16, height 5' 9"  (1.753 m), weight 118.2 kg (260 lb 8 oz), SpO2 97 %.  PHYSICAL EXAMINATION:    GENERAL:  56 y.o.-year-old patient lying in the bed with no acute distress.  EYES: Pupils equal, round, reactive to light and accommodation. No scleral icterus. Extraocular muscles intact.  HEENT: Head atraumatic, normocephalic. Oropharynx and nasopharynx clear. Orolingual dyskinesias noted NECK:  Supple, no jugular venous distention. No thyroid enlargement, no tenderness.  LUNGS: Normal breath sounds bilaterally, no wheezing, rales,rhonchi or crepitation. No use of accessory muscles of respiration. Decreased bibasilar breath sounds CARDIOVASCULAR: S1, S2 normal. No murmurs, rubs, or gallops.  ABDOMEN: Soft, nontender but distended. Hypoactive bowel sounds noted. No organomegaly or mass.  EXTREMITIES: No pedal edema, cyanosis, or clubbing. Bilateral upper extremity tremors noted Left cubital fossa with Some redness and swelling and induration noted NEUROLOGIC: Cranial nerves II through XII are intact. Muscle strength 5/5 in all extremities. Sensation intact. Gait not checked.  PSYCHIATRIC: The patient is alert and oriented x 3.  SKIN: No obvious rash, lesion, or ulcer.    DATA REVIEW:   CBC  Recent Labs Lab 02/19/17 0455  WBC 0.9*  HGB 7.9*  HCT 24.1*  PLT 45*    Chemistries   Recent Labs Lab 02/19/17 0455  NA 141  K 3.3*  CL 112*  CO2 24  GLUCOSE 111*  BUN 8  CREATININE 0.97  CALCIUM 8.0*     Microbiology Results  Results for orders placed or performed during the hospital encounter of 02/10/17  MRSA PCR Screening     Status: None   Collection Time: 02/10/17  4:54 AM  Result Value Ref Range Status   MRSA by PCR NEGATIVE NEGATIVE Final    Comment:        The GeneXpert MRSA Assay (FDA approved for NASAL  specimens only), is one component of a comprehensive MRSA colonization surveillance program. It is not intended to diagnose MRSA infection nor to guide or monitor treatment for MRSA infections.     RADIOLOGY:  No results found.   Management plans discussed with the patient, family and they are in agreement.  CODE STATUS:     Code Status Orders        Start     Ordered   02/10/17 0513  Full code  Continuous     02/10/17 0512    Code Status History    Date Active Date Inactive Code Status Order ID Comments User Context   This patient has a current code status but no historical code  status.      TOTAL TIME TAKING CARE OF THIS PATIENT: 37 minutes.    Ayleen Mckinstry M.D on 02/19/2017 at 2:19 PM  Between 7am to 6pm - Pager - [phone]  After 6pm go to [email or web address] - Proofreader  [facility]  Office  [phone]  CC: Primary care physician; Idelle Crouch, MD   Note: This dictation was prepared with Dragon dictation along with smaller phrase technology. Any transcriptional errors that result from this process are unintentional.

## 2017-02-19 NOTE — Clinical Social Work Note (Addendum)
Patient to be d/c'ed today to [facility].  Patient and family agreeable to plans will transport via ems RN to call report [phone].  CSW updated patient's Aunt Drue Flirt [phone].  Evette Cristal, MSW, [place]

## 2017-02-19 NOTE — Progress Notes (Signed)
[facility] at [place] NAME: Lawrence Johnston    MR#:  [id number]  DATE OF BIRTH:  02-14-1961  SUBJECTIVE:  CHIEF COMPLAINT:   Chief Complaint  Patient presents with  . Weakness   - Doing well, no complaints at this time. -WBC decreased to 0.9. No fevers or chills  REVIEW OF SYSTEMS:  Review of Systems  Constitutional: Positive for malaise/fatigue. Negative for chills and fever.  HENT: Negative for congestion, ear discharge, hearing loss, nosebleeds and sinus pain.   Respiratory: Negative for cough, shortness of breath and wheezing.   Cardiovascular: Negative for chest pain and palpitations.  Gastrointestinal: Positive for constipation. Negative for abdominal pain, diarrhea, nausea and vomiting.  Genitourinary: Negative for dysuria.  Neurological: Positive for tremors. Negative for dizziness, seizures and headaches.    DRUG ALLERGIES:  No Known Allergies  VITALS:  Blood pressure 122/71, pulse 80, temperature 98.4 F (36.9 C), temperature source Oral, resp. rate 18, height 5' 9"  (1.753 m), weight 118.2 kg (260 lb 8 oz), SpO2 98 %.  PHYSICAL EXAMINATION:  Physical Exam  GENERAL:  56 y.o.-year-old patient lying in the bed with no acute distress.  EYES: Pupils equal, round, reactive to light and accommodation. No scleral icterus. Extraocular muscles intact.  HEENT: Head atraumatic, normocephalic. Oropharynx and nasopharynx clear. Orolingual dyskinesias noted NECK:  Supple, no jugular venous distention. No thyroid enlargement, no tenderness.  LUNGS: Normal breath sounds bilaterally, no wheezing, rales,rhonchi or crepitation. No use of accessory muscles of respiration. Decreased bibasilar breath sounds CARDIOVASCULAR: S1, S2 normal. No murmurs, rubs, or gallops.  ABDOMEN: Soft, nontender but distended. Hypoactive bowel sounds noted. No organomegaly or mass.  EXTREMITIES: No pedal edema, cyanosis, or clubbing. Bilateral upper extremity  tremors noted Left cubital fossa with Some redness and swelling and induration noted NEUROLOGIC: Cranial nerves II through XII are intact. Muscle strength 5/5 in all extremities. Sensation intact. Gait not checked.  PSYCHIATRIC: The patient is alert and oriented x 3.  SKIN: No obvious rash, lesion, or ulcer.    LABORATORY PANEL:   CBC  Recent Labs Lab 02/19/17 0455  WBC 0.9*  HGB 7.9*  HCT 24.1*  PLT 45*   ------------------------------------------------------------------------------------------------------------------  Chemistries   Recent Labs Lab 02/19/17 0455  NA 141  K 3.3*  CL 112*  CO2 24  GLUCOSE 111*  BUN 8  CREATININE 0.97  CALCIUM 8.0*   ------------------------------------------------------------------------------------------------------------------  Cardiac Enzymes No results for input(s): TROPONINI in the last 168 hours. ------------------------------------------------------------------------------------------------------------------  RADIOLOGY:  No results found.  EKG:   Orders placed or performed during the hospital encounter of 02/10/17  . EKG 12-Lead  . EKG 12-Lead  . EKG 12-Lead  . EKG 12-Lead  . EKG 12-Lead  . EKG 12-Lead  . EKG 12-Lead  . EKG 12-Lead  . EKG 12-Lead  . EKG 12-Lead  . EKG 12-Lead  . EKG 12-Lead  . EKG 12-Lead  . EKG 12-Lead  . EKG 12-Lead  . EKG 12-Lead  . EKG 12-Lead  . EKG 12-Lead  . EKG 12-Lead  . 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 fib in the hospital. -Converted back to sinus rhythm and rate is well controlled. -Off the amiodarone drip and on oral  amiodarone. -Appreciate cardiology consult.  -not a candidate for oral  anticoagulation due to anemia and thrombocytopenia.  #2 .acute renal failure-acute on chronic kidney disease, renal function worsened from creatinine of 1.5 - resolved now -no obstruction noted on CT Patient's creatinine more than 4 on admission,   #3 Acute urinary retention- reinserted Foley catheter, appreciate urology for urinary retention. - continue foley, started flomax- voiding trial in 1 week as outpatient  #4 pancytopenia-secondary to worsening CML versus restarted on Gleevec about 6 weeks ago for symptoms have been persistent for almost 4 weeks now. -Hold Gleevec - Appears likely more from progression of his CML. Gleevec has been off for almost 2 weeks now. Will need a bone marrow biopsy as outpatient. Discussed with hematology about discharge. Recommended discharge and outpatient follow-up in 3 days. Neutropenic precautions as outpatient and advised to come to emergency room if has a fever. -Supported low hb with transfusion-received 2 units of packed RBC, hemoglobin improved from 6.9-9.4. - Appreciate oncology consult. As needed transfusions.  #5. schizophrenia, schizoaffective disorder: Prolongation of QT interval, patient has tardive dyskinesias and tremors. -Medications have been adjusted. Currently on Prozac, Seroquel and zyprexa. -Will verify with psych if needs to be on Depakote or not -Cogentin and ropinirole helping with the dyskinesias   #6 diabetes mellitus type 2:, Controlled, continue sliding scale with coverage.  #7. deconditioning: Physical therapy consulted home health PT.pt from group home and mother requested SNF Social worker consulted.  #8/ adynamic ileus: Improved, had several bowel movements today --- on soft diet.   #9 DVT Prophylaxis- TEDs and SCDs  #10 left arm superficial thrombophlebitis-appreciate vascular consult. -Continue warm compresses and keep the arm elevated -On Keflex- will finish off in 2 days - no indication for anticoagulation  or SVC filter     All the records are reviewed and case discussed with Care Management/Social Workerr. Management plans discussed with the patient, family and they are in agreement.  CODE STATUS: Full Code  TOTAL TIME TAKING CARE OF THIS PATIENT: 38 minutes.   POSSIBLE D/C  TODAY OR TOMORROW, DEPENDING ON CLINICAL CONDITION.   Rolen Conger M.D on 02/19/2017 at 12:53 PM  Between 7am to 6pm - Pager - [phone]  After 6pm go to [email or web address] - password [email or web address]  Office  [phone]  CC: Primary care physician; Idelle Crouch, MD

## 2017-02-19 NOTE — Progress Notes (Signed)
Pt discharged to [facility] via EMS. VSS no complaints. Report called to receiving nurse at [facility]. Heart monitor and IV removed.

## 2017-02-19 NOTE — Consult Note (Signed)
[facility] Consult   Reason for Consult:  Follow-up consult 56 year old man with a history of schizoaffective disorder and multiple medical problems currently in the hospital with complications of his cancer Referring Physician:  Tressia Miners Patient Identification: Lawrence Johnston MRN:  [id number] Principal Diagnosis: Schizophrenia [facility]) Diagnosis:   Patient Active Problem List   Diagnosis Date Noted  . Antineoplastic chemotherapy induced pancytopenia ([place]) [D61.810, T45.1X5A]   . Symptomatic anemia [D64.9] 02/10/2017  . 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 Schizophrenia ([place]) [F20.9] 06/19/2013    Total Time spent with patient: 30 minutes  Subjective:   Lawrence Johnston is a 56 y.o. male patient admitted with "I'm feeling better".  HPI:  This is a follow-up note for this patient who has been in the hospital for many days now. Patient seen this afternoon chart reviewed. He tells me he is feeling much better. He is eating a little more. He feels stronger. As far as his mental health his mood is feeling good. He focuses on positive things in his life to keep him going. Denies any suicidal thoughts. Also denies having any recent hallucinations. He seems to be tolerating medications without much difficulty. His tremor is under good control. He is looking forward to discharge when the plan comes through.  Past Psychiatric History: long history of schizoaffective disorder. Has been on multiple medicines has had several prior hospitalizations. Most recently has been fairly stable with his outpatient treatment with combinations of antipsychotics and mood stabilizers  Risk to Self: Is patient at risk for suicide?: No Risk to Others:   Prior Inpatient  Therapy:   Prior Outpatient Therapy:    Past Medical History:  Past Medical History:  Diagnosis Date  . Arrhythmia    Atrial Fibrillation; Atrial Flutter w/RVR; SVT  . Depression   . Diabetes ([place])   . Dysrhythmia   . GERD (gastroesophageal reflux disease)   . Hypercholesteremia   . Hypertension   . Schizophrenia ([place])   . Sleep apnea   . Tremor     Past Surgical History:  Procedure Laterality Date  . CARDIOVERSION     x2, a-fib  . COLONOSCOPY WITH PROPOFOL N/A 02/25/2015   Procedure: COLONOSCOPY WITH PROPOFOL;  Surgeon: Josefine Class, MD;  Location: [facility] ENDOSCOPY;  Service: Endoscopy;  Laterality: N/A;  . COLONOSCOPY WITH PROPOFOL N/A 04/12/2015   Procedure: COLONOSCOPY WITH PROPOFOL;  Surgeon: Josefine Class, MD;  Location[facility] ENDOSCOPY;  Service: Endoscopy;  Laterality: N/A;   Family History: History reviewed. No pertinent family history. Family Psychiatric  History: one identified Social History:  History  Alcohol Use No     History  Drug Use No    Social History   Social History  . Marital status: Single    Spouse name: N/A  . Number of children: N/A  . Years of education: N/A   Social History Main Topics  . Smoking status: Never Smoker  . Smokeless tobacco: Never Used  . Alcohol use No  . Drug use: No  . Sexual activity: Not Asked   Other Topics Concern  . None   Social History Narrative  . None   Additional Social History:    Allergies:  No Known Allergies  Labs:  Results for orders placed or performed during the hospital encounter of 02/10/17 (from the past  48 hour(s))  Glucose, capillary     Status: Abnormal   Collection Time: 02/17/17  4:46 PM  Result Value Ref Range   Glucose-Capillary 104 (H) 65 - 99 mg/dL  Glucose, capillary     Status: None   Collection Time: 02/17/17  9:09 PM  Result Value Ref Range   Glucose-Capillary 99 65 - 99 mg/dL   Comment 1 Notify RN   Glucose, capillary     Status: Abnormal   Collection  Time: 02/18/17  7:52 AM  Result Value Ref Range   Glucose-Capillary 104 (H) 65 - 99 mg/dL  Glucose, capillary     Status: Abnormal   Collection Time: 02/18/17 11:41 AM  Result Value Ref Range   Glucose-Capillary 141 (H) 65 - 99 mg/dL  Glucose, capillary     Status: Abnormal   Collection Time: 02/18/17  4:34 PM  Result Value Ref Range   Glucose-Capillary 125 (H) 65 - 99 mg/dL  Glucose, capillary     Status: Abnormal   Collection Time: 02/18/17  8:06 PM  Result Value Ref Range   Glucose-Capillary 104 (H) 65 - 99 mg/dL  CBC     Status: Abnormal   Collection Time: 02/19/17  4:55 AM  Result Value Ref Range   WBC 0.9 (LL) 3.8 - 10.6 K/uL    Comment: CRITICAL VALUE NOTED.  VALUE IS CONSISTENT WITH PREVIOUSLY REPORTED AND CALLED VALUE.   RBC 2.52 (L) 4.40 - 5.90 MIL/uL   Hemoglobin 7.9 (L) 13.0 - 18.0 g/dL   HCT 24.1 (L) 40.0 - 52.0 %   MCV 95.7 80.0 - 100.0 fL   MCH 31.2 26.0 - 34.0 pg   MCHC 32.6 32.0 - 36.0 g/dL   RDW 18.7 (H) 11.5 - 14.5 %   Platelets 45 (L) 150 - 440 K/uL  Basic metabolic panel     Status: Abnormal   Collection Time: 02/19/17  4:55 AM  Result Value Ref Range   Sodium 141 135 - 145 mmol/L   Potassium 3.3 (L) 3.5 - 5.1 mmol/L   Chloride 112 (H) 101 - 111 mmol/L   CO2 24 22 - 32 mmol/L   Glucose, Bld 111 (H) 65 - 99 mg/dL   BUN 8 6 - 20 mg/dL   Creatinine, Ser 0.97 0.61 - 1.24 mg/dL   Calcium 8.0 (L) 8.9 - 10.3 mg/dL   GFR calc non Af Amer >60 >60 mL/min   GFR calc Af Amer >60 >60 mL/min    Comment: (NOTE) The eGFR has been calculated using the CKD EPI equation. This calculation has not been validated in all clinical situations. eGFR's persistently <60 mL/min signify possible Chronic Kidney Disease.    Anion gap 5 5 - 15  Glucose, capillary     Status: None   Collection Time: 02/19/17  8:13 AM  Result Value Ref Range   Glucose-Capillary 98 65 - 99 mg/dL   Comment 1 Notify RN   Glucose, capillary     Status: Abnormal   Collection Time: 02/19/17 11:37 AM   Result Value Ref Range   Glucose-Capillary 134 (H) 65 - 99 mg/dL    Current Facility-Administered Medications  Medication Dose Route Frequency Provider Last Rate Last Dose  . acetaminophen (TYLENOL) tablet 650 mg  650 mg Oral Q6H PRN Harrie Foreman, MD   650 mg at 02/16/17 1123   Or  . acetaminophen (TYLENOL) suppository 650 mg  650 mg Rectal Q6H PRN Harrie Foreman, MD      . amiodarone (  PACERONE) tablet 200 mg  200 mg Oral BID Teodoro Spray, MD   200 mg at 02/19/17 0941  . benztropine (COGENTIN) tablet 0.5 mg  0.5 mg Oral BID Gladstone Lighter, MD   0.5 mg at 02/19/17 0942  . bisacodyl (DULCOLAX) suppository 10 mg  10 mg Rectal Daily Gladstone Lighter, MD   10 mg at 02/18/17 0948  . cephALEXin (KEFLEX) capsule 250 mg  250 mg Oral Q12H Epifanio Lesches, MD   250 mg at 02/19/17 0942  . clonazePAM (KLONOPIN) tablet 0.5 mg  0.5 mg Oral BID Zia Kanner, Madie Reno, MD   0.5 mg at 02/19/17 0941  . divalproex (DEPAKOTE ER) 24 hr tablet 2,000 mg  2,000 mg Oral QHS Gladstone Lighter, MD   2,000 mg at 02/18/17 2200  . FLUoxetine (PROZAC) capsule 20 mg  20 mg Oral Daily Harrie Foreman, MD   20 mg at 02/19/17 0941  . gabapentin (NEURONTIN) capsule 300 mg  300 mg Oral QHS Harrie Foreman, MD   300 mg at 02/18/17 2200  . insulin aspart (novoLOG) injection 0-5 Units  0-5 Units Subcutaneous QHS Harrie Foreman, MD      . insulin aspart (novoLOG) injection 0-9 Units  0-9 Units Subcutaneous TID WC Harrie Foreman, MD   1 Units at 02/19/17 1210  . multivitamins with iron tablet 1 tablet  1 tablet Oral Daily Harrie Foreman, MD   1 tablet at 02/19/17 0941  . OLANZapine (ZYPREXA) tablet 10 mg  10 mg Oral QHS Guido Comp T, MD   10 mg at 02/18/17 2200  . ondansetron (ZOFRAN) tablet 4 mg  4 mg Oral Q6H PRN Harrie Foreman, MD       Or  . ondansetron [facility]) injection 4 mg  4 mg Intravenous Q6H PRN Harrie Foreman, MD      . pantoprazole (PROTONIX) EC tablet 40 mg  40 mg Oral  Daily Harrie Foreman, MD   40 mg at 02/19/17 0941  . polyethylene glycol (MIRALAX / GLYCOLAX) packet 17 g  17 g Oral Daily Harrie Foreman, MD   17 g at 02/18/17 0953  . potassium chloride SA (K-DUR,KLOR-CON) CR tablet 40 mEq  40 mEq Oral Once Gladstone Lighter, MD      . QUEtiapine (SEROQUEL) tablet 100 mg  100 mg Oral QHS Epifanio Lesches, MD   100 mg at 02/18/17 2200  . rOPINIRole (REQUIP) tablet 2 mg  2 mg Oral QHS Harrie Foreman, MD   2 mg at 02/18/17 2200  . rosuvastatin (CRESTOR) tablet 20 mg  20 mg Oral QHS Harrie Foreman, MD   20 mg at 02/18/17 2200  . senna-docusate (Senokot-S) tablet 1 tablet  1 tablet Oral BID Gladstone Lighter, MD   1 tablet at 02/19/17 0940  . tamsulosin (FLOMAX) capsule 0.4 mg  0.4 mg Oral Daily Epifanio Lesches, MD   0.4 mg at 02/19/17 0941    Musculoskeletal: Strength & Muscle Tone: decreased Gait & Station: unsteady Patient leans: N/A  Psychiatric Specialty Exam: Physical Exam  Nursing note and vitals reviewed. Constitutional: He appears well-developed and well-nourished.  HENT:  Head: Normocephalic and atraumatic.    Eyes: Pupils are equal, round, and reactive to light. Conjunctivae are normal.  Neck: Normal range of motion.  Cardiovascular: Normal heart sounds.   Respiratory: Effort normal. No respiratory distress.  GI: Soft.  Musculoskeletal: Normal range of motion.  Neurological: He is alert.  Skin: Skin is warm and dry.  Psychiatric: He has a normal mood and affect. His speech is normal. Judgment normal. He is slowed. Thought content is not paranoid. Cognition and memory are normal. He expresses no homicidal and no suicidal ideation.    Review of Systems  Constitutional: Negative.   HENT: Negative.   Eyes: Negative.   Respiratory: Negative.   Cardiovascular: Negative.   Gastrointestinal: Negative.   Musculoskeletal: Negative.   Skin: Negative.   Neurological: Negative.   Psychiatric/Behavioral: Negative for  depression, hallucinations, substance abuse and suicidal ideas. The patient is not nervous/anxious.     Blood pressure 128/79, pulse 78, temperature 98.4 F (36.9 C), temperature source Oral, resp. rate 16, height 5' 9"  (1.753 m), weight 260 lb 8 oz (118.2 kg), SpO2 97 %.Body mass index is 38.47 kg/m.  General Appearance: Casual  Eye Contact:  Good  Speech:  Slow  Volume:  Decreased  Mood:  Euthymic  Affect:  Constricted  Thought Process:  Goal Directed  Orientation:  Full (Time, Place, and Person)  Thought Content:  Logical  Suicidal Thoughts:  No  Homicidal Thoughts:  No  Memory:  Immediate;   Fair Recent;   Fair Remote;   Fair  Judgement:  Fair  Insight:  Fair  Psychomotor Activity:  Decreased  Concentration:  Concentration: Fair  Recall:  [employer] of Knowledge:  Fair  Language:  Fair  Akathisia:  No  Handed:  Right  AIMS (if indicated):     Assets:  Desire for Improvement Housing Resilience  ADL's:  Impaired  Cognition:  Impaired,  Mild  Sleep:        Treatment Plan Summary: Medication management and Plan 56 year old man with chronic mental health problems now suffering from cancer who has had a bit of a difficult hospital stay but seems to be getting much better. Mentally he appears to be doing quite well. He is able to discuss his mood calmly and is denying any suicidal thoughts. He is not showing active delusions or hallucinations. He seems to be tolerating his medicine well. My recommendation would be to not change anything at this point. He is on modest doses of 2 different antipsychotics but in his condition of having been through multiple medications and having ferrying needs that medicines are indicated for I think this is d probably leave as it is. His ammonia level was low and it doesn't look like his liver problems are progressing. The last Depakote level was 52 which is perfectly fine. I would not think it's worth the risk to stop but at this point. Patient  has outpatient care in the community when he is discharged. He is agreeable to the plan. If needed over the weekend please call the psychiatrist on call otherwise if he is still here after the weekend I will check in.  Disposition: Patient does not meet criteria for psychiatric inpatient admission. Supportive therapy provided about ongoing stressors.  Alethia Berthold, MD 02/19/2017 2:20 PM

## 2017-02-20 NOTE — Progress Notes (Deleted)
[place]  [phone]) [phone] [phone]) [phone]  ID: Jackelyn Hoehn OB: 1960/10/15  MR#: [id number]  FRT#:[id number]  Patient Care Team: Idelle Crouch, MD as PCP - General (Internal Medicine)  CHIEF COMPLAINT: CML  INTERVAL HISTORY: Patient returns to clinic today as an add-on after presenting to his primary care physician with possible side effects to [place]. His caretaker reports that after starting Rivanna he was able to continue conversation and mentating well, but had no control over his body and would slumped or fall over without warning. He also had difficulty walking and performing other simple tasks over this time frame. His symptoms resolved one day after discontinuing Gleevec. Currently he feels well and is back to his baseline. He does not complain of fevers, night sweats, or weight loss. He denies any chest pain or shortness of breath. He has no nausea, vomiting, constipation, or diarrhea. He has no urinary complaints. Patient offers no specific complaints today.  REVIEW OF SYSTEMS:   Review of Systems  Constitutional: Negative.  Negative for chills, diaphoresis, fever, malaise/fatigue and weight loss.  HENT: Negative for congestion.   Respiratory: Negative.  Negative for cough and shortness of breath.   Cardiovascular: Negative.  Negative for chest pain and leg swelling.  Gastrointestinal: Negative.  Negative for abdominal pain.  Genitourinary: Negative.   Musculoskeletal: Negative.   Skin: Negative.  Negative for rash.  Neurological: Negative.  Negative for sensory change and weakness.  Endo/Heme/Allergies: Negative.  Does not bruise/bleed easily.  Psychiatric/Behavioral: Negative.  Negative for depression. The patient is not nervous/anxious.     As per HPI. Otherwise, a complete review of systems is negative.  PAST MEDICAL HISTORY: Past Medical History:  Diagnosis Date  . Arrhythmia    Atrial Fibrillation; Atrial Flutter w/RVR; SVT  .  Depression   . Diabetes ([place])   . Dysrhythmia   . GERD (gastroesophageal reflux disease)   . Hypercholesteremia   . Hypertension   . Schizophrenia ([place])   . Sleep apnea   . Tremor     PAST SURGICAL HISTORY: Past Surgical History:  Procedure Laterality Date  . CARDIOVERSION     x2, a-fib  . COLONOSCOPY WITH PROPOFOL N/A 02/25/2015   Procedure: COLONOSCOPY WITH PROPOFOL;  Surgeon: Josefine Class, MD;  Location: [facility] ENDOSCOPY;  Service: Endoscopy;  Laterality: N/A;  . COLONOSCOPY WITH PROPOFOL N/A 04/12/2015   Procedure: COLONOSCOPY WITH PROPOFOL;  Surgeon: Josefine Class, MD;  Location[facility] ENDOSCOPY;  Service: Endoscopy;  Laterality: N/A;    FAMILY HISTORY: Reviewed and unchanged. No reported history of malignancy or chronic disease.  ADVANCED DIRECTIVES (Y/N):  N  HEALTH MAINTENANCE: Social History  Substance Use Topics  . Smoking status: Never Smoker  . Smokeless tobacco: Never Used  . Alcohol use No     Colonoscopy:  PAP:  Bone density:  Lipid panel:  No Known Allergies  Current Outpatient Prescriptions  Medication Sig Dispense Refill  . amiodarone (PACERONE) 200 MG tablet Take 1 tablet (200 mg total) by mouth 2 (two) times daily. X 1 week and then decrease to once a day 40 tablet 0  . benztropine (COGENTIN) 0.5 MG tablet Take 0.5 mg by mouth 2 (two) times daily.    . clonazePAM (KLONOPIN) 0.5 MG tablet Take 1 tablet (0.5 mg total) by mouth 2 (two) times daily. 30 tablet 0  . divalproex (DEPAKOTE ER) 500 MG 24 hr tablet Take 2,000 mg by mouth at bedtime.    Marland Kitchen FLUoxetine (PROZAC) 20 MG capsule  Take 20 mg by mouth daily.    Marland Kitchen gabapentin (NEURONTIN) 300 MG capsule Take 300 mg by mouth at bedtime.     . metFORMIN (GLUCOPHAGE) 500 MG tablet Take 500 mg by mouth 2 (two) times daily with a meal.    . metoprolol succinate (TOPROL-XL) 25 MG 24 hr tablet Take 25 mg by mouth 2 (two) times daily.     . Multiple Vitamins-Iron (MULTIVITAMINS WITH IRON) TABS tablet  Take 1 tablet by mouth daily.    Marland Kitchen OLANZapine (ZYPREXA) 10 MG tablet Take 1 tablet (10 mg total) by mouth at bedtime. 30 tablet 2  . pantoprazole (PROTONIX) 40 MG tablet Take 40 mg by mouth daily.    . polyethylene glycol (MIRALAX / GLYCOLAX) packet Take 17 g by mouth daily.    . QUEtiapine (SEROQUEL) 100 MG tablet Take 1 tablet (100 mg total) by mouth at bedtime. 30 tablet 2  . rOPINIRole (REQUIP) 2 MG tablet Take 2 mg by mouth at bedtime.    . rosuvastatin (CRESTOR) 20 MG tablet Take 20 mg by mouth at bedtime.     . senna-docusate (SENOKOT-S) 8.6-50 MG tablet Take 1 tablet by mouth 2 (two) times daily.     . tamsulosin (FLOMAX) 0.4 MG CAPS capsule Take 1 capsule (0.4 mg total) by mouth daily. 30 capsule 2   No current facility-administered medications for this visit.     OBJECTIVE: There were no vitals filed for this visit.   There is no height or weight on file to calculate BMI.    ECOG FS:0 - Asymptomatic  General: Well-developed, well-nourished, no acute distress. Eyes: Pink conjunctiva, anicteric sclera. Lungs: Clear to auscultation bilaterally. Heart: Regular rate and rhythm. No rubs, murmurs, or gallops. Abdomen: Soft, nontender, nondistended. No organomegaly noted, normoactive bowel sounds. Musculoskeletal: No edema, cyanosis, or clubbing. Neuro: Alert, answering all questions appropriately. Cranial nerves grossly intact. Skin: No rashes or petechiae noted. Psych: Normal affect.  LAB RESULTS:  Lab Results  Component Value Date   NA 141 02/19/2017   K 3.3 (L) 02/19/2017   CL 112 (H) 02/19/2017   CO2 24 02/19/2017   GLUCOSE 111 (H) 02/19/2017   BUN 8 02/19/2017   CREATININE 0.97 02/19/2017   CALCIUM 8.0 (L) 02/19/2017   PROT 5.2 (L) 02/10/2017   ALBUMIN 2.7 (L) 02/10/2017   AST 89 (H) 02/10/2017   ALT 20 02/10/2017   ALKPHOS 50 02/10/2017   BILITOT 1.2 02/10/2017   GFRNONAA >60 02/19/2017   GFRAA >60 02/19/2017    Lab Results  Component Value Date   WBC 0.9  (LL) 02/19/2017   NEUTROABS 0.8 (L) 02/15/2017   HGB 7.9 (L) 02/19/2017   HCT 24.1 (L) 02/19/2017   MCV 95.7 02/19/2017   PLT 45 (L) 02/19/2017     STUDIES: Ct Abdomen Pelvis Wo Contrast  Result Date: 02/10/2017 CLINICAL DATA:  Acute onset of dizziness. Status post fall in shower, with generalized weakness. Hypotension. Initial encounter. EXAM: CT ABDOMEN AND PELVIS WITHOUT CONTRAST TECHNIQUE: Multidetector CT imaging of the abdomen and pelvis was performed following the standard protocol without IV contrast. COMPARISON:  None. FINDINGS: Lower chest: Mild bibasilar atelectasis is noted. The visualized portions of the mediastinum are unremarkable. Hepatobiliary: The liver is grossly unremarkable in appearance. The gallbladder is grossly unremarkable. The common bile duct remains normal in caliber. Pancreas: The pancreas is within normal limits. Spleen: The spleen is enlarged, measuring 17.7 cm in length. Adrenals/Urinary Tract: The adrenal glands are grossly unremarkable appearance. The kidneys  are grossly unremarkable in appearance. There is no evidence of hydronephrosis. No renal or ureteral stones are identified. Nonspecific perinephric stranding is noted bilaterally. Stomach/Bowel: The stomach is unremarkable in appearance. The small bowel is within normal limits. The appendix is not visualized; there is no evidence for appendicitis. The colon is unremarkable in appearance. Vascular/Lymphatic: The abdominal aorta is unremarkable in appearance. The inferior vena cava is grossly unremarkable. Visualized retroperitoneal nodes remain normal in size. No pelvic sidewall lymphadenopathy is identified. Reproductive: The bladder is mildly distended and grossly unremarkable. The prostate remains normal in size, with scattered calcifications. Other: Small to moderate volume ascites is noted within the abdomen and pelvis. Soft tissue edema along the abdominal wall raises concern for mild anasarca.  Musculoskeletal: No acute osseous abnormalities are identified. Vacuum phenomenon is noted at L1-L2. Chronic bilateral pars defects are seen at L5, without evidence of anterolisthesis. The visualized musculature is unremarkable in appearance. IMPRESSION: 1. No acute abnormality seen to explain the patient's symptoms. 2. Small to moderate volume ascites noted within the abdomen and pelvis. 3. Splenomegaly. 4. Soft tissue edema along the abdominal wall raises concern for mild anasarca. 5. Mild bibasilar atelectasis. 6. Chronic bilateral pars defects at L5, without evidence of anterolisthesis. Electronically Signed   By: Garald Balding M.D.   On: 02/10/2017 05:01   Dg Abd 1 View  Result Date: 02/14/2017 CLINICAL DATA:  Abdominal distension. EXAM: ABDOMEN - 1 VIEW COMPARISON:  Radiographs of February 13, 2017. FINDINGS: There is significantly decreased bowel distention compared to prior exam, consistent with improving ileus. Gas is noted in the rectum. No radio-opaque calculi or other significant radiographic abnormality are seen. IMPRESSION: Significantly decreased bowel distention compared to prior exam, consistent with improving ileus. Electronically Signed   By: Marijo Conception, M.D.   On: 02/14/2017 08:32   Ct Head Wo Contrast  Result Date: 02/10/2017 CLINICAL DATA:  Acute onset of dizziness after fall. Generalized weakness. Initial encounter. EXAM: CT HEAD WITHOUT CONTRAST TECHNIQUE: Contiguous axial images were obtained from the base of the skull through the vertex without intravenous contrast. COMPARISON:  CT of the head performed 08/19/2015 FINDINGS: Brain: No evidence of acute infarction, hemorrhage, hydrocephalus, extra-axial collection or mass lesion/mass effect. Prominence of the ventricles and sulci reflects mild cortical volume loss. Mild cerebellar atrophy is noted. The brainstem and fourth ventricle are within normal limits. The basal ganglia are unremarkable in appearance. The cerebral  hemispheres demonstrate grossly normal gray-white differentiation. No mass effect or midline shift is seen. Vascular: No hyperdense vessel or unexpected calcification. Skull: There is no evidence of fracture; visualized osseous structures are unremarkable in appearance. Sinuses/Orbits: The visualized portions of the orbits are within normal limits. The paranasal sinuses and mastoid air cells are well-aerated. Other: No significant soft tissue abnormalities are seen. IMPRESSION: 1. No evidence of traumatic intracranial injury or fracture. 2. Mild cortical volume loss noted. Electronically Signed   By: Garald Balding M.D.   On: 02/10/2017 03:15   US Venous Img Upper Uni Left  Result Date: 02/16/2017 CLINICAL DATA:  Left upper extremity pain and redness EXAM: LEFT UPPER EXTREMITY VENOUS DOPPLER ULTRASOUND TECHNIQUE: Gray-scale sonography with graded compression, as well as color Doppler and duplex ultrasound were performed to evaluate the upper extremity deep venous system from the level of the subclavian vein and including the jugular, axillary, basilic, radial, ulnar and upper cephalic vein. Spectral Doppler was utilized to evaluate flow at rest and with distal augmentation maneuvers. COMPARISON:  None. FINDINGS: Contralateral Subclavian  Vein: Respiratory phasicity is normal and symmetric with the symptomatic side. No evidence of thrombus. Normal compressibility. Internal Jugular Vein: No evidence of thrombus. Normal compressibility, respiratory phasicity and response to augmentation. Subclavian Vein: No evidence of thrombus. Normal compressibility, respiratory phasicity and response to augmentation. Axillary Vein: No evidence of thrombus. Normal compressibility, respiratory phasicity and response to augmentation. Cephalic Vein: Occlusive thrombus in the mid to distal segments. Basilic Vein: No evidence of thrombus. Normal compressibility, respiratory phasicity and response to augmentation. Brachial Veins: No  evidence of thrombus. Normal compressibility, respiratory phasicity and response to augmentation. Radial Veins: No evidence of thrombus. Normal compressibility, respiratory phasicity and response to augmentation. Ulnar Veins: No evidence of thrombus. Normal compressibility, respiratory phasicity and response to augmentation. Venous Reflux:  None visualized. Other Findings:  None visualized. IMPRESSION: 1. No evidence of DVT within the left upper extremity. 2. Occlusive thrombus in the mid to distal cephalic vein. Electronically Signed   By: Titus Dubin M.D.   On: 02/16/2017 16:00   Dg Chest Port 1 View  Result Date: 02/13/2017 CLINICAL DATA:  NG tube placement. EXAM: PORTABLE CHEST 1 VIEW COMPARISON:  02/13/2017 FINDINGS: Catheter projects over the mid thorax, tip is not visualized. Cardiomediastinal silhouette is normal for portable technique. Mediastinal contours appear intact. There is no evidence of focal airspace consolidation, pleural effusion or pneumothorax. Low lung volumes Osseous structures are without acute abnormality. Soft tissues are grossly normal. IMPRESSION: The tip of the enteric catheter is not visualized. Low lung volumes. Electronically Signed   By: Fidela Salisbury M.D.   On: 02/13/2017 15:15   Dg Chest Port 1 View  Result Date: 02/13/2017 CLINICAL DATA:  Dizziness, GI bleeding and hypotension. EXAM: PORTABLE CHEST 1 VIEW COMPARISON:  10/27/2011 FINDINGS: The heart size and mediastinal contours are within normal limits. Lung volumes are low bilaterally. There is elevation of the right hemidiaphragm. Bibasilar scarring and atelectasis present. There is no evidence of pulmonary edema, consolidation, pneumothorax, nodule or pleural fluid. The visualized skeletal structures are unremarkable. IMPRESSION: Low lung volumes with bibasilar scarring/ atelectasis. Electronically Signed   By: Aletta Edouard M.D.   On: 02/13/2017 13:57   Dg Abd Portable 1v  Result Date:  02/13/2017 CLINICAL DATA:  Abdominal distention.  GI bleed and hypotension. EXAM: PORTABLE ABDOMEN - 1 VIEW COMPARISON:  CT of the abdomen and pelvis on 02/10/2017 FINDINGS: Increased gaseous distention of bowel since the recent CT study, especially of the colon. Some gas is also present in small bowel. Findings are consistent with ileus. No gross free air or pneumatosis identified. IMPRESSION: Increase gaseous distention of primarily the colon is consistent with ileus. Electronically Signed   By: Aletta Edouard M.D.   On: 02/13/2017 13:55    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 BCR-ABL is now increasing. Given patient's persistent pancytopenia, Tasigna was discontinued. Okay to discontinue allopurinol. Although patient's symptoms occurred when starting Gleevec, these are not typical side effects directly related to treatment. It is possible they are secondary to a drug interaction with one of the multiple medications that he is taking (Prozac, Seroquel, Ingrezza, and/or Flupenazine all possibilities.)  Although his symptoms resolved 24 hours after discontinuing, which does not make sense biochemically. Will rechallenge with Gleevec in the next week. If his symptoms recur will discontinue altogether. If patient tolerates Gleevec he will return to clinic in 6 weeks for further evaluation. If Gleevec is discontinued, will initiate third line treatment.  2. Pancytopenia: Significantly  improved.  Discontinue Tasigna as above.  3. Renal insufficiency: Patient's creatinine is mildly elevated today.  Approximately 30 minutes was spent in discussion of which 50% was consultation.   Patient expressed understanding and was in agreement with this plan. He also understands that He can call clinic at any time with any questions, concerns, or complaints.    Lloyd Huger, MD 02/20/17 8:29 AM

## 2017-02-22 ENCOUNTER — Inpatient Hospital Stay: Payer: No Typology Code available for payment source | Admitting: Oncology

## 2017-02-22 ENCOUNTER — Inpatient Hospital Stay: Payer: No Typology Code available for payment source | Attending: Oncology

## 2017-02-22 DIAGNOSIS — I4891 Unspecified atrial fibrillation: Secondary | ICD-10-CM | POA: Insufficient documentation

## 2017-02-22 DIAGNOSIS — I10 Essential (primary) hypertension: Secondary | ICD-10-CM | POA: Insufficient documentation

## 2017-02-22 DIAGNOSIS — K922 Gastrointestinal hemorrhage, unspecified: Secondary | ICD-10-CM | POA: Insufficient documentation

## 2017-02-22 DIAGNOSIS — R188 Other ascites: Secondary | ICD-10-CM | POA: Insufficient documentation

## 2017-02-22 DIAGNOSIS — F329 Major depressive disorder, single episode, unspecified: Secondary | ICD-10-CM | POA: Insufficient documentation

## 2017-02-22 DIAGNOSIS — Z79899 Other long term (current) drug therapy: Secondary | ICD-10-CM | POA: Insufficient documentation

## 2017-02-22 DIAGNOSIS — R161 Splenomegaly, not elsewhere classified: Secondary | ICD-10-CM | POA: Insufficient documentation

## 2017-02-22 DIAGNOSIS — R251 Tremor, unspecified: Secondary | ICD-10-CM | POA: Insufficient documentation

## 2017-02-22 DIAGNOSIS — I82619 Acute embolism and thrombosis of superficial veins of unspecified upper extremity: Secondary | ICD-10-CM | POA: Insufficient documentation

## 2017-02-22 DIAGNOSIS — Z7984 Long term (current) use of oral hypoglycemic drugs: Secondary | ICD-10-CM | POA: Insufficient documentation

## 2017-02-22 DIAGNOSIS — K219 Gastro-esophageal reflux disease without esophagitis: Secondary | ICD-10-CM | POA: Insufficient documentation

## 2017-02-22 DIAGNOSIS — N289 Disorder of kidney and ureter, unspecified: Secondary | ICD-10-CM | POA: Insufficient documentation

## 2017-02-22 DIAGNOSIS — G4730 Sleep apnea, unspecified: Secondary | ICD-10-CM | POA: Insufficient documentation

## 2017-02-22 DIAGNOSIS — E7800 Pure hypercholesterolemia, unspecified: Secondary | ICD-10-CM | POA: Insufficient documentation

## 2017-02-22 DIAGNOSIS — D61818 Other pancytopenia: Secondary | ICD-10-CM | POA: Insufficient documentation

## 2017-02-22 DIAGNOSIS — F209 Schizophrenia, unspecified: Secondary | ICD-10-CM | POA: Insufficient documentation

## 2017-02-22 DIAGNOSIS — I471 Supraventricular tachycardia: Secondary | ICD-10-CM | POA: Insufficient documentation

## 2017-02-22 DIAGNOSIS — E119 Type 2 diabetes mellitus without complications: Secondary | ICD-10-CM | POA: Insufficient documentation

## 2017-02-22 DIAGNOSIS — R531 Weakness: Secondary | ICD-10-CM | POA: Insufficient documentation

## 2017-02-22 DIAGNOSIS — R42 Dizziness and giddiness: Secondary | ICD-10-CM | POA: Insufficient documentation

## 2017-02-22 DIAGNOSIS — C9210 Chronic myeloid leukemia, BCR/ABL-positive, not having achieved remission: Secondary | ICD-10-CM | POA: Insufficient documentation

## 2017-02-23 NOTE — Progress Notes (Signed)
[place]  [phone]) [phone] [phone]) [phone]  ID: Lawrence Johnston OB: [birth year]  MR#: [id number]  WPY#:[id number]  Patient Care Team: Idelle Crouch, MD as PCP - General (Internal Medicine)  CHIEF COMPLAINT: CML  INTERVAL HISTORY: Patient returns to clinic today for repeat laboratory work, hospital follow-up, and consideration of reinitiating [place]. He currently feels well and is at his baseline. He has a good appetite and is gaining weight. He does not complain of fevers, night sweats, or weight loss. He denies any chest pain or shortness of breath. He has no nausea, vomiting, constipation, or diarrhea. He has no urinary complaints. Patient offers no specific complaints today.  REVIEW OF SYSTEMS:   Review of Systems  Constitutional: Negative.  Negative for chills, diaphoresis, fever, malaise/fatigue and weight loss.  HENT: Negative for congestion.   Respiratory: Negative.  Negative for cough and shortness of breath.   Cardiovascular: Negative.  Negative for chest pain and leg swelling.  Gastrointestinal: Negative.  Negative for abdominal pain.  Genitourinary: Negative.   Musculoskeletal: Negative.   Skin: Negative.  Negative for rash.  Neurological: Negative.  Negative for sensory change and weakness.  Endo/Heme/Allergies: Negative.  Does not bruise/bleed easily.  Psychiatric/Behavioral: Negative.  Negative for depression. The patient is not nervous/anxious.     As per HPI. Otherwise, a complete review of systems is negative.  PAST MEDICAL HISTORY: Past Medical History:  Diagnosis Date  . Arrhythmia    Atrial Fibrillation; Atrial Flutter w/RVR; SVT  . Depression   . Diabetes ([place])   . Dysrhythmia   . GERD (gastroesophageal reflux disease)   . Hypercholesteremia   . Hypertension   . Schizophrenia ([place])   . Sleep apnea   . Tremor     PAST SURGICAL HISTORY: Past Surgical History:  Procedure Laterality Date  . CARDIOVERSION     x2,  a-fib  . COLONOSCOPY WITH PROPOFOL N/A 02/25/2015   Procedure: COLONOSCOPY WITH PROPOFOL;  Surgeon: Josefine Class, MD;  Location: [facility] ENDOSCOPY;  Service: Endoscopy;  Laterality: N/A;  . COLONOSCOPY WITH PROPOFOL N/A 04/12/2015   Procedure: COLONOSCOPY WITH PROPOFOL;  Surgeon: Josefine Class, MD;  Location: [facility] ENDOSCOPY;  Service: Endoscopy;  Laterality: N/A;    FAMILY HISTORY: Reviewed and unchanged. No reported history of malignancy or chronic disease.  ADVANCED DIRECTIVES (Y/N):  N  HEALTH MAINTENANCE: Social History  Substance Use Topics  . Smoking status: Never Smoker  . Smokeless tobacco: Never Used  . Alcohol use No     Colonoscopy:  PAP:  Bone density:  Lipid panel:  No Known Allergies  Current Outpatient Prescriptions  Medication Sig Dispense Refill  . amiodarone (PACERONE) 200 MG tablet Take 1 tablet (200 mg total) by mouth 2 (two) times daily. X 1 week and then decrease to once a day 40 tablet 0  . benztropine (COGENTIN) 0.5 MG tablet Take 0.5 mg by mouth 2 (two) times daily.    . clonazePAM (KLONOPIN) 0.5 MG tablet Take 1 tablet (0.5 mg total) by mouth 2 (two) times daily. 30 tablet 0  . divalproex (DEPAKOTE ER) 500 MG 24 hr tablet Take 2,000 mg by mouth at bedtime.    Marland Kitchen FLUoxetine (PROZAC) 20 MG capsule Take 20 mg by mouth daily.    Marland Kitchen gabapentin (NEURONTIN) 300 MG capsule Take 300 mg by mouth at bedtime.     . metFORMIN (GLUCOPHAGE) 500 MG tablet Take 500 mg by mouth 2 (two) times daily with a meal.    . Multiple Vitamins-Iron (  MULTIVITAMINS WITH IRON) TABS tablet Take 1 tablet by mouth daily.    Marland Kitchen OLANZapine (ZYPREXA) 10 MG tablet Take 1 tablet (10 mg total) by mouth at bedtime. 30 tablet 2  . pantoprazole (PROTONIX) 40 MG tablet Take 40 mg by mouth daily.    . polyethylene glycol (MIRALAX / GLYCOLAX) packet Take 17 g by mouth daily.    . QUEtiapine (SEROQUEL) 100 MG tablet Take 1 tablet (100 mg total) by mouth at bedtime. 30 tablet 2  . rOPINIRole  (REQUIP) 2 MG tablet Take 2 mg by mouth at bedtime.    . rosuvastatin (CRESTOR) 20 MG tablet Take 20 mg by mouth at bedtime.     . senna-docusate (SENOKOT-S) 8.6-50 MG tablet Take 1 tablet by mouth 2 (two) times daily.     . tamsulosin (FLOMAX) 0.4 MG CAPS capsule Take 1 capsule (0.4 mg total) by mouth daily. 30 capsule 2  . imatinib (GLEEVEC) 100 MG tablet Take 1 tablet (100 mg total) by mouth daily. Take with meals and large glass of water.Caution:Chemotherapy 30 tablet 2  . metoprolol succinate (TOPROL-XL) 25 MG 24 hr tablet Take 25 mg by mouth 2 (two) times daily.      No current facility-administered medications for this visit.     OBJECTIVE: Vitals:   02/24/17 0936  BP: 126/79  Pulse: 90  Resp: 18  Temp: 98.7 F (37.1 C)     Body mass index is 37.72 kg/m.    ECOG FS:0 - Asymptomatic  General: Well-developed, well-nourished, no acute distress. Eyes: Pink conjunctiva, anicteric sclera. Lungs: Clear to auscultation bilaterally. Heart: Regular rate and rhythm. No rubs, murmurs, or gallops. Abdomen: Soft, nontender, nondistended. No organomegaly noted, normoactive bowel sounds. Musculoskeletal: No edema, cyanosis, or clubbing. Neuro: Alert, answering all questions appropriately. Cranial nerves grossly intact. Skin: No rashes or petechiae noted. Psych: Normal affect.  LAB RESULTS:  Lab Results  Component Value Date   NA 138 02/24/2017   K 3.4 (L) 02/24/2017   CL 108 02/24/2017   CO2 23 02/24/2017   GLUCOSE 186 (H) 02/24/2017   BUN 6 02/24/2017   CREATININE 1.26 (H) 02/24/2017   CALCIUM 8.3 (L) 02/24/2017   PROT 5.9 (L) 02/24/2017   ALBUMIN 2.6 (L) 02/24/2017   AST 25 02/24/2017   ALT 14 (L) 02/24/2017   ALKPHOS 85 02/24/2017   BILITOT 0.5 02/24/2017   GFRNONAA >60 02/24/2017   GFRAA >60 02/24/2017    Lab Results  Component Value Date   WBC 2.5 (L) 02/24/2017   NEUTROABS 1.7 02/24/2017   HGB 8.9 (L) 02/24/2017   HCT 27.2 (L) 02/24/2017   MCV 95.4 02/24/2017     PLT 76 (L) 02/24/2017     STUDIES: Ct Abdomen Pelvis Wo Contrast  Result Date: 02/10/2017 CLINICAL DATA:  Acute onset of dizziness. Status post fall in shower, with generalized weakness. Hypotension. Initial encounter. EXAM: CT ABDOMEN AND PELVIS WITHOUT CONTRAST TECHNIQUE: Multidetector CT imaging of the abdomen and pelvis was performed following the standard protocol without IV contrast. COMPARISON:  None. FINDINGS: Lower chest: Mild bibasilar atelectasis is noted. The visualized portions of the mediastinum are unremarkable. Hepatobiliary: The liver is grossly unremarkable in appearance. The gallbladder is grossly unremarkable. The common bile duct remains normal in caliber. Pancreas: The pancreas is within normal limits. Spleen: The spleen is enlarged, measuring 17.7 cm in length. Adrenals/Urinary Tract: The adrenal glands are grossly unremarkable appearance. The kidneys are grossly unremarkable in appearance. There is no evidence of hydronephrosis. No renal or  ureteral stones are identified. Nonspecific perinephric stranding is noted bilaterally. Stomach/Bowel: The stomach is unremarkable in appearance. The small bowel is within normal limits. The appendix is not visualized; there is no evidence for appendicitis. The colon is unremarkable in appearance. Vascular/Lymphatic: The abdominal aorta is unremarkable in appearance. The inferior vena cava is grossly unremarkable. Visualized retroperitoneal nodes remain normal in size. No pelvic sidewall lymphadenopathy is identified. Reproductive: The bladder is mildly distended and grossly unremarkable. The prostate remains normal in size, with scattered calcifications. Other: Small to moderate volume ascites is noted within the abdomen and pelvis. Soft tissue edema along the abdominal wall raises concern for mild anasarca. Musculoskeletal: No acute osseous abnormalities are identified. Vacuum phenomenon is noted at L1-L2. Chronic bilateral pars defects are  seen at L5, without evidence of anterolisthesis. The visualized musculature is unremarkable in appearance. IMPRESSION: 1. No acute abnormality seen to explain the patient's symptoms. 2. Small to moderate volume ascites noted within the abdomen and pelvis. 3. Splenomegaly. 4. Soft tissue edema along the abdominal wall raises concern for mild anasarca. 5. Mild bibasilar atelectasis. 6. Chronic bilateral pars defects at L5, without evidence of anterolisthesis. Electronically Signed   By: Garald Balding M.D.   On: 02/10/2017 05:01   Dg Abd 1 View  Result Date: 02/14/2017 CLINICAL DATA:  Abdominal distension. EXAM: ABDOMEN - 1 VIEW COMPARISON:  Radiographs of February 13, 2017. FINDINGS: There is significantly decreased bowel distention compared to prior exam, consistent with improving ileus. Gas is noted in the rectum. No radio-opaque calculi or other significant radiographic abnormality are seen. IMPRESSION: Significantly decreased bowel distention compared to prior exam, consistent with improving ileus. Electronically Signed   By: Marijo Conception, M.D.   On: 02/14/2017 08:32   Ct Head Wo Contrast  Result Date: 02/10/2017 CLINICAL DATA:  Acute onset of dizziness after fall. Generalized weakness. Initial encounter. EXAM: CT HEAD WITHOUT CONTRAST TECHNIQUE: Contiguous axial images were obtained from the base of the skull through the vertex without intravenous contrast. COMPARISON:  CT of the head performed 08/19/2015 FINDINGS: Brain: No evidence of acute infarction, hemorrhage, hydrocephalus, extra-axial collection or mass lesion/mass effect. Prominence of the ventricles and sulci reflects mild cortical volume loss. Mild cerebellar atrophy is noted. The brainstem and fourth ventricle are within normal limits. The basal ganglia are unremarkable in appearance. The cerebral hemispheres demonstrate grossly normal gray-white differentiation. No mass effect or midline shift is seen. Vascular: No hyperdense vessel or  unexpected calcification. Skull: There is no evidence of fracture; visualized osseous structures are unremarkable in appearance. Sinuses/Orbits: The visualized portions of the orbits are within normal limits. The paranasal sinuses and mastoid air cells are well-aerated. Other: No significant soft tissue abnormalities are seen. IMPRESSION: 1. No evidence of traumatic intracranial injury or fracture. 2. Mild cortical volume loss noted. Electronically Signed   By: Garald Balding M.D.   On: 02/10/2017 03:15   US Venous Img Upper Uni Left  Result Date: 02/16/2017 CLINICAL DATA:  Left upper extremity pain and redness EXAM: LEFT UPPER EXTREMITY VENOUS DOPPLER ULTRASOUND TECHNIQUE: Gray-scale sonography with graded compression, as well as color Doppler and duplex ultrasound were performed to evaluate the upper extremity deep venous system from the level of the subclavian vein and including the jugular, axillary, basilic, radial, ulnar and upper cephalic vein. Spectral Doppler was utilized to evaluate flow at rest and with distal augmentation maneuvers. COMPARISON:  None. FINDINGS: Contralateral Subclavian Vein: Respiratory phasicity is normal and symmetric with the symptomatic side. No evidence of  thrombus. Normal compressibility. Internal Jugular Vein: No evidence of thrombus. Normal compressibility, respiratory phasicity and response to augmentation. Subclavian Vein: No evidence of thrombus. Normal compressibility, respiratory phasicity and response to augmentation. Axillary Vein: No evidence of thrombus. Normal compressibility, respiratory phasicity and response to augmentation. Cephalic Vein: Occlusive thrombus in the mid to distal segments. Basilic Vein: No evidence of thrombus. Normal compressibility, respiratory phasicity and response to augmentation. Brachial Veins: No evidence of thrombus. Normal compressibility, respiratory phasicity and response to augmentation. Radial Veins: No evidence of thrombus. Normal  compressibility, respiratory phasicity and response to augmentation. Ulnar Veins: No evidence of thrombus. Normal compressibility, respiratory phasicity and response to augmentation. Venous Reflux:  None visualized. Other Findings:  None visualized. IMPRESSION: 1. No evidence of DVT within the left upper extremity. 2. Occlusive thrombus in the mid to distal cephalic vein. Electronically Signed   By: Titus Dubin M.D.   On: 02/16/2017 16:00   Dg Chest Port 1 View  Result Date: 02/13/2017 CLINICAL DATA:  NG tube placement. EXAM: PORTABLE CHEST 1 VIEW COMPARISON:  02/13/2017 FINDINGS: Catheter projects over the mid thorax, tip is not visualized. Cardiomediastinal silhouette is normal for portable technique. Mediastinal contours appear intact. There is no evidence of focal airspace consolidation, pleural effusion or pneumothorax. Low lung volumes Osseous structures are without acute abnormality. Soft tissues are grossly normal. IMPRESSION: The tip of the enteric catheter is not visualized. Low lung volumes. Electronically Signed   By: Fidela Salisbury M.D.   On: 02/13/2017 15:15   Dg Chest Port 1 View  Result Date: 02/13/2017 CLINICAL DATA:  Dizziness, GI bleeding and hypotension. EXAM: PORTABLE CHEST 1 VIEW COMPARISON:  10/27/2011 FINDINGS: The heart size and mediastinal contours are within normal limits. Lung volumes are low bilaterally. There is elevation of the right hemidiaphragm. Bibasilar scarring and atelectasis present. There is no evidence of pulmonary edema, consolidation, pneumothorax, nodule or pleural fluid. The visualized skeletal structures are unremarkable. IMPRESSION: Low lung volumes with bibasilar scarring/ atelectasis. Electronically Signed   By: Aletta Edouard M.D.   On: 02/13/2017 13:57   Dg Abd Portable 1v  Result Date: 02/13/2017 CLINICAL DATA:  Abdominal distention.  GI bleed and hypotension. EXAM: PORTABLE ABDOMEN - 1 VIEW COMPARISON:  CT of the abdomen and pelvis on  02/10/2017 FINDINGS: Increased gaseous distention of bowel since the recent CT study, especially of the colon. Some gas is also present in small bowel. Findings are consistent with ileus. No gross free air or pneumatosis identified. IMPRESSION: Increase gaseous distention of primarily the colon is consistent with ileus. Electronically Signed   By: Aletta Edouard M.D.   On: 02/13/2017 13:55    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 BCR-ABL was trending down prior to discontinuing Gleevec secondary to severe pancytopenia. Previously, Tasigna was discontinued secondary to persistent pancytopenia despite dose reducing. He still has pancytopenia, but significantly improved since his hospitalization. Because there was a positive effect with Gleevec, will reinitiate treatment at 100 mg per day and increase the dose as tolerated. Return to clinic in 2 weeks and 5 weeks for laboratory work and then in 6 weeks for further evaluation.  It is possible his pancytopenia and difficulty with treatment is secondary to a drug interaction with one of the multiple medications that he is taking (Prozac, Seroquel, Ingrezza, and/or Flupenazine all possibilities.)   2. Pancytopenia: Significantly improved.  Discontinue Tasigna as above. Gleevec 100 mg daily. 3. Renal insufficiency: Patient's creatinine is mildly elevated  today.  Approximately 30 minutes was spent in discussion of which 50% was consultation.   Patient expressed understanding and was in agreement with this plan. He also understands that He can call clinic at any time with any questions, concerns, or complaints.    Lloyd Huger, MD 02/24/17 10:12 AM

## 2017-02-24 ENCOUNTER — Telehealth: Payer: Self-pay | Admitting: Urology

## 2017-02-24 ENCOUNTER — Telehealth: Payer: Self-pay | Admitting: Pharmacist

## 2017-02-24 ENCOUNTER — Inpatient Hospital Stay: Payer: No Typology Code available for payment source

## 2017-02-24 ENCOUNTER — Inpatient Hospital Stay (HOSPITAL_BASED_OUTPATIENT_CLINIC_OR_DEPARTMENT_OTHER): Payer: No Typology Code available for payment source | Admitting: Oncology

## 2017-02-24 VITALS — BP 126/79 | HR 90 | Temp 98.7°F | Resp 18 | Wt 255.4 lb

## 2017-02-24 DIAGNOSIS — R42 Dizziness and giddiness: Secondary | ICD-10-CM | POA: Diagnosis not present

## 2017-02-24 DIAGNOSIS — D61818 Other pancytopenia: Secondary | ICD-10-CM | POA: Diagnosis not present

## 2017-02-24 DIAGNOSIS — Z7984 Long term (current) use of oral hypoglycemic drugs: Secondary | ICD-10-CM

## 2017-02-24 DIAGNOSIS — F209 Schizophrenia, unspecified: Secondary | ICD-10-CM | POA: Diagnosis not present

## 2017-02-24 DIAGNOSIS — K922 Gastrointestinal hemorrhage, unspecified: Secondary | ICD-10-CM | POA: Diagnosis not present

## 2017-02-24 DIAGNOSIS — C9210 Chronic myeloid leukemia, BCR/ABL-positive, not having achieved remission: Secondary | ICD-10-CM | POA: Diagnosis not present

## 2017-02-24 DIAGNOSIS — F329 Major depressive disorder, single episode, unspecified: Secondary | ICD-10-CM

## 2017-02-24 DIAGNOSIS — K219 Gastro-esophageal reflux disease without esophagitis: Secondary | ICD-10-CM | POA: Diagnosis not present

## 2017-02-24 DIAGNOSIS — I82619 Acute embolism and thrombosis of superficial veins of unspecified upper extremity: Secondary | ICD-10-CM | POA: Diagnosis not present

## 2017-02-24 DIAGNOSIS — Z79899 Other long term (current) drug therapy: Secondary | ICD-10-CM

## 2017-02-24 DIAGNOSIS — N289 Disorder of kidney and ureter, unspecified: Secondary | ICD-10-CM

## 2017-02-24 DIAGNOSIS — R531 Weakness: Secondary | ICD-10-CM | POA: Diagnosis not present

## 2017-02-24 DIAGNOSIS — R188 Other ascites: Secondary | ICD-10-CM | POA: Diagnosis not present

## 2017-02-24 DIAGNOSIS — R161 Splenomegaly, not elsewhere classified: Secondary | ICD-10-CM | POA: Diagnosis not present

## 2017-02-24 DIAGNOSIS — E119 Type 2 diabetes mellitus without complications: Secondary | ICD-10-CM

## 2017-02-24 DIAGNOSIS — E7800 Pure hypercholesterolemia, unspecified: Secondary | ICD-10-CM | POA: Diagnosis not present

## 2017-02-24 DIAGNOSIS — I471 Supraventricular tachycardia: Secondary | ICD-10-CM

## 2017-02-24 DIAGNOSIS — R251 Tremor, unspecified: Secondary | ICD-10-CM | POA: Diagnosis not present

## 2017-02-24 DIAGNOSIS — G4730 Sleep apnea, unspecified: Secondary | ICD-10-CM | POA: Diagnosis not present

## 2017-02-24 DIAGNOSIS — I10 Essential (primary) hypertension: Secondary | ICD-10-CM | POA: Diagnosis not present

## 2017-02-24 DIAGNOSIS — I4891 Unspecified atrial fibrillation: Secondary | ICD-10-CM | POA: Diagnosis not present

## 2017-02-24 LAB — CBC WITH DIFFERENTIAL/PLATELET
BASOS ABS: 0 10*3/uL (ref 0–0.1)
Basophils Relative: 1 %
EOS ABS: 0.1 10*3/uL (ref 0–0.7)
EOS PCT: 3 %
HCT: 27.2 % — ABNORMAL LOW (ref 40.0–52.0)
Hemoglobin: 8.9 g/dL — ABNORMAL LOW (ref 13.0–18.0)
LYMPHS PCT: 16 %
Lymphs Abs: 0.4 10*3/uL — ABNORMAL LOW (ref 1.0–3.6)
MCH: 31.4 pg (ref 26.0–34.0)
MCHC: 32.9 g/dL (ref 32.0–36.0)
MCV: 95.4 fL (ref 80.0–100.0)
MONO ABS: 0.3 10*3/uL (ref 0.2–1.0)
Monocytes Relative: 14 %
Neutro Abs: 1.7 10*3/uL (ref 1.4–6.5)
Neutrophils Relative %: 66 %
PLATELETS: 76 10*3/uL — AB (ref 150–440)
RBC: 2.85 MIL/uL — ABNORMAL LOW (ref 4.40–5.90)
RDW: 17.8 % — AB (ref 11.5–14.5)
WBC: 2.5 10*3/uL — ABNORMAL LOW (ref 3.8–10.6)

## 2017-02-24 LAB — COMPREHENSIVE METABOLIC PANEL
ALT: 14 U/L — ABNORMAL LOW (ref 17–63)
AST: 25 U/L (ref 15–41)
Albumin: 2.6 g/dL — ABNORMAL LOW (ref 3.5–5.0)
Alkaline Phosphatase: 85 U/L (ref 38–126)
Anion gap: 7 (ref 5–15)
BUN: 6 mg/dL (ref 6–20)
CHLORIDE: 108 mmol/L (ref 101–111)
CO2: 23 mmol/L (ref 22–32)
Calcium: 8.3 mg/dL — ABNORMAL LOW (ref 8.9–10.3)
Creatinine, Ser: 1.26 mg/dL — ABNORMAL HIGH (ref 0.61–1.24)
Glucose, Bld: 186 mg/dL — ABNORMAL HIGH (ref 65–99)
POTASSIUM: 3.4 mmol/L — AB (ref 3.5–5.1)
SODIUM: 138 mmol/L (ref 135–145)
Total Bilirubin: 0.5 mg/dL (ref 0.3–1.2)
Total Protein: 5.9 g/dL — ABNORMAL LOW (ref 6.5–8.1)

## 2017-02-24 MED ORDER — IMATINIB MESYLATE 100 MG PO TABS: 100.0000 mg | ORAL_TABLET | Freq: Every day | ORAL | 2 refills | Status: AC

## 2017-02-24 MED ORDER — IMATINIB MESYLATE 100 MG PO TABS: 100.0000 mg | ORAL_TABLET | Freq: Every day | ORAL | 2 refills | Status: DC

## 2017-02-24 MED FILL — IMATINIB MESYLATE 100 MG TA: 100 | 30 days supply | Qty: 30 | Fill #0

## 2017-02-24 NOTE — Telephone Encounter (Signed)
-----   Message from Nickie Retort, MD sent at 02/18/2017  8:30 AM EDT ----- Patient is inpatient with retention. Will need outpatient AM appt for trial of void.

## 2017-02-24 NOTE — Telephone Encounter (Signed)
Oral Chemotherapy Pharmacist Encounter  Sent Gleevec dose reduction prescription to [facility]. Prescrioption was processed with $0 copay.  [facility] [phone]) the new facility where Mr. Temme is staying post hospitalization. Set-up delivery with them. They will need an order in order to administer the [place] to him. I let Tillie Rung, RN know.    Darl Pikes, PharmD, BCPS Hematology/Oncology Clinical Pharmacist [facility] [phone]  02/24/2017 11:59 AM

## 2017-02-24 NOTE — Telephone Encounter (Signed)
done

## 2017-03-04 ENCOUNTER — Encounter: Payer: Self-pay | Admitting: Urology

## 2017-03-04 ENCOUNTER — Ambulatory Visit (INDEPENDENT_AMBULATORY_CARE_PROVIDER_SITE_OTHER): Payer: Self-pay | Admitting: Urology

## 2017-03-04 VITALS — BP 127/85 | HR 82 | Ht 69.0 in

## 2017-03-04 DIAGNOSIS — R339 Retention of urine, unspecified: Secondary | ICD-10-CM

## 2017-03-04 NOTE — Progress Notes (Signed)
Fill and Pull Catheter Removal  Patient is present today for a catheter removal.  Patient was cleaned and prepped in a sterile fashion 327ml of sterile water/ saline was instilled into the bladder when the patient felt the urge to urinate. 45ml of water was then drained from the balloon.  A 16FR foley cath was removed from the bladder no complications were noted .  Patient as then given some time to void on their own.  Patient can void  239ml on their own after some time.  Patient tolerated well.  Preformed by: Elberta Leatherwood, CMA  Follow up/ Additional notes: 4 weeks

## 2017-03-04 NOTE — Progress Notes (Signed)
03/04/2017 9:04 AM   Lawrence Johnston 10-Jun-1960 [id number]  Referring provider: Idelle Crouch, MD [facility] [place], [place] [address]  Chief Complaint  Patient presents with  . Urinary Retention    HPI: The patient is a 56 year old gentleman with no previous GU history and history of CML currently on [place] as well as schizophrenia who was recently admitted to the hospital with worsening weakness and diagnosed with urinary retention.  CT performed prior to Foley placement showed a mildly distended bladder with no evidence of hydroureteronephrosis.  A Foley catheter was in place.  His creatinine at admission was 4 and improved to his baseline of 1.5.  Patient was also started on Flomax at that time.  Patient reports no baseline issues with voiding.  Did have nocturia x3-4.  He feels he empties his bladder with a good stream.  He has never had a Foley catheter placed before.  He is not bothered by his urinary quality of life.   PMH: Past Medical History:  Diagnosis Date  . Arrhythmia    Atrial Fibrillation; Atrial Flutter w/RVR; SVT  . Depression   . Diabetes ([place])   . Dysrhythmia   . GERD (gastroesophageal reflux disease)   . Hypercholesteremia   . Hypertension   . Schizophrenia ([place])   . Sleep apnea   . Tremor     Surgical History: Past Surgical History:  Procedure Laterality Date  . CARDIOVERSION     x2, a-fib  . COLONOSCOPY WITH PROPOFOL N/A 02/25/2015   Procedure: COLONOSCOPY WITH PROPOFOL;  Surgeon: Josefine Class, MD;  Location: [facility] ENDOSCOPY;  Service: Endoscopy;  Laterality: N/A;  . COLONOSCOPY WITH PROPOFOL N/A 04/12/2015   Procedure: COLONOSCOPY WITH PROPOFOL;  Surgeon: Josefine Class, MD;  Location: [facility] ENDOSCOPY;  Service: Endoscopy;  Laterality: N/A;    Home Medications:  Allergies as of 03/04/2017   No Known Allergies     Medication List       Accurate as of 03/04/17  9:04 AM. Always use your most recent  med list.          amiodarone 200 MG tablet Commonly known as:  PACERONE Take 1 tablet (200 mg total) by mouth 2 (two) times daily. X 1 week and then decrease to once a day   benztropine 0.5 MG tablet Commonly known as:  COGENTIN Take 0.5 mg by mouth 2 (two) times daily.   clonazePAM 0.5 MG tablet Commonly known as:  KLONOPIN Take 1 tablet (0.5 mg total) by mouth 2 (two) times daily.   divalproex 500 MG 24 hr tablet Commonly known as:  DEPAKOTE ER Take 2,000 mg by mouth at bedtime.   FLUoxetine 20 MG capsule Commonly known as:  PROZAC Take 20 mg by mouth daily.   gabapentin 300 MG capsule Commonly known as:  NEURONTIN Take 300 mg by mouth at bedtime.   imatinib 100 MG tablet Commonly known as:  GLEEVEC Take 1 tablet (100 mg total) by mouth daily. Take with meals and large glass of water.   metFORMIN 500 MG tablet Commonly known as:  GLUCOPHAGE Take 500 mg by mouth 2 (two) times daily with a meal.   metoprolol succinate 25 MG 24 hr tablet Commonly known as:  TOPROL-XL Take 25 mg by mouth 2 (two) times daily.   multivitamins with iron Tabs tablet Take 1 tablet by mouth daily.   OLANZapine 10 MG tablet Commonly known as:  ZYPREXA Take 1 tablet (10 mg total) by  mouth at bedtime.   pantoprazole 40 MG tablet Commonly known as:  PROTONIX Take 40 mg by mouth daily.   polyethylene glycol packet Commonly known as:  MIRALAX / GLYCOLAX Take 17 g by mouth daily.   QUEtiapine 100 MG tablet Commonly known as:  SEROQUEL Take 1 tablet (100 mg total) by mouth at bedtime.   rOPINIRole 2 MG tablet Commonly known as:  REQUIP Take 2 mg by mouth at bedtime.   rosuvastatin 20 MG tablet Commonly known as:  CRESTOR Take 20 mg by mouth at bedtime.   senna-docusate 8.6-50 MG tablet Commonly known as:  Senokot-S Take 1 tablet by mouth 2 (two) times daily.   tamsulosin 0.4 MG Caps capsule Commonly known as:  FLOMAX Take 1 capsule (0.4 mg total) by mouth daily.        Allergies: No Known Allergies  Family History: History reviewed. No pertinent family history.  Social History:  reports that he has never smoked. He has never used smokeless tobacco. He reports that he does not drink alcohol or use drugs.  ROS: UROLOGY Frequent Urination?: No Hard to postpone urination?: No Burning/pain with urination?: No Get up at night to urinate?: No Leakage of urine?: No Urine stream starts and stops?: No Trouble starting stream?: No Do you have to strain to urinate?: No Blood in urine?: No Urinary tract infection?: No Sexually transmitted disease?: No Injury to kidneys or bladder?: No Painful intercourse?: No Weak stream?: No Erection problems?: No Penile pain?: No  Gastrointestinal Nausea?: No Vomiting?: No Indigestion/heartburn?: No Diarrhea?: No Constipation?: No  Constitutional Fever: No Night sweats?: No Weight loss?: No Fatigue?: No  Skin Skin rash/lesions?: No Itching?: No  Eyes Blurred vision?: No Double vision?: No  Ears/Nose/Throat Sore throat?: No Sinus problems?: No  Hematologic/Lymphatic Swollen glands?: No Easy bruising?: No  Cardiovascular Leg swelling?: No Chest pain?: No  Respiratory Cough?: No Shortness of breath?: No  Endocrine Excessive thirst?: No  Musculoskeletal Back pain?: No Joint pain?: No  Neurological Headaches?: No Dizziness?: No  Psychologic Depression?: No Anxiety?: No  Physical Exam: BP 127/85 (BP Location: Right Arm, Patient Position: Sitting, Cuff Size: Normal)   Pulse 82   Ht 5\' 9"  (1.753 m)   Constitutional:  Alert and oriented, No acute distress. HEENT: [place] AT, moist mucus membranes.  Trachea midline, no masses. Cardiovascular: No clubbing, cyanosis, or edema. Respiratory: Normal respiratory effort, no increased work of breathing. GI: Abdomen is soft, nontender, nondistended, no abdominal masses GU: No CVA tenderness.  Skin: No rashes, bruises or suspicious  lesions. Lymph: No cervical or inguinal adenopathy. Neurologic: Grossly intact, no focal deficits, moving all 4 extremities. Psychiatric: Normal mood and affect.  Laboratory Data: Lab Results  Component Value Date   WBC 2.5 (L) 02/24/2017   HGB 8.9 (L) 02/24/2017   HCT 27.2 (L) 02/24/2017   MCV 95.4 02/24/2017   PLT 76 (L) 02/24/2017    Lab Results  Component Value Date   CREATININE 1.26 (H) 02/24/2017    No results found for: PSA  No results found for: TESTOSTERONE  Lab Results  Component Value Date   HGBA1C 5.8 10/28/2011    Urinalysis    Component Value Date/Time   COLORURINE YELLOW (A) 02/10/2017 1125   APPEARANCEUR CLEAR (A) 02/10/2017 1125   APPEARANCEUR Clear 08/03/2014 2035   LABSPEC 1.017 02/10/2017 1125   LABSPEC 1.020 08/03/2014 2035   PHURINE 5.0 02/10/2017 1125   GLUCOSEU NEGATIVE 02/10/2017 1125   GLUCOSEU Negative 08/03/2014 2035   HGBUR  NEGATIVE 02/10/2017 [place] 02/10/2017 1125   BILIRUBINUR Negative 08/03/2014 2035   KETONESUR NEGATIVE 02/10/2017 [place] 02/10/2017 1125   NITRITE NEGATIVE 02/10/2017 1125   [place] 02/10/2017 1125   LEUKOCYTESUR Negative 08/03/2014 2035     Assessment & Plan:    1.  Acute urinary retention The patient passed his trial of void today when his catheter was removed.  Since this is the first time this has occurred, he can follow-up with [place] as needed.  No Follow-up on file.  Nickie Retort, MD  [facility] [address], [place], [place] [address] [phone]

## 2017-03-09 ENCOUNTER — Telehealth: Payer: Self-pay | Admitting: Pharmacist

## 2017-03-09 NOTE — Telephone Encounter (Signed)
Oral Chemotherapy Pharmacist Encounter  Follow-Up Form  Called patient's current facility today to follow up regarding patient's oral chemotherapy medication: Gleevec (imatinib). Mr. Simenson imatinib was held then started on a lower due to pancytopenia. He has repeat labs schedule for 03/10/17.   Original Start date of oral chemotherapy: 12/22/16  Pt reports 0 tablets/doses of Gleevec missed in the last since restarted last month at lower dose.  Reviewed plan for missed doses.   Spoke with RN taking a reports no side effects since the imatinib was restarted.  Recent labs reviewed: CBC from 02/24/17, pancytopenia has improved compared to 02/19/17 lab work  Other Issues: N/A  Mr. [facility] facility knows to call the office with questions or concerns. Oral Oncology Clinic will continue to follow.  Darl Pikes, PharmD, BCPS Hematology/Oncology Clinical Pharmacist [facility] [phone]  03/09/2017 2:07 PM

## 2017-03-10 ENCOUNTER — Inpatient Hospital Stay: Payer: No Typology Code available for payment source | Attending: Oncology

## 2017-03-10 DIAGNOSIS — C9210 Chronic myeloid leukemia, BCR/ABL-positive, not having achieved remission: Secondary | ICD-10-CM | POA: Insufficient documentation

## 2017-03-10 LAB — CBC WITH DIFFERENTIAL/PLATELET
BASOS PCT: 1 %
Basophils Absolute: 0 10*3/uL (ref 0–0.1)
EOS ABS: 0.1 10*3/uL (ref 0–0.7)
Eosinophils Relative: 4 %
HCT: 28.5 % — ABNORMAL LOW (ref 40.0–52.0)
HEMOGLOBIN: 9.1 g/dL — AB (ref 13.0–18.0)
Lymphocytes Relative: 28 %
Lymphs Abs: 0.5 10*3/uL — ABNORMAL LOW (ref 1.0–3.6)
MCH: 29.8 pg (ref 26.0–34.0)
MCHC: 32.1 g/dL (ref 32.0–36.0)
MCV: 93 fL (ref 80.0–100.0)
MONOS PCT: 24 %
Monocytes Absolute: 0.5 10*3/uL (ref 0.2–1.0)
NEUTROS PCT: 43 %
Neutro Abs: 0.8 10*3/uL — ABNORMAL LOW (ref 1.4–6.5)
Platelets: 85 10*3/uL — ABNORMAL LOW (ref 150–440)
RBC: 3.06 MIL/uL — ABNORMAL LOW (ref 4.40–5.90)
RDW: 17.3 % — AB (ref 11.5–14.5)
WBC: 1.9 10*3/uL — ABNORMAL LOW (ref 3.8–10.6)

## 2017-03-31 ENCOUNTER — Inpatient Hospital Stay: Payer: No Typology Code available for payment source

## 2017-04-04 NOTE — Progress Notes (Deleted)
[place]  [phone] Fax:(336) [phone]  ID: Jackelyn Hoehn OB: Nov 13, 1960  MR#: [id number]  NGE#:[id number]  Patient Care Team: Idelle Crouch, MD as PCP - General (Internal Medicine)  CHIEF COMPLAINT: CML  INTERVAL HISTORY: Patient returns to clinic today for repeat laboratory work, hospital follow-up, and consideration of reinitiating [place]. He currently feels well and is at his baseline. He has a good appetite and is gaining weight. He does not complain of fevers, night sweats, or weight loss. He denies any chest pain or shortness of breath. He has no nausea, vomiting, constipation, or diarrhea. He has no urinary complaints. Patient offers no specific complaints today.  REVIEW OF SYSTEMS:   Review of Systems  Constitutional: Negative.  Negative for chills, diaphoresis, fever, malaise/fatigue and weight loss.  HENT: Negative for congestion.   Respiratory: Negative.  Negative for cough and shortness of breath.   Cardiovascular: Negative.  Negative for chest pain and leg swelling.  Gastrointestinal: Negative.  Negative for abdominal pain.  Genitourinary: Negative.   Musculoskeletal: Negative.   Skin: Negative.  Negative for rash.  Neurological: Negative.  Negative for sensory change and weakness.  Endo/Heme/Allergies: Negative.  Does not bruise/bleed easily.  Psychiatric/Behavioral: Negative.  Negative for depression. The patient is not nervous/anxious.     As per HPI. Otherwise, a complete review of systems is negative.  PAST MEDICAL HISTORY: Past Medical History:  Diagnosis Date  . Arrhythmia    Atrial Fibrillation; Atrial Flutter w/RVR; SVT  . Depression   . Diabetes ([place])   . Dysrhythmia   . GERD (gastroesophageal reflux disease)   . Hypercholesteremia   . Hypertension   . Schizophrenia ([place])   . Sleep apnea   . Tremor     PAST SURGICAL HISTORY: Past Surgical History:  Procedure Laterality Date  . CARDIOVERSION     x2,  a-fib  . COLONOSCOPY WITH PROPOFOL N/A 02/25/2015   Procedure: COLONOSCOPY WITH PROPOFOL;  Surgeon: Josefine Class, MD;  Location[facility] ENDOSCOPY;  Service: Endoscopy;  Laterality: N/A;  . COLONOSCOPY WITH PROPOFOL N/A 04/12/2015   Procedure: COLONOSCOPY WITH PROPOFOL;  Surgeon: Josefine Class, MD;  Location: [facility] ENDOSCOPY;  Service: Endoscopy;  Laterality: N/A;    FAMILY HISTORY: Reviewed and unchanged. No reported history of malignancy or chronic disease.  ADVANCED DIRECTIVES (Y/N):  N  HEALTH MAINTENANCE: Social History   Tobacco Use  . Smoking status: Never Smoker  . Smokeless tobacco: Never Used  Substance Use Topics  . Alcohol use: No  . Drug use: No     Colonoscopy:  PAP:  Bone density:  Lipid panel:  No Known Allergies  Current Outpatient Medications  Medication Sig Dispense Refill  . amiodarone (PACERONE) 200 MG tablet Take 1 tablet (200 mg total) by mouth 2 (two) times daily. X 1 week and then decrease to once a day 40 tablet 0  . benztropine (COGENTIN) 0.5 MG tablet Take 0.5 mg by mouth 2 (two) times daily.    . clonazePAM (KLONOPIN) 0.5 MG tablet Take 1 tablet (0.5 mg total) by mouth 2 (two) times daily. 30 tablet 0  . divalproex (DEPAKOTE ER) 500 MG 24 hr tablet Take 2,000 mg by mouth at bedtime.    Marland Kitchen FLUoxetine (PROZAC) 20 MG capsule Take 20 mg by mouth daily.    Marland Kitchen gabapentin (NEURONTIN) 300 MG capsule Take 300 mg by mouth at bedtime.     Marland Kitchen imatinib (GLEEVEC) 100 MG tablet Take 1 tablet (100 mg total) by mouth daily.  Take with meals and large glass of water. 30 tablet 2  . metFORMIN (GLUCOPHAGE) 500 MG tablet Take 500 mg by mouth 2 (two) times daily with a meal.    . metoprolol succinate (TOPROL-XL) 25 MG 24 hr tablet Take 25 mg by mouth 2 (two) times daily.     . Multiple Vitamins-Iron (MULTIVITAMINS WITH IRON) TABS tablet Take 1 tablet by mouth daily.    Marland Kitchen OLANZapine (ZYPREXA) 10 MG tablet Take 1 tablet (10 mg total) by mouth at bedtime. 30 tablet 2    . pantoprazole (PROTONIX) 40 MG tablet Take 40 mg by mouth daily.    . polyethylene glycol (MIRALAX / GLYCOLAX) packet Take 17 g by mouth daily.    . QUEtiapine (SEROQUEL) 100 MG tablet Take 1 tablet (100 mg total) by mouth at bedtime. 30 tablet 2  . rOPINIRole (REQUIP) 2 MG tablet Take 2 mg by mouth at bedtime.    . rosuvastatin (CRESTOR) 20 MG tablet Take 20 mg by mouth at bedtime.     . senna-docusate (SENOKOT-S) 8.6-50 MG tablet Take 1 tablet by mouth 2 (two) times daily.     . tamsulosin (FLOMAX) 0.4 MG CAPS capsule Take 1 capsule (0.4 mg total) by mouth daily. 30 capsule 2   No current facility-administered medications for this visit.     OBJECTIVE: There were no vitals filed for this visit.   There is no height or weight on file to calculate BMI.    ECOG FS:0 - Asymptomatic  General: Well-developed, well-nourished, no acute distress. Eyes: Pink conjunctiva, anicteric sclera. Lungs: Clear to auscultation bilaterally. Heart: Regular rate and rhythm. No rubs, murmurs, or gallops. Abdomen: Soft, nontender, nondistended. No organomegaly noted, normoactive bowel sounds. Musculoskeletal: No edema, cyanosis, or clubbing. Neuro: Alert, answering all questions appropriately. Cranial nerves grossly intact. Skin: No rashes or petechiae noted. Psych: Normal affect.  LAB RESULTS:  Lab Results  Component Value Date   NA 138 02/24/2017   K 3.4 (L) 02/24/2017   CL 108 02/24/2017   CO2 23 02/24/2017   GLUCOSE 186 (H) 02/24/2017   BUN 6 02/24/2017   CREATININE 1.26 (H) 02/24/2017   CALCIUM 8.3 (L) 02/24/2017   PROT 5.9 (L) 02/24/2017   ALBUMIN 2.6 (L) 02/24/2017   AST 25 02/24/2017   ALT 14 (L) 02/24/2017   ALKPHOS 85 02/24/2017   BILITOT 0.5 02/24/2017   GFRNONAA >60 02/24/2017   GFRAA >60 02/24/2017    Lab Results  Component Value Date   WBC 1.9 (L) 03/10/2017   NEUTROABS 0.8 (L) 03/10/2017   HGB 9.1 (L) 03/10/2017   HCT 28.5 (L) 03/10/2017   MCV 93.0 03/10/2017   PLT 85  (L) 03/10/2017     STUDIES: No results found.  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 BCR-ABL was trending down prior to discontinuing Gleevec secondary to severe pancytopenia. Previously, Tasigna was discontinued secondary to persistent pancytopenia despite dose reducing. He still has pancytopenia, but significantly improved since his hospitalization. Because there was a positive effect with Gleevec, will reinitiate treatment at 100 mg per day and increase the dose as tolerated. Return to clinic in 2 weeks and 5 weeks for laboratory work and then in 6 weeks for further evaluation.  It is possible his pancytopenia and difficulty with treatment is secondary to a drug interaction with one of the multiple medications that he is taking (Prozac, Seroquel, Ingrezza, and/or Flupenazine all possibilities.)   2. Pancytopenia: Significantly improved.  Discontinue Tasigna as above. Gleevec 100 mg daily. 3. Renal insufficiency: Patient's creatinine is mildly elevated today.  Approximately 30 minutes was spent in discussion of which 50% was consultation.   Patient expressed understanding and was in agreement with this plan. He also understands that He can call clinic at any time with any questions, concerns, or complaints.    Lloyd Huger, MD 04/04/17 10:57 AM

## 2017-04-07 ENCOUNTER — Inpatient Hospital Stay: Payer: No Typology Code available for payment source | Admitting: Oncology

## 2017-04-07 ENCOUNTER — Telehealth: Payer: Self-pay | Admitting: Oncology

## 2017-04-07 NOTE — Telephone Encounter (Signed)
Ok, thanks.

## 2017-04-07 NOTE — Telephone Encounter (Signed)
Left message for Richrd Sox at care home to see if patient would be returning. According to one of the staff members he is seeing someone, but she needed for Vivien Rota to confirm.

## 2017-04-09 MED FILL — IMATINIB MESYLATE 100 MG TA: 100 | 30 days supply | Qty: 30 | Fill #1

## 2017-07-15 IMAGING — CT CT BIOPSY
1 of 2 series · 8 of 14 positions shown, 10 images · non-contrast
Comparison: none

INDICATION: Elevated white blood cell count.

[Series 2: i-spiral 5.0 b30f · axial · 0.62mm/px · z∈[+732,+844]mm · 8 of 42 slices shown, 10 images]
[im 5/42  soft-tissue]
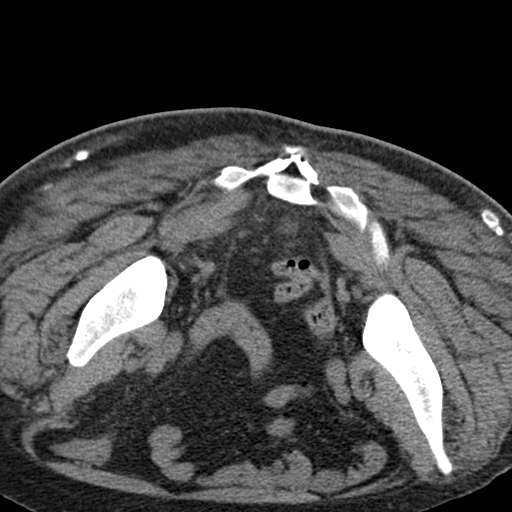
[im 5/42  bone]
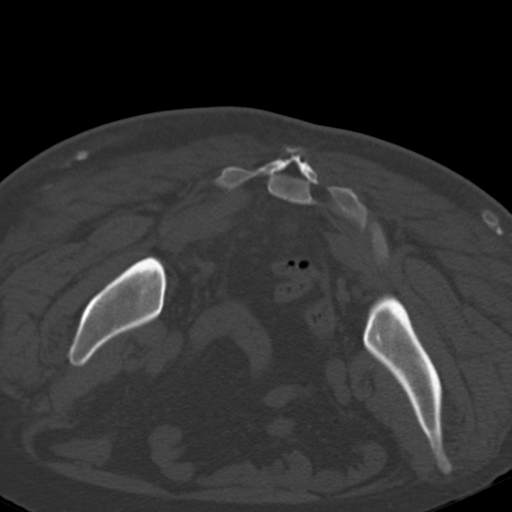
[im 10/42  bone]
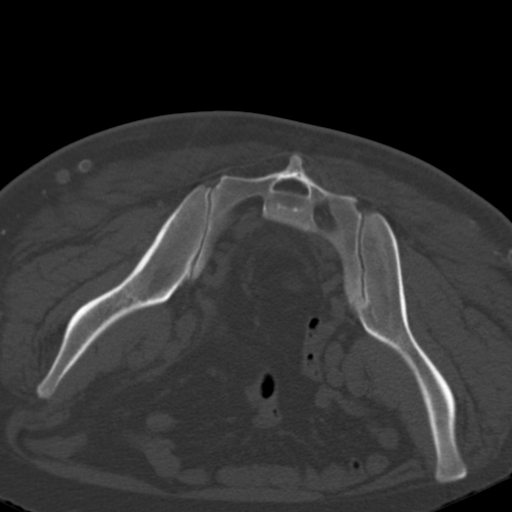
[im 14/42  bone]
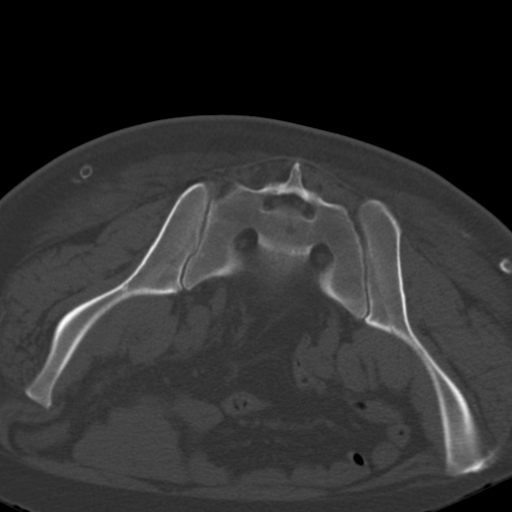
[im 19/42  bone]
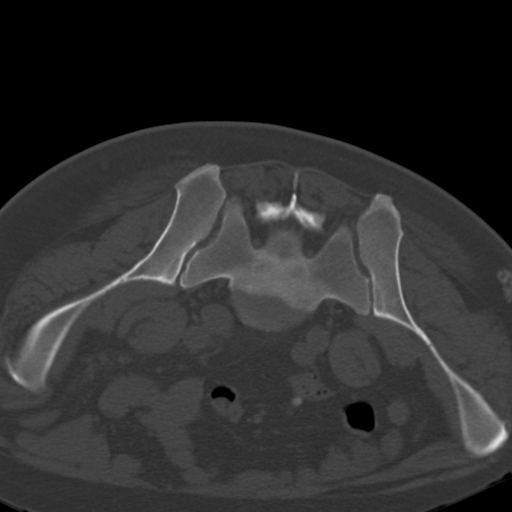
[im 23/42  soft-tissue]
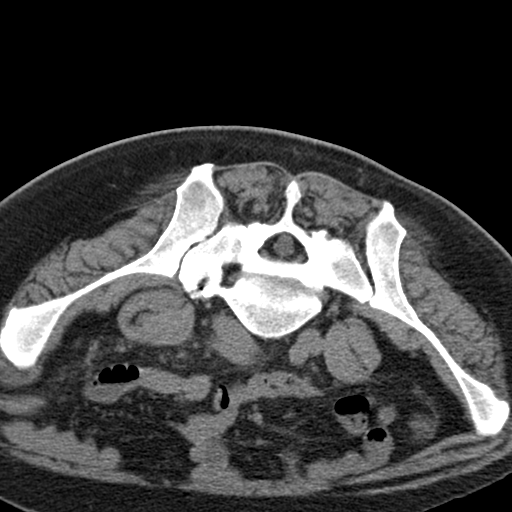
[im 23/42  bone]
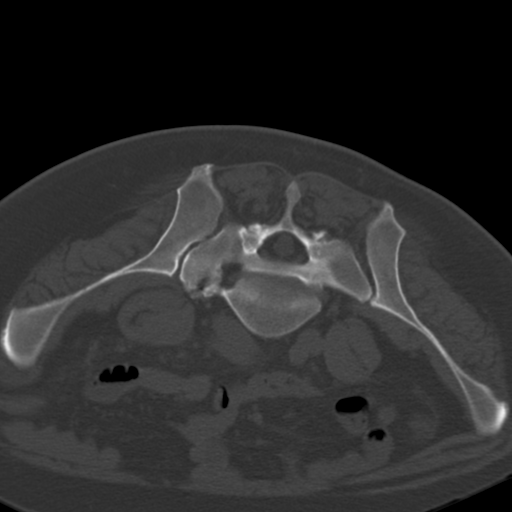
[im 28/42  bone]
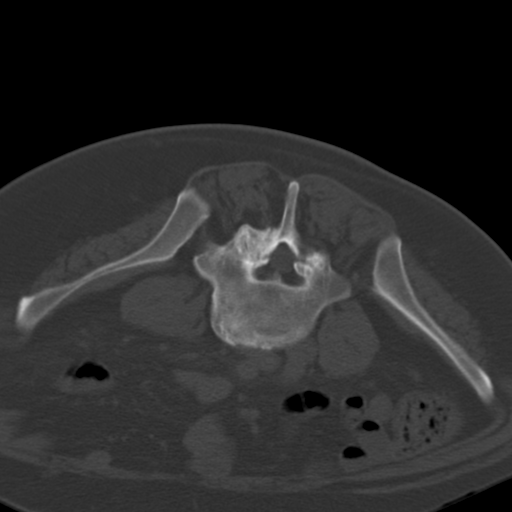
[im 32/42  bone]
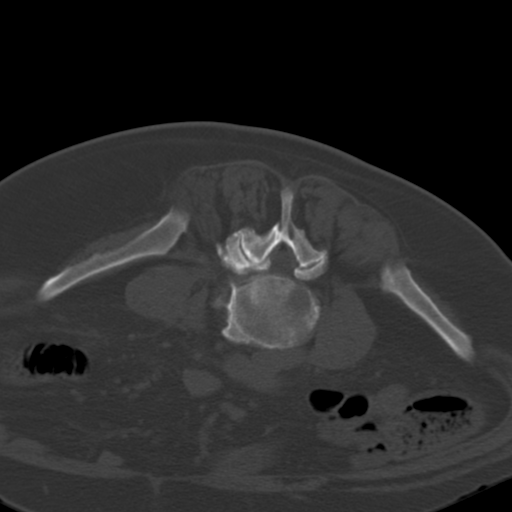
[im 37/42  bone]
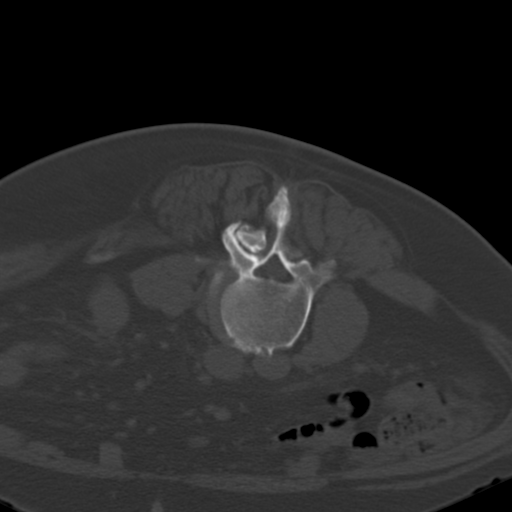

[8 of 14 positions shown; findings below may reference images not displayed]

EXAM:
CT BIOPSY

MEDICATIONS:
None.

ANESTHESIA/SEDATION:
Moderate (conscious) sedation was employed during this procedure. A
total of Versed 2.0 mg and Fentanyl 100 mcg was administered
intravenously.

Moderate Sedation Time: 15 minutes. The patient's level of
consciousness and vital signs were monitored continuously by
radiology nursing throughout the procedure under my direct
supervision.

COMPLICATIONS:
None immediate.

PROCEDURE:
Informed written consent was obtained from the patient after a
thorough discussion of the procedural risks, benefits and
alternatives. All questions were addressed. Sterile Barrier
Technique was utilized including mask, sterile gloves, sterile
drape, hand hygiene and skin antiseptic. A timeout was performed
prior to the initiation of the procedure.

Patient was placed prone on CT scanner. Posterior flank region was
prepped and draped using sterile technique. Local anesthetic was
applied. Under CT guidance, 22 gauge spinal needle was directed
toward posterior margin of left iliac bone. Lidocaine was
administered subperiosteally. Needle was removed. Then, also under
CT guidance, trocar was directed through posterior margin of left
iliac bone. Bone marrow aspirates were obtained with and without
heparin. Then, bone marrow core biopsy was obtained. Core biopsy and
aspirates were given to pathology technologist who was present at
that time. Needle was removed and appropriate dressing was applied.
IMPRESSION: Under CT guidance, successful bone marrow aspiration and core biopsy
of left iliac bone was performed.

## 2018-06-02 IMAGING — US US EXTREM  UP VENOUS*L*
1 series · 13 of 24 positions shown · non-contrast
Comparison: None.

CLINICAL DATA: Left upper extremity pain and redness



[Series 1: us extrem up venous*left* · 0.08mm/px · 13 of 32 slices shown]
[im 1/32]
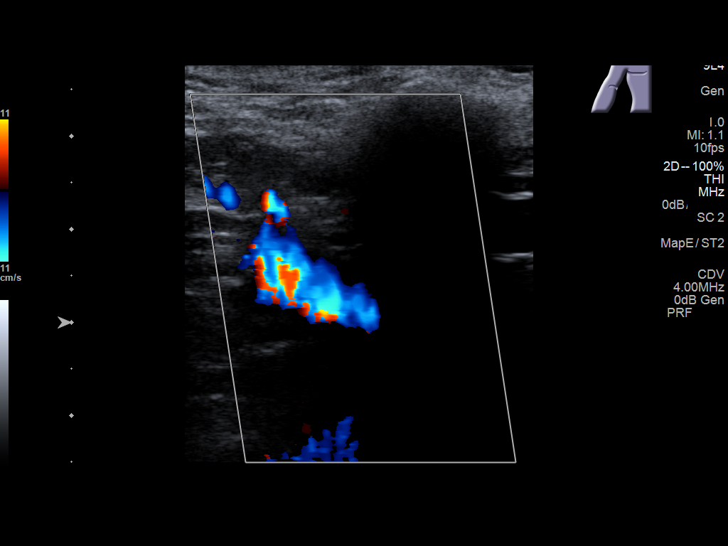
[im 3/32]
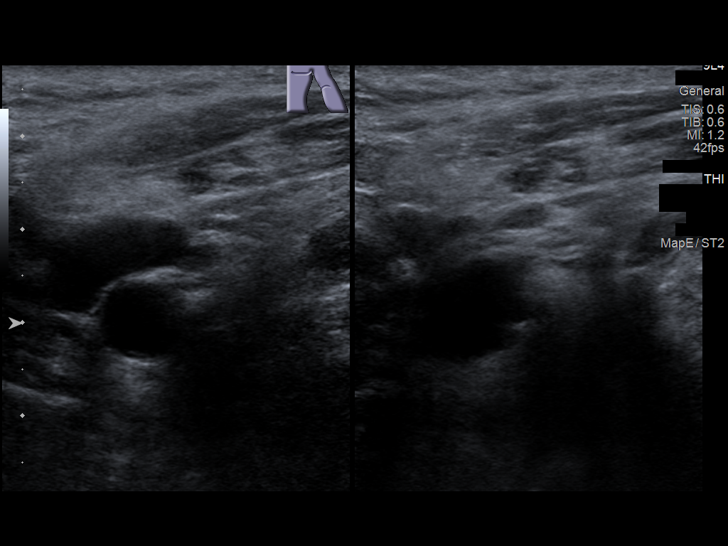
[im 6/32]
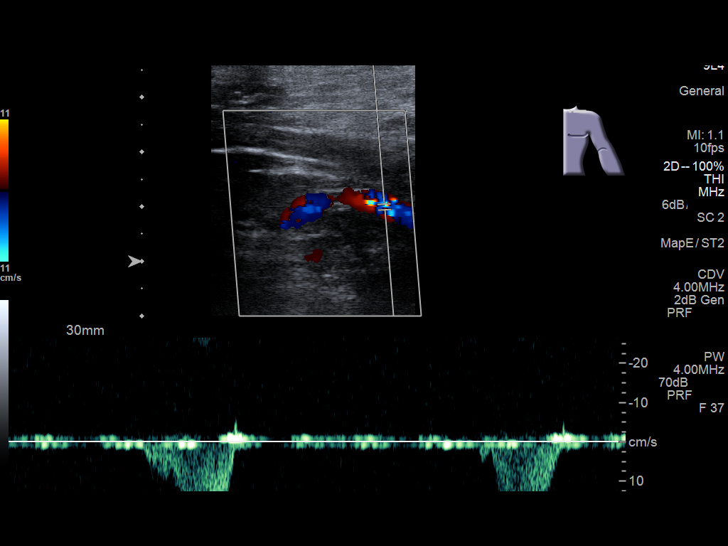
[im 9/32]
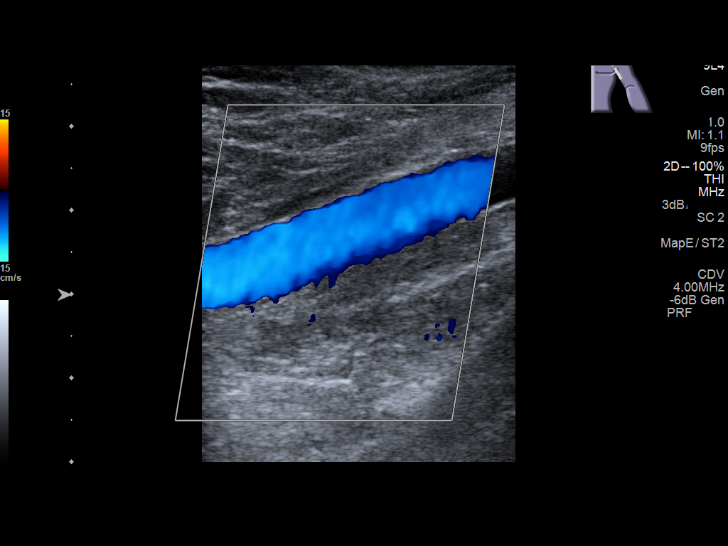
[im 11/32]
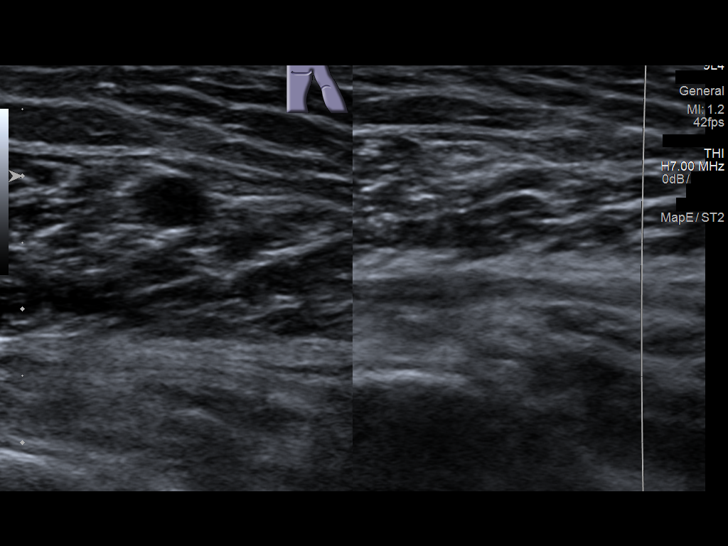
[im 14/32]
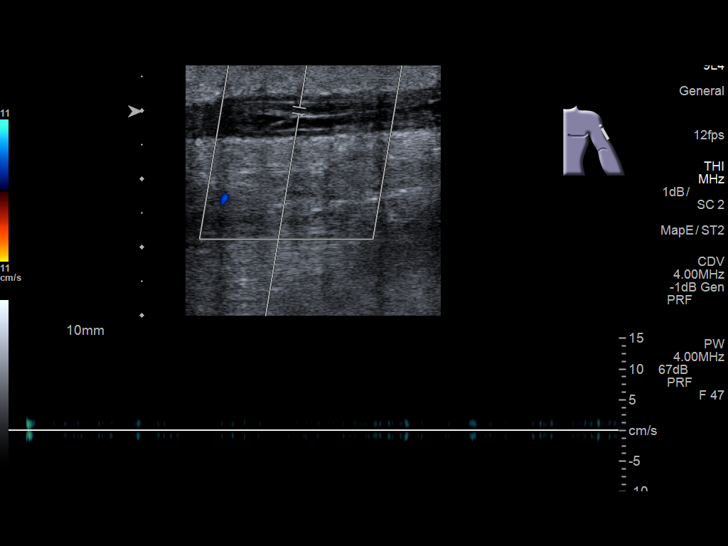
[im 17/32]
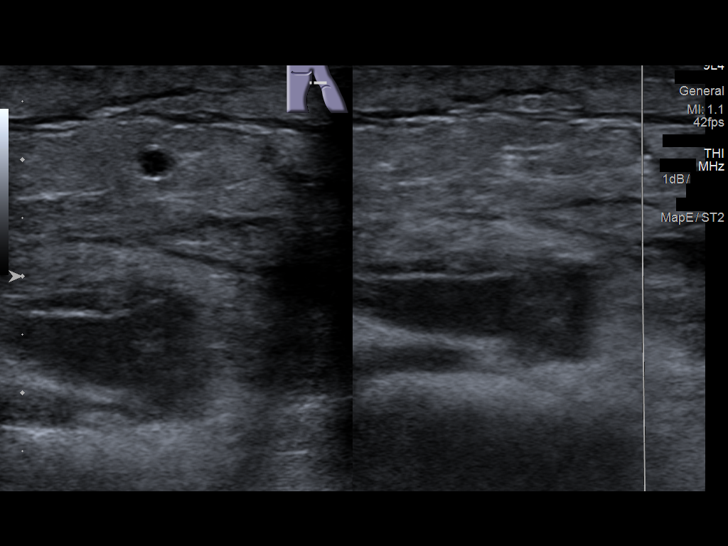
[im 18/32]
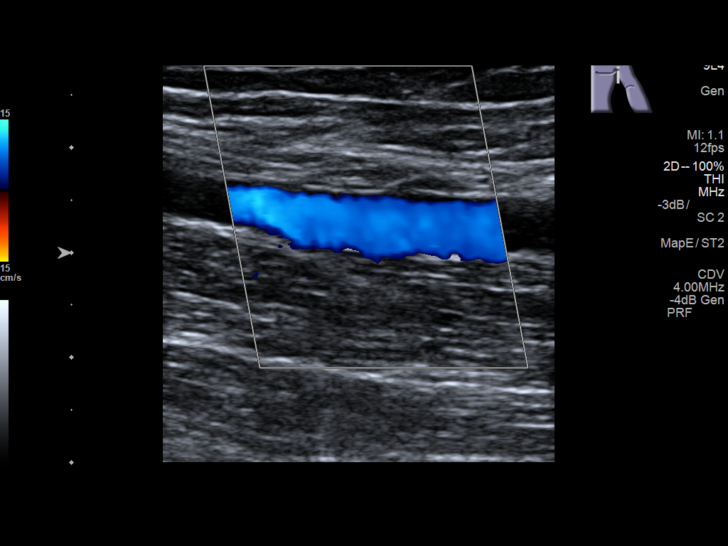
[im 21/32]
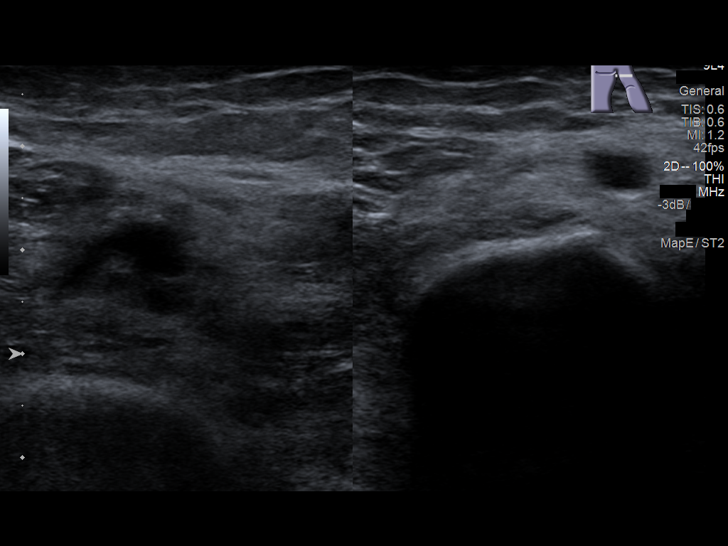
[im 23/32]
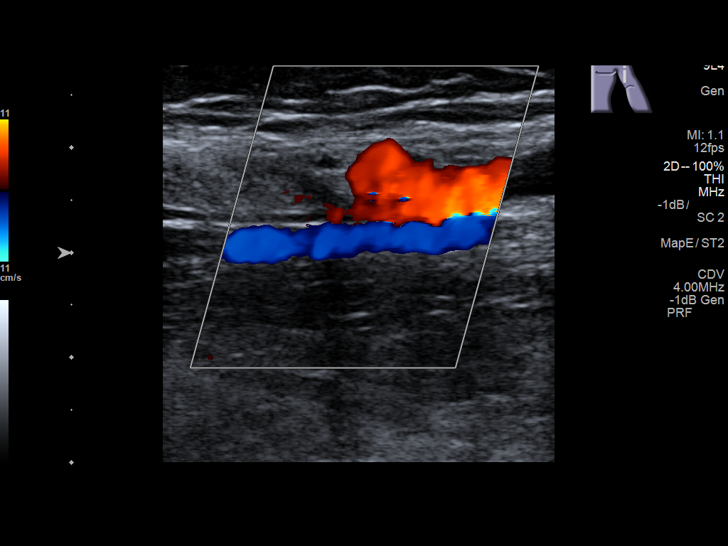
[im 26/32]
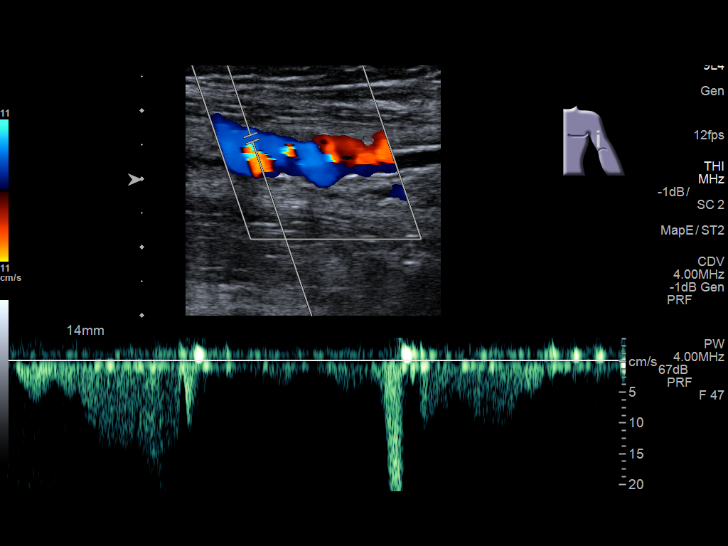
[im 29/32]
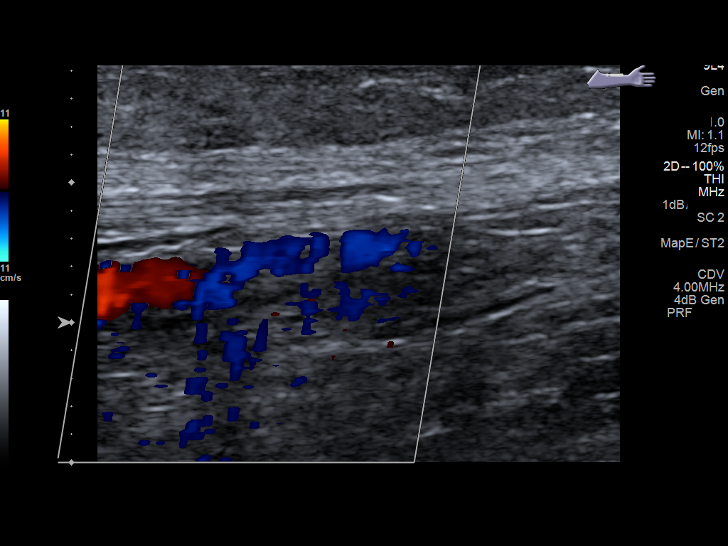
[im 32/32]
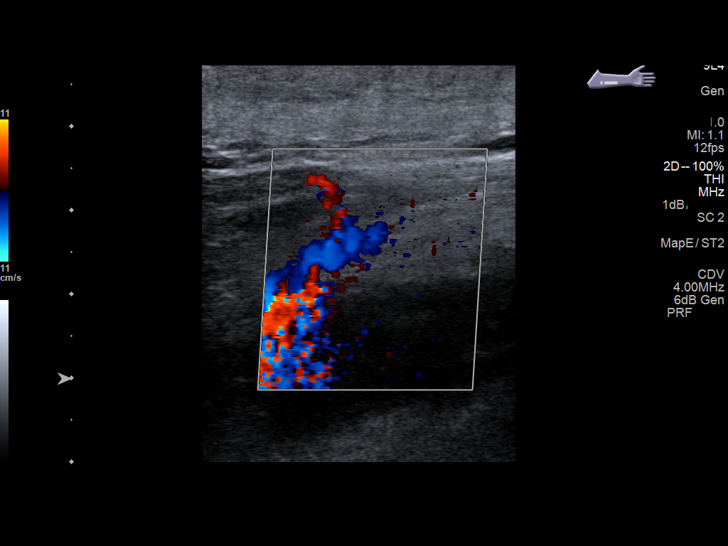

[13 of 24 positions shown; findings below may reference images not displayed]

FINDINGS: Contralateral Subclavian Vein: Respiratory phasicity is normal and
symmetric with the symptomatic side. No evidence of thrombus. Normal
compressibility.

Internal Jugular Vein: No evidence of thrombus. Normal
compressibility, respiratory phasicity and response to augmentation.

Subclavian Vein: No evidence of thrombus. Normal compressibility,
respiratory phasicity and response to augmentation.

Axillary Vein: No evidence of thrombus. Normal compressibility,
respiratory phasicity and response to augmentation.

Cephalic Vein: Occlusive thrombus in the mid to distal segments.

Basilic Vein: No evidence of thrombus. Normal compressibility,
respiratory phasicity and response to augmentation.

Brachial Veins: No evidence of thrombus. Normal compressibility,
respiratory phasicity and response to augmentation.

Radial Veins: No evidence of thrombus. Normal compressibility,
respiratory phasicity and response to augmentation.

Ulnar Veins: No evidence of thrombus. Normal compressibility,
respiratory phasicity and response to augmentation.

Venous Reflux:  None visualized.

Other Findings:  None visualized.
IMPRESSION: 1. No evidence of DVT within the left upper extremity.
2. Occlusive thrombus in the mid to distal cephalic vein.
